# Patient Record
Sex: Male | Born: 1980
Health system: Southern US, Community
[De-identification: ages and names within clinical notes are randomized; demographics above are authoritative.]

## PROBLEM LIST (undated history)

## (undated) DIAGNOSIS — D649 Anemia, unspecified: Secondary | ICD-10-CM

## (undated) DIAGNOSIS — F410 Panic disorder [episodic paroxysmal anxiety] without agoraphobia: Secondary | ICD-10-CM

## (undated) DIAGNOSIS — I10 Essential (primary) hypertension: Secondary | ICD-10-CM

## (undated) DIAGNOSIS — F32A Depression, unspecified: Secondary | ICD-10-CM

## (undated) DIAGNOSIS — K859 Acute pancreatitis without necrosis or infection, unspecified: Secondary | ICD-10-CM

## (undated) DIAGNOSIS — K219 Gastro-esophageal reflux disease without esophagitis: Secondary | ICD-10-CM

## (undated) DIAGNOSIS — F329 Major depressive disorder, single episode, unspecified: Secondary | ICD-10-CM

## (undated) DIAGNOSIS — N179 Acute kidney failure, unspecified: Secondary | ICD-10-CM

## (undated) HISTORY — PX: UPPER GI ENDOSCOPY: SHX6162

## (undated) HISTORY — DX: Panic disorder (episodic paroxysmal anxiety): F41.0

## (undated) HISTORY — DX: Essential (primary) hypertension: I10

## (undated) NOTE — *Deleted (*Deleted)
Daily Rounding Note  07/29/2020, 10:24 AM  LOS: 1 day   SUBJECTIVE:   Chief complaint:     ***  OBJECTIVE:         Vital signs in last 24 hours:    Temp:  [98.4 F (36.9 C)-99.4 F (37.4 C)] 98.4 F (36.9 C) (12/01 0522) Pulse Rate:  [96-106] 96 (12/01 0600) Resp:  [18] 18 (12/01 0522) BP: (126-146)/(62-96) 127/62 (12/01 0600) SpO2:  [95 %-98 %] 98 % (12/01 0522)   Filed Weights   07/27/20 0858  Weight: 93.4 kg   General: ***   Heart: *** Chest: *** Abdomen: ***  Extremities: *** Neuro/Psych:  ***  Intake/Output from previous day: 11/30 0701 - 12/01 0700 In: 240 [P.O.:240] Out: 600 [Urine:600]  Intake/Output this shift: No intake/output data recorded.  Lab Results: Recent Labs    07/27/20 0915 07/27/20 0915 07/27/20 1226 07/28/20 0717 07/29/20 0254  WBC 8.0  --   --  7.9 9.9  HGB 18.4*   < > 17.7* 13.7 13.8  HCT 53.2*   < > 52.0 40.1 39.3  PLT 268  --   --  157 116*   < > = values in this interval not displayed.   BMET Recent Labs    07/27/20 0915 07/27/20 0915 07/27/20 1226 07/28/20 0717 07/29/20 0254  NA 135   < > 136 127* 134*  K 4.7   < > 5.7* 3.9 4.0  CL 102  --   --  97* 98  CO2 17*  --   --  23 25  GLUCOSE 165*  --   --  176* 185*  BUN 29*  --   --  12 7  CREATININE 1.59*  --   --  1.22 1.27*  CALCIUM 8.9  --   --  8.4* 8.7*   < > = values in this interval not displayed.   LFT Recent Labs    07/27/20 0915 07/28/20 0717 07/29/20 0254  PROT 7.3 5.9* 6.0*  ALBUMIN 4.1 3.3* 3.0*  AST 940* 513* 232*  ALT 492* 396* 249*  ALKPHOS 134* 91 81  BILITOT 2.2* 3.1* 2.9*  BILIDIR  --  1.4*  --    PT/INR Recent Labs    07/27/20 1428 07/28/20 0717  LABPROT 20.0* 16.0*  INR 1.8* 1.3*   Hepatitis Panel Recent Labs    07/27/20 1129 07/27/20 1129 07/27/20 1428  HEPBSAG NON REACTIVE  --   --   HCVAB NON REACTIVE   < > NON REACTIVE  HEPAIGM NON REACTIVE   < > NON  REACTIVE  HEPBIGM NON REACTIVE   < > NON REACTIVE   < > = values in this interval not displayed.    Studies/Results: CT Abdomen Pelvis W Contrast  Result Date: 07/27/2020 CLINICAL DATA:  Epigastric pain with nausea beginning around midnight last night. EXAM: CT ABDOMEN AND PELVIS WITH CONTRAST TECHNIQUE: Multidetector CT imaging of the abdomen and pelvis was performed using the standard protocol following bolus administration of intravenous contrast. CONTRAST:  OMNIPAQUE IOHEXOL 300 MG/ML  SOLN COMPARISON:  02/16/2018 FINDINGS: Lower chest: Clear lung bases.  Heart normal in size. Hepatobiliary: Liver normal in size. Diffuse decreased attenuation of the liver consistent with fatty infiltration. Fatty sparing noted adjacent to the gallbladder and anterior superior liver. No liver mass. Normal gallbladder. No bile duct dilation Pancreas: Cyst lies along the pancreatic tail adjacent to the spleen measuring 2.2 x 1.7 x 2.4  cm. No other pancreatic masses. Duct dilation or active inflammation. Spleen: Normal in size without focal abnormality. Adrenals/Urinary Tract: No adrenal masses. Kidneys normal in size, orientation and position with symmetric enhancement. Subtle low-attenuation area in the upper pole suggests a cysts common 9 mm in size. No other masses, no stones and no hydronephrosis. Normal ureters. Normal bladder. Stomach/Bowel: Stomach is within normal limits. Appendix appears normal. No evidence of bowel wall thickening, distention, or inflammatory changes. Vascular/Lymphatic: No significant vascular findings are present. No enlarged abdominal or pelvic lymph nodes. Reproductive: Unremarkable. Other: No abdominal wall hernia or abnormality. No abdominopelvic ascites. Musculoskeletal: No fracture or acute finding. No osteoblastic or osteolytic lesions. IMPRESSION: 1. No acute findings.  No evidence of active pancreatitis. 2. 2.4 cm cyst lies between the pancreatic tail and spleen consistent with a  pseudocyst given the patient's history of pancreatitis, new since the prior CT. Recommend follow-up CT in 6-12 months to assess for stability. 3. Extensive hepatic steatosis. Electronically Signed   By: Amie Portland M.D.   On: 07/27/2020 12:09   MR 3D Recon At Scanner  Result Date: 07/27/2020 CLINICAL DATA:  Fatty liver. EXAM: MRI ABDOMEN WITHOUT AND WITH CONTRAST (INCLUDING MRCP) TECHNIQUE: Multiplanar multisequence MR imaging of the abdomen was performed both before and after the administration of intravenous contrast. Heavily T2-weighted images of the biliary and pancreatic ducts were obtained, and three-dimensional MRCP images were rendered by post processing. CONTRAST:  9mL GADAVIST GADOBUTROL 1 MMOL/ML IV SOLN COMPARISON:  CT scan 07/27/2020 FINDINGS: Lower chest: Unremarkable. Hepatobiliary: Diffuse loss of signal intensity in the liver parenchyma on out of phase T1 imaging is consistent with fatty deposition. There is some sparing along the gallbladder fossa. There is no evidence for gallstones, gallbladder wall thickening, or pericholecystic fluid. No intrahepatic or extrahepatic biliary dilation. No choledocholithiasis. Pancreas: No focal mass lesion. No dilatation of the main duct. No peripancreatic edema. Cystic lesion at the tail of pancreas/splenic hilum is obscured by susceptibility artifact from metallic fragment in the left abdomen. Spleen:  Obscured. Adrenals/Urinary Tract: No adrenal nodule or mass. Right kidney unremarkable. Left kidney largely obscured by susceptibility artifact. Stomach/Bowel: Stomach is unremarkable. No gastric wall thickening. No evidence of outlet obstruction. Duodenum is normally positioned as is the ligament of Treitz. No small bowel or colonic dilatation within the visualized abdomen. Vascular/Lymphatic: No abdominal aortic aneurysm. There is no gastrohepatic or hepatoduodenal ligament lymphadenopathy. No retroperitoneal or mesenteric lymphadenopathy. Other:  No  intraperitoneal free fluid. Musculoskeletal: No focal suspicious marrow enhancement within the visualized bony anatomy. IMPRESSION: 1. Hepatic steatosis. 2. Cystic lesion at the tail of pancreas/splenic hilum seen on CT earlier today is obscured by susceptibility artifact from metallic fragment in the left abdomen. 3. No evidence for cholelithiasis.  No biliary dilation. Electronically Signed   By: Kennith Center M.D.   On: 07/27/2020 18:06   MR ABDOMEN MRCP W WO CONTAST  Result Date: 07/27/2020 CLINICAL DATA:  Fatty liver. EXAM: MRI ABDOMEN WITHOUT AND WITH CONTRAST (INCLUDING MRCP) TECHNIQUE: Multiplanar multisequence MR imaging of the abdomen was performed both before and after the administration of intravenous contrast. Heavily T2-weighted images of the biliary and pancreatic ducts were obtained, and three-dimensional MRCP images were rendered by post processing. CONTRAST:  9mL GADAVIST GADOBUTROL 1 MMOL/ML IV SOLN COMPARISON:  CT scan 07/27/2020 FINDINGS: Lower chest: Unremarkable. Hepatobiliary: Diffuse loss of signal intensity in the liver parenchyma on out of phase T1 imaging is consistent with fatty deposition. There is some sparing along the gallbladder fossa.  There is no evidence for gallstones, gallbladder wall thickening, or pericholecystic fluid. No intrahepatic or extrahepatic biliary dilation. No choledocholithiasis. Pancreas: No focal mass lesion. No dilatation of the main duct. No peripancreatic edema. Cystic lesion at the tail of pancreas/splenic hilum is obscured by susceptibility artifact from metallic fragment in the left abdomen. Spleen:  Obscured. Adrenals/Urinary Tract: No adrenal nodule or mass. Right kidney unremarkable. Left kidney largely obscured by susceptibility artifact. Stomach/Bowel: Stomach is unremarkable. No gastric wall thickening. No evidence of outlet obstruction. Duodenum is normally positioned as is the ligament of Treitz. No small bowel or colonic dilatation within the  visualized abdomen. Vascular/Lymphatic: No abdominal aortic aneurysm. There is no gastrohepatic or hepatoduodenal ligament lymphadenopathy. No retroperitoneal or mesenteric lymphadenopathy. Other:  No intraperitoneal free fluid. Musculoskeletal: No focal suspicious marrow enhancement within the visualized bony anatomy. IMPRESSION: 1. Hepatic steatosis. 2. Cystic lesion at the tail of pancreas/splenic hilum seen on CT earlier today is obscured by susceptibility artifact from metallic fragment in the left abdomen. 3. No evidence for cholelithiasis.  No biliary dilation. Electronically Signed   By: Kennith Center M.D.   On: 07/27/2020 18:06   US Abdomen Limited RUQ (LIVER/GB)  Result Date: 07/27/2020 CLINICAL DATA:  Elevated liver function tests. EXAM: ULTRASOUND ABDOMEN LIMITED RIGHT UPPER QUADRANT COMPARISON:  February 16, 2018. FINDINGS: Gallbladder: No gallstones or wall thickening visualized. No sonographic Murphy sign noted by sonographer. Common bile duct: Diameter: 4 mm which is within normal limits Liver: No focal lesion identified. Increased echogenicity of hepatic parenchyma is noted suggesting hepatic steatosis. Portal vein is patent on color Doppler imaging with normal direction of blood flow towards the liver. Other: None. IMPRESSION: Hepatic steatosis. No other definite abnormality seen in the right upper quadrant of the abdomen. Electronically Signed   By: Lupita Raider M.D.   On: 07/27/2020 11:41    ASSESMENT:   *    Abdominal pain.  Elevated LFTs.  Tail of pancreas pseudocyst. Transaminase elevation above levels consistent with alcoholic hepatitis alone. 11/29 MRCP showing hepatic steatosis, pancreatic pseudocyst, no cholelithiasis. Suspect patient drinks more than he admits to, was drinking more than usual over Thanksgiving holiday in the week prior to admission. T bili, transaminases continue to improve.  Lipase 56 at admission. Smooth muscle antibody negative.  ANA negative.  Hep ABC  viral serologies negative. Immunoglobulins, QN, A/E/G/M are pending.  *    Hyponatremia.  Improved, 127 >> 134.  *     Coagulopathy.  INR 1.8 >> vitamin K >> 1.3.  *     Thrombocytopenia.  Started at 268 >> 116 today.  *    Panic disorder, multiple meds in place.   PLAN   *    Dr. Judie Petit considering the role of EGD/EUS to rule out microcholedocholithiasis, microlithiasis.  However no firm plans for this to be done as an inpatient.    Jennye Moccasin  07/29/2020, 10:24 AM Phone [phone]

---
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
## 2000-04-07 ENCOUNTER — Encounter: Payer: Self-pay | Admitting: Internal Medicine

## 2000-04-07 ENCOUNTER — Encounter: Admission: RE | Admit: 2000-04-07 | Discharge: 2000-04-07 | Payer: Self-pay | Admitting: Internal Medicine

## 2000-08-29 HISTORY — PX: ABDOMINAL EXPLORATION SURGERY: SHX538

## 2001-06-09 ENCOUNTER — Inpatient Hospital Stay (HOSPITAL_COMMUNITY): Admission: EM | Admit: 2001-06-09 | Discharge: 2001-06-14 | Payer: Self-pay | Admitting: Emergency Medicine

## 2001-06-09 ENCOUNTER — Encounter: Payer: Self-pay | Admitting: Emergency Medicine

## 2001-06-09 ENCOUNTER — Encounter: Payer: Self-pay | Admitting: Surgery

## 2001-06-10 ENCOUNTER — Encounter: Payer: Self-pay | Admitting: Surgery

## 2012-08-21 ENCOUNTER — Encounter (HOSPITAL_COMMUNITY): Payer: Self-pay | Admitting: Emergency Medicine

## 2012-08-21 ENCOUNTER — Emergency Department (HOSPITAL_COMMUNITY): Payer: 59

## 2012-08-21 ENCOUNTER — Emergency Department (HOSPITAL_COMMUNITY)
Admission: EM | Admit: 2012-08-21 | Discharge: 2012-08-21 | Disposition: A | Discharge status: Home / Self Care | Payer: 59 | Attending: Emergency Medicine | Admitting: Emergency Medicine

## 2012-08-21 DIAGNOSIS — R509 Fever, unspecified: Secondary | ICD-10-CM | POA: Insufficient documentation

## 2012-08-21 DIAGNOSIS — K7689 Other specified diseases of liver: Secondary | ICD-10-CM | POA: Insufficient documentation

## 2012-08-21 DIAGNOSIS — R630 Anorexia: Secondary | ICD-10-CM | POA: Insufficient documentation

## 2012-08-21 DIAGNOSIS — K760 Fatty (change of) liver, not elsewhere classified: Secondary | ICD-10-CM

## 2012-08-21 DIAGNOSIS — A088 Other specified intestinal infections: Secondary | ICD-10-CM | POA: Insufficient documentation

## 2012-08-21 DIAGNOSIS — R112 Nausea with vomiting, unspecified: Secondary | ICD-10-CM | POA: Insufficient documentation

## 2012-08-21 DIAGNOSIS — A084 Viral intestinal infection, unspecified: Secondary | ICD-10-CM

## 2012-08-21 LAB — COMPREHENSIVE METABOLIC PANEL
ALT: 33 U/L (ref 0–53)
AST: 26 U/L (ref 0–37)
Alkaline Phosphatase: 67 U/L (ref 39–117)
GFR calc Af Amer: >90 mL/min (ref >90)
Glucose, Bld: 96 mg/dL (ref 70–99)
Potassium: 4.2 mEq/L (ref 3.5–5.1)
Sodium: 140 mEq/L (ref 135–145)
Total Protein: 7.5 g/dL (ref 6.0–8.3)

## 2012-08-21 LAB — CBC WITH DIFFERENTIAL/PLATELET
Basophils Absolute: 0 10*3/uL (ref 0.0–0.1)
Basophils Relative: 0 % (ref 0–1)
Eosinophils Absolute: 0.2 10*3/uL (ref 0.0–0.7)
Eosinophils Relative: 2 % (ref 0–5)
HCT: 49 % (ref 39.0–52.0)
Hemoglobin: 17 g/dL (ref 13.0–17.0)
Lymphocytes Relative: 32 % (ref 12–46)
Lymphs Abs: 2.8 10*3/uL (ref 0.7–4.0)
MCH: 29.6 pg (ref 26.0–34.0)
MCHC: 34.7 g/dL (ref 30.0–36.0)
MCV: 85.4 fL (ref 78.0–100.0)
Monocytes Absolute: 0.6 10*3/uL (ref 0.1–1.0)
Monocytes Relative: 7 % (ref 3–12)
Neutro Abs: 5.1 10*3/uL (ref 1.7–7.7)
Neutrophils Relative %: 59 % (ref 43–77)
Platelets: 228 10*3/uL (ref 150–400)
RBC: 5.74 MIL/uL (ref 4.22–5.81)
RDW: 12.8 % (ref 11.5–15.5)
WBC: 8.7 10*3/uL (ref 4.0–10.5)

## 2012-08-21 MED ORDER — KETOROLAC TROMETHAMINE 30 MG/ML IJ SOLN
30.0000 mg | Freq: Once | INTRAMUSCULAR | Status: AC
  Administered 2012-08-21: 30 mg via INTRAVENOUS
  Filled 2012-08-21: qty 1

## 2012-08-21 MED ORDER — ONDANSETRON HCL 4 MG PO TABS: 4.0000 mg | ORAL_TABLET | Freq: Four times a day (QID) | ORAL | Status: DC

## 2012-08-21 MED ORDER — SODIUM CHLORIDE 0.9 % IV BOLUS (SEPSIS)
1000.0000 mL | Freq: Once | INTRAVENOUS | Status: AC
  Administered 2012-08-21: 1000 mL via INTRAVENOUS

## 2012-08-21 NOTE — ED Notes (Signed)
Discharge instructions reviewed. Pt verbalized understanding.  

## 2012-08-21 NOTE — ED Notes (Signed)
Patient transported to Ultrasound 

## 2012-08-21 NOTE — ED Notes (Addendum)
Pt states that he is having mid abd pain since Sunday. Describes it as "Like Knives" rates pain 7/10. Denies diarrhea, but states he vomited x 1 in last 24hrs. Pt states he has taken ibuprofen for pain.

## 2012-08-21 NOTE — ED Provider Notes (Signed)
Medical screening examination/treatment/procedure(s) were performed by non-physician practitioner and as supervising physician I was immediately available for consultation/collaboration.  Shelda Jakes, MD 08/21/12 1131

## 2012-08-21 NOTE — ED Provider Notes (Signed)
History     CSN: [id number]  Arrival date & time 08/21/12  [id number]   First MD Initiated Contact with Patient 08/21/12 [phone]      Chief Complaint  Patient presents with  . Abdominal Pain    (Consider location/radiation/quality/duration/timing/severity/associated sxs/prior treatment) HPI Comments: 31 year old male presents to the emergency department with his dad complaining of abdominal pain since Sunday. Pain is located near his bellybutton and radiates to the mid epigastric region. Describes the pain as cramping, intermittent rated 7/10. Admits to associated fever on Sunday relieved by ibuprofen. Ibuprofen has not relieved his abdominal pain. He was nauseous and vomiting the past few days. Currently not nauseated. He has not eaten anything since 5:00 last night. Denies diarrhea. No sick contacts. He still has his gallbladder and appendix. Admits to drinking 1-2 beers every other night along with more the weekend. His grandfather has history of gallstones causing gallstone pancreatitis.  Patient is a 31 y.o. male presenting with abdominal pain. The history is provided by the patient and a parent.  Abdominal Pain The primary symptoms of the illness include abdominal pain, fever, nausea and vomiting. The primary symptoms of the illness do not include shortness of breath or diarrhea.  Symptoms associated with the illness do not include constipation.    No past medical history on file.  No past surgical history on file.  No family history on file.  History  Substance Use Topics  . Smoking status: Not on file  . Smokeless tobacco: Not on file  . Alcohol Use: Not on file      Review of Systems  Constitutional: Positive for fever and appetite change.  HENT: Negative.   Respiratory: Negative for shortness of breath.   Cardiovascular: Negative for chest pain.  Gastrointestinal: Positive for nausea, vomiting and abdominal pain. Negative for diarrhea and constipation.  Musculoskeletal:  Negative.   All other systems reviewed and are negative.    Allergies  Morphine and related  Home Medications   Current Outpatient Rx  Name  Route  Sig  Dispense  Refill  . IBUPROFEN 200 MG PO TABS   Oral   Take 400 mg by mouth every 6 (six) hours as needed. For pain/fever           There were no vitals taken for this visit.  Physical Exam  Constitutional: He is oriented to person, place, and time. He appears well-developed and well-nourished. No distress.  HENT:  Head: Normocephalic and atraumatic.  Mouth/Throat: Oropharynx is clear and moist.  Eyes: Conjunctivae normal and EOM are normal. Pupils are equal, round, and reactive to light. No scleral icterus.  Neck: Normal range of motion. Neck supple.  Cardiovascular: Normal rate, regular rhythm and normal heart sounds.   Pulmonary/Chest: Effort normal and breath sounds normal.  Abdominal: Soft. Normal appearance and bowel sounds are normal. There is tenderness in the epigastric area and periumbilical area. There is guarding. There is no rigidity and no rebound.       No peritoneal signs.  Musculoskeletal: Normal range of motion. He exhibits no edema.  Neurological: He is alert and oriented to person, place, and time.  Skin: Skin is warm and dry.  Psychiatric: He has a normal mood and affect. His behavior is normal.    ED Course  Procedures (including critical care time)   Labs Reviewed  CBC WITH DIFFERENTIAL  COMPREHENSIVE METABOLIC PANEL  LIPASE, BLOOD   US Abdomen Complete  08/21/2012  *RADIOLOGY REPORT*  Abdominal ultrasound  History:  Abdominal pain with nausea and vomiting  Findings:  Gallbladder is visualized in multiple projections. There are no gallstones, gallbladder wall thickening, or pericholecystic fluid collection.  There is no intrahepatic, common hepatic, or common bile duct dilatation.  Pancreas is partially obscured by gas.  Visualized portions of pancreas appear normal.  There is diffuse increased  echogenicity in the liver consistent with fatty change.  No focal liver lesions are identified.  Spleen is normal in size and homogeneous echotexture.  Kidneys bilaterally appear normal.  There is no ascites.  Aorta is nonaneurysmal.  Inferior vena cava appears normal.  Conclusion:  Diffuse fatty liver.  While no focal liver lesions are identified, it must be cautioned that the sensitivity of ultrasound for more subtle liver lesions is diminished given underlying fatty change.  Portions of pancreas are obscured by gas.  Visualized portions appear normal.  Study otherwise unremarkable.   Original Report Authenticated By: Bretta Bang, M.D.      1. Viral gastroenteritis   2. Fatty liver       MDM  31 y/o male with viral gastroenteritis and fatty liver. Labs unremarkable. Patient comfortable in room and in NAD. Toradol relieved abdominal pain. Less tender to palpation. Advised bland diet and avoid alcohol. He will f/u with GI to discuss fatty liver.         Trevor Mace, PA-C 08/21/12 1122

## 2012-12-10 ENCOUNTER — Other Ambulatory Visit: Payer: Self-pay | Admitting: Occupational Medicine

## 2012-12-10 ENCOUNTER — Ambulatory Visit: Payer: Self-pay

## 2012-12-10 DIAGNOSIS — R52 Pain, unspecified: Secondary | ICD-10-CM

## 2013-11-29 ENCOUNTER — Encounter (HOSPITAL_COMMUNITY): Payer: Self-pay | Admitting: Emergency Medicine

## 2013-11-29 ENCOUNTER — Emergency Department (HOSPITAL_COMMUNITY)
Admission: EM | Admit: 2013-11-29 | Discharge: 2013-11-29 | Disposition: A | Discharge status: Home / Self Care | Payer: 59 | Source: Home / Self Care | Attending: Family Medicine | Admitting: Family Medicine

## 2013-11-29 ENCOUNTER — Ambulatory Visit (HOSPITAL_COMMUNITY)
Admission: RE | Admit: 2013-11-29 | Discharge: 2013-11-29 | Disposition: A | Discharge status: Home / Self Care | Payer: 59 | Attending: Psychiatry | Admitting: Psychiatry

## 2013-11-29 DIAGNOSIS — F411 Generalized anxiety disorder: Secondary | ICD-10-CM

## 2013-11-29 MED ORDER — VENLAFAXINE HCL ER 37.5 MG PO CP24: 37.5000 mg | ORAL_CAPSULE | Freq: Every day | ORAL | Status: DC

## 2013-11-29 MED ORDER — ALPRAZOLAM 0.5 MG PO TABS: 0.5000 mg | ORAL_TABLET | Freq: Two times a day (BID) | ORAL | Status: DC | PRN

## 2013-11-29 NOTE — ED Notes (Signed)
Pt  Reports  He  Was  Seen  This  Am  At   [facility]         Was  referred    To an  Urgent care           For possible  meds for  Anxiety           Pt  denys  Any  suicidal  Ideations  denys   Hearing  Any  Voices      His  Eye  Contact  Is  Good    He  Reports  what  He  Describes  As  Recent panic  Attacks
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
## 2013-11-29 NOTE — Discharge Instructions (Signed)
Generalized Anxiety Disorder Generalized anxiety disorder (GAD) is a mental disorder. It interferes with life functions, including relationships, work, and school. GAD is different from normal anxiety, which everyone experiences at some point in their lives in response to specific life events and activities. Normal anxiety actually helps [place] prepare for and get through these life events and activities. Normal anxiety goes away after the event or activity is over.  GAD causes anxiety that is not necessarily related to specific events or activities. It also causes excess anxiety in proportion to specific events or activities. The anxiety associated with GAD is also difficult to control. GAD can vary from mild to severe. People with severe GAD can have intense waves of anxiety with physical symptoms (panic attacks).  SYMPTOMS The anxiety and worry associated with GAD are difficult to control. This anxiety and worry are related to many life events and activities and also occur more days than not for 6 months or longer. People with GAD also have three or more of the following symptoms (one or more in children):  Restlessness.   Fatigue.  Difficulty concentrating.   Irritability.  Muscle tension.  Difficulty sleeping or unsatisfying sleep. DIAGNOSIS GAD is diagnosed through an assessment by your caregiver. Your caregiver will ask you questions aboutyour mood,physical symptoms, and events in your life. Your caregiver may ask you about your medical history and use of alcohol or drugs, including prescription medications. Your caregiver may also do a physical exam and blood tests. Certain medical conditions and the use of certain substances can cause symptoms similar to those associated with GAD. Your caregiver may refer you to a mental health specialist for further evaluation. TREATMENT The following therapies are usually used to treat GAD:   Medication Antidepressant medication usually is  prescribed for long-term daily control. Antianxiety medications may be added in severe cases, especially when panic attacks occur.   Talk therapy (psychotherapy) Certain types of talk therapy can be helpful in treating GAD by providing support, education, and guidance. A form of talk therapy called cognitive behavioral therapy can teach you healthy ways to think about and react to daily life events and activities.  Stress managementtechniques These include yoga, meditation, and exercise and can be very helpful when they are practiced regularly. A mental health specialist can help determine which treatment is best for you. Some people see improvement with one therapy. However, other people require a combination of therapies. Document Released: 12/10/2012 Document Reviewed: 12/10/2012 [facility] Patient Information 2014 [place], [place].  Panic Attacks Panic attacks are sudden, short-livedsurges of severe anxiety, fear, or discomfort. They may occur for no reason when you are relaxed, when you are anxious, or when you are sleeping. Panic attacks may occur for a number of reasons:   Healthy people occasionally have panic attacks in extreme, life-threatening situations, such as war or natural disasters. Normal anxiety is a protective mechanism of the body that helps [place] react to danger (fight or flight response).  Panic attacks are often seen with anxiety disorders, such as panic disorder, social anxiety disorder, generalized anxiety disorder, and phobias. Anxiety disorders cause excessive or uncontrollable anxiety. They may interfere with your relationships or other life activities.  Panic attacks are sometimes seen with other mental illnesses such as depression and posttraumatic stress disorder.  Certain medical conditions, prescription medicines, and drugs of abuse can cause panic attacks. SYMPTOMS  Panic attacks start suddenly, peak within 20 minutes, and are accompanied by four or more of the  following symptoms:  Pounding  heart or fast heart rate (palpitations).  Sweating.  Trembling or shaking.  Shortness of breath or feeling smothered.  Feeling choked.  Chest pain or discomfort.  Nausea or strange feeling in your stomach.  Dizziness, lightheadedness, or feeling like you will faint.  Chills or hot flushes.  Numbness or tingling in your lips or hands and feet.  Feeling that things are not real or feeling that you are not yourself.  Fear of losing control or going crazy.  Fear of dying. Some of these symptoms can mimic serious medical conditions. For example, you may think you are having a heart attack. Although panic attacks can be very scary, they are not life threatening. DIAGNOSIS  Panic attacks are diagnosed through an assessment by your health care provider. Your health care provider will ask questions about your symptoms, such as where and when they occurred. Your health care provider will also ask about your medical history and use of alcohol and drugs, including prescription medicines. Your health care provider may order blood tests or other studies to rule out a serious medical condition. Your health care provider may refer you to a mental health professional for further evaluation. TREATMENT   Most healthy people who have one or two panic attacks in an extreme, life-threatening situation will not require treatment.  The treatment for panic attacks associated with anxiety disorders or other mental illness typically involves counseling with a mental health professional, medicine, or a combination of both. Your health care provider will help determine what treatment is best for you.  Panic attacks due to physical illness usually goes away with treatment of the illness. If prescription medicine is causing panic attacks, talk with your health care provider about stopping the medicine, decreasing the dose, or substituting another medicine.  Panic attacks due to  alcohol or drug abuse goes away with abstinence. Some adults need professional help in order to stop drinking or using drugs. HOME CARE INSTRUCTIONS   Take all your medicines as prescribed.   Check with your health care provider before starting new prescription or over-the-counter medicines.  Keep all follow up appointments with your health care provider. SEEK MEDICAL CARE IF:  You are not able to take your medicines as prescribed.  Your symptoms do not improve or get worse. SEEK IMMEDIATE MEDICAL CARE IF:   You experience panic attack symptoms that are different than your usual symptoms.  You have serious thoughts about hurting yourself or others.  You are taking medicine for panic attacks and have a serious side effect. MAKE SURE YOU:  Understand these instructions.  Will watch your condition.  Will get help right away if you are not doing well or get worse. Document Released: 08/15/2005 Document Revised: 06/05/2013 Document Reviewed: 03/29/2013 [facility] Patient Information 2014 [place].

## 2013-11-30 NOTE — ED Provider Notes (Signed)
Medical screening examination/treatment/procedure(s) were performed by resident physician or non-physician practitioner and as supervising physician I was immediately available for consultation/collaboration.   Pauline Good MD.   Billy Fischer, MD 11/30/13 [phone]

## 2013-12-10 ENCOUNTER — Encounter: Payer: Self-pay | Admitting: Family Medicine

## 2013-12-10 ENCOUNTER — Ambulatory Visit (INDEPENDENT_AMBULATORY_CARE_PROVIDER_SITE_OTHER): Payer: 59 | Admitting: Family Medicine

## 2013-12-10 VITALS — BP 170/110 | Temp 98.3°F | Ht 68.0 in | Wt 220.0 lb

## 2013-12-10 DIAGNOSIS — IMO0001 Reserved for inherently not codable concepts without codable children: Secondary | ICD-10-CM

## 2013-12-10 DIAGNOSIS — F410 Panic disorder [episodic paroxysmal anxiety] without agoraphobia: Secondary | ICD-10-CM | POA: Insufficient documentation

## 2013-12-10 DIAGNOSIS — R030 Elevated blood-pressure reading, without diagnosis of hypertension: Secondary | ICD-10-CM

## 2013-12-10 MED ORDER — VENLAFAXINE HCL ER 75 MG PO CP24: 75.0000 mg | ORAL_CAPSULE | Freq: Every day | ORAL | Status: DC

## 2013-12-10 MED ORDER — LORAZEPAM 0.5 MG PO TABS: 0.5000 mg | ORAL_TABLET | Freq: Two times a day (BID) | ORAL | Status: DC | PRN

## 2013-12-10 NOTE — Patient Instructions (Signed)
Effexor 75 mg.........Marland Kitchen 1 daily in the morning  Ativan 0.5..........Marland Kitchen 1 as needed  Blood pressure checked daily in the morning...........omron digital pump up blood pressure cuff  Return in 5 weeks with a record of all your blood pressure readings and the device

## 2013-12-10 NOTE — Progress Notes (Signed)
Pre visit review using our clinic review tool, if applicable. No additional management support is needed unless otherwise documented below in the visit note. 

## 2013-12-10 NOTE — Progress Notes (Signed)
   Subjective:    Patient ID: Glenn [name], male    DOB: 1981-03-02, 33 y.o.   MRN: [id number]  HPI Abdomen is a 33 year old single male nonsmoker who comes in today as a new patient  His father is a long-term patient of mine [employer].  Dad called me a couple weeks ago his son was having issues with anxiety attacks. I initially sent him to behavior health. They 7 urgent care and start him on Effexor 37.5 mg daily. He says is about 40% better and no side effects to medication  His blood pressure today is 170/110. His blood pressure at home is normal. Repeat by me 160/100.  Positive family history of hypertension   Review of Systems    negative Objective:   Physical Exam Well-developed and nourished male no acute distress vital signs stable he is afebrile BP 170/110 initially........Marland Kitchen repeat by me 160/100       Assessment & Plan:  Question hypertension plan BP check daily followup in 5 weeks  Panic attacks............. increase Effexor 75 mg daily,,,,,,,, Ativan 0.5 when necessary,,,,, return in 5 weeks

## 2014-01-14 ENCOUNTER — Encounter: Payer: Self-pay | Admitting: Family Medicine

## 2014-01-14 ENCOUNTER — Ambulatory Visit (INDEPENDENT_AMBULATORY_CARE_PROVIDER_SITE_OTHER): Payer: 59 | Admitting: Family Medicine

## 2014-01-14 VITALS — BP 140/100 | Temp 98.4°F | Wt 216.0 lb

## 2014-01-14 DIAGNOSIS — IMO0001 Reserved for inherently not codable concepts without codable children: Secondary | ICD-10-CM

## 2014-01-14 DIAGNOSIS — R030 Elevated blood-pressure reading, without diagnosis of hypertension: Secondary | ICD-10-CM

## 2014-01-14 DIAGNOSIS — F410 Panic disorder [episodic paroxysmal anxiety] without agoraphobia: Secondary | ICD-10-CM

## 2014-01-14 MED ORDER — LORAZEPAM 0.5 MG PO TABS: ORAL_TABLET | ORAL | Status: DC

## 2014-01-14 NOTE — Progress Notes (Signed)
Pre visit review using our clinic review tool, if applicable. No additional management support is needed unless otherwise documented below in the visit note. 

## 2014-01-14 NOTE — Patient Instructions (Signed)
Stop the Effexor  Ativan 0.5,,,,,,,,,, 13 times daily  Call Dr. Sheralyn Boatman [phone] and asked to be seen as soon as possible  If your anxiety gets worse in you cannot get an appointment to see Dr. Caprice Beaver then I would recommend[facility],  Check your blood pressure daily in the morning,,,,,,,,omron digital pump up blood pressure cuff,,,,,,,, return in 4 weeks for followup. When you return bring a copy of all your blood pressure readings and the device

## 2014-01-14 NOTE — Progress Notes (Signed)
   Subjective:    Patient ID: Glenn [name], male    DOB: 12/15/1980, 34 y.o.   MRN: [id number]  HPI Is a 33 year old male nonsmoker who comes in today for followup of anxiety attacks  We saw him a while back and put him on Effexor and increase it to 75 mg daily after a month. His symptoms got worse. He's also been on Ativan 0.5 twice a day.  His blood pressures been elevated but I think is probably related to anxiety. Today is 140/100  Also side effects to medication and no ejaculation  He's been monitoring his blood pressure at home. His systolics are in the 078 the 675 range diastolics from 44-92 range   Review of Systems    review of systems otherwise negative Objective:   Physical Exam  Well-developed well-nourished in no acute distress vital signs stable he is afebrile except for BP 140/100      Assessment & Plan:  Elevated blood pressure,,,,,,,,, question secondary to anxiety,,,,,,,, BP check at home followup in 4 weeks here  Anxiety unresponsive to medication....... consult with Dr. Caprice Beaver,

## 2014-02-17 ENCOUNTER — Ambulatory Visit: Payer: Self-pay | Admitting: Family Medicine

## 2014-02-18 ENCOUNTER — Encounter: Payer: Self-pay | Admitting: Family Medicine

## 2014-02-18 ENCOUNTER — Ambulatory Visit (INDEPENDENT_AMBULATORY_CARE_PROVIDER_SITE_OTHER): Payer: 59 | Admitting: Family Medicine

## 2014-02-18 VITALS — BP 150/100 | Temp 98.7°F | Wt 213.0 lb

## 2014-02-18 DIAGNOSIS — F410 Panic disorder [episodic paroxysmal anxiety] without agoraphobia: Secondary | ICD-10-CM

## 2014-02-18 DIAGNOSIS — I10 Essential (primary) hypertension: Secondary | ICD-10-CM | POA: Insufficient documentation

## 2014-02-18 LAB — CBC WITH DIFFERENTIAL/PLATELET
BASOS PCT: 0.3 % (ref 0.0–3.0)
Basophils Absolute: 0 10*3/uL (ref 0.0–0.1)
EOS PCT: 1.5 % (ref 0.0–5.0)
Eosinophils Absolute: 0.2 10*3/uL (ref 0.0–0.7)
HCT: 48.1 % (ref 39.0–52.0)
Hemoglobin: 16.2 g/dL (ref 13.0–17.0)
Lymphocytes Relative: 26.5 % (ref 12.0–46.0)
Lymphs Abs: 2.9 10*3/uL (ref 0.7–4.0)
MCHC: 33.8 g/dL (ref 30.0–36.0)
MCV: 76.9 fl — ABNORMAL LOW (ref 78.0–100.0)
MONOS PCT: 6.5 % (ref 3.0–12.0)
Monocytes Absolute: 0.7 10*3/uL (ref 0.1–1.0)
NEUTROS PCT: 65.2 % (ref 43.0–77.0)
Neutro Abs: 7.2 10*3/uL (ref 1.4–7.7)
PLATELETS: 322 10*3/uL (ref 150.0–400.0)
RBC: 6.25 Mil/uL — AB (ref 4.22–5.81)
RDW: 17.5 % — ABNORMAL HIGH (ref 11.5–15.5)
WBC: 11.1 10*3/uL — AB (ref 4.0–10.5)

## 2014-02-18 LAB — BASIC METABOLIC PANEL
BUN: 19 mg/dL (ref 6–23)
CHLORIDE: 99 meq/L (ref 96–112)
CO2: 32 mEq/L (ref 19–32)
CREATININE: 1.4 mg/dL (ref 0.4–1.5)
Calcium: 9.6 mg/dL (ref 8.4–10.5)
GFR: 63.72 mL/min (ref >60.00)
Glucose, Bld: 104 mg/dL — ABNORMAL HIGH (ref 70–99)
POTASSIUM: 4.5 meq/L (ref 3.5–5.1)
SODIUM: 139 meq/L (ref 135–145)

## 2014-02-18 LAB — POCT URINALYSIS DIPSTICK
BILIRUBIN UA: NEGATIVE
Blood, UA: NEGATIVE
GLUCOSE UA: NEGATIVE
KETONES UA: NEGATIVE
LEUKOCYTES UA: NEGATIVE
Nitrite, UA: NEGATIVE
Protein, UA: NEGATIVE
Urobilinogen, UA: 0.2
pH, UA: 5.5

## 2014-02-18 LAB — TSH: TSH: 1.63 u[IU]/mL (ref 0.35–4.50)

## 2014-02-18 MED ORDER — LOSARTAN POTASSIUM 25 MG PO TABS: 25.0000 mg | ORAL_TABLET | Freq: Every day | ORAL | Status: DC

## 2014-02-18 MED ORDER — CITALOPRAM HYDROBROMIDE 10 MG PO TABS: 10.0000 mg | ORAL_TABLET | Freq: Every day | ORAL | Status: DC

## 2014-02-18 MED ORDER — LORAZEPAM 0.5 MG PO TABS: ORAL_TABLET | ORAL | Status: DC

## 2014-02-18 NOTE — Progress Notes (Signed)
Pre visit review using our clinic review tool, if applicable. No additional management support is needed unless otherwise documented below in the visit note. 

## 2014-02-18 NOTE — Progress Notes (Signed)
   Subjective:    Patient ID: Glenn [name], male    DOB: June 29, 1981, 33 y.o.   MRN: [id number]  HPI Glenn [name]  is a 33 year old male who comes in today for followup of 2 problems  He's been monitoring his blood pressure at home is running 150/100. His father has hypertension  He also has a history of panic attacks we've tried him on Effexor but he had side effects he had to stop it. He takes an occasional Ativan. He called to see Dr. Caprice Beaver but she has no open appointments until September     Review of Systems     Review of systems otherwise negative Objective:   Physical Exam  Well-developed well-nourished male in no acute distress vital signs stable he is afebrile except for BP 150/100      Assessment & Plan:  Hypertension....... check labs.......Marland Kitchen begin Cozaar 25 mg daily  Anxiety........... Celexa 10 mg each bedtime.......Marland Kitchen Ativan 0.5 twice a day when necessary.

## 2014-02-18 NOTE — Patient Instructions (Signed)
Begin Celexa 10 mg.........Marland Kitchen 1 at bedtime  Ativan 0.5 mg............ one twice daily when necessary  Cozaar 25 mg........... one tablet daily in the morning for blood pressure control.........Marland Kitchen blood pressure goal 135/85 or less  Followup in 6 weeks  Labs today  Check your blood pressure daily in the morning.

## 2014-02-19 ENCOUNTER — Telehealth: Payer: Self-pay | Admitting: Family Medicine

## 2014-02-19 NOTE — Telephone Encounter (Signed)
Relevant patient education mailed to patient.  

## 2014-04-07 ENCOUNTER — Encounter: Payer: Self-pay | Admitting: Family Medicine

## 2014-04-07 ENCOUNTER — Ambulatory Visit (INDEPENDENT_AMBULATORY_CARE_PROVIDER_SITE_OTHER): Payer: 59 | Admitting: Family Medicine

## 2014-04-07 VITALS — BP 140/90 | Temp 98.8°F | Wt 212.0 lb

## 2014-04-07 DIAGNOSIS — I10 Essential (primary) hypertension: Secondary | ICD-10-CM

## 2014-04-07 NOTE — Patient Instructions (Signed)
Continue current medications  Followup in March for a complete physical examination  Labs one week prior

## 2014-04-07 NOTE — Progress Notes (Signed)
Pre visit review using our clinic review tool, if applicable. No additional management support is needed unless otherwise documented below in the visit note. 

## 2014-04-07 NOTE — Progress Notes (Signed)
   Subjective:    Patient ID: Glenn [name], male    DOB: [birth year], 33 y.o.   MRN: [id number]  HPI Glenn [name] is a 33 year old male who comes in today for evaluation of hypertension  We started him on Cozaar 25 mg daily because he was intolerant of ACE inhibitors. BP at home 120/80  BP here at 2:30 in the afternoon 140/90 but all morning blood pressures are normal  He's also on Celexa daily 10 mg along with Ativan 0.5 twice a day for panic attacks doing well   Review of Systems    review of systems otherwise negative Objective:   Physical Exam Well-developed well-nourished male no acute distress vital signs stable he is afebrile BP 140/90 right arm sitting position       Assessment & Plan:  Hypertension at goal continue current therapy followup

## 2014-05-08 ENCOUNTER — Other Ambulatory Visit: Payer: Self-pay | Admitting: *Deleted

## 2014-05-08 DIAGNOSIS — F410 Panic disorder [episodic paroxysmal anxiety] without agoraphobia: Secondary | ICD-10-CM

## 2014-05-08 MED ORDER — CITALOPRAM HYDROBROMIDE 10 MG PO TABS: 10.0000 mg | ORAL_TABLET | Freq: Every day | ORAL | Status: DC

## 2015-01-19 ENCOUNTER — Ambulatory Visit (INDEPENDENT_AMBULATORY_CARE_PROVIDER_SITE_OTHER): Payer: 59 | Admitting: Family Medicine

## 2015-01-19 VITALS — BP 140/90 | Temp 98.1°F | Wt 208.0 lb

## 2015-01-19 DIAGNOSIS — F410 Panic disorder [episodic paroxysmal anxiety] without agoraphobia: Secondary | ICD-10-CM

## 2015-01-19 DIAGNOSIS — I10 Essential (primary) hypertension: Secondary | ICD-10-CM | POA: Diagnosis not present

## 2015-01-19 MED ORDER — LOSARTAN POTASSIUM 25 MG PO TABS: 25.0000 mg | ORAL_TABLET | Freq: Every day | ORAL | Status: DC

## 2015-01-19 MED ORDER — LORAZEPAM 0.5 MG PO TABS: ORAL_TABLET | ORAL | Status: DC

## 2015-01-19 NOTE — Progress Notes (Signed)
   Subjective:    Patient ID: Glenn [name], male    DOB: 1980-10-19, 34 y.o.   MRN: [id number]  HPI Glenn [name] is a 34 year old male who comes in today for follow-up of hypertension and panic attacks  We had tried him on Effexor that didn't seem to be helpful. We also tried Celexa he took a from month or 2 it seemed to make him feel worse so he stopped that. I didn't recommend he see Dr. Letta Moynahan in group however he called there and they are no longer on his health insurance.  He also has episodes of lightheadedness. He says he usually occurs in the morning when he stands up suddenly. He wonders if his blood pressures too low. On Cozaar 25 mg BP here in the afternoon is 140/90   Review of Systems    review of systems otherwise negative Objective:   Physical Exam Well-developed well-nourished male no acute distress vital signs stable he is afebrile       Assessment & Plan:  Panic attacks.......... check with insurance company to find out what psychiatrist the cover  Hypertension at goal on Cozaar 25 mg daily

## 2015-01-19 NOTE — Progress Notes (Signed)
Pre visit review using our clinic review tool, if applicable. No additional management support is needed unless otherwise documented below in the visit note. 

## 2015-01-19 NOTE — Patient Instructions (Signed)
Call your insurance company and find out what psychiatrist are available in this area  Continue blood pressure medication  Ativan 1 mg......... one half to one twice daily when necessary in the meantime

## 2015-02-17 ENCOUNTER — Other Ambulatory Visit (INDEPENDENT_AMBULATORY_CARE_PROVIDER_SITE_OTHER): Payer: 59

## 2015-02-17 DIAGNOSIS — Z0000 Encounter for general adult medical examination without abnormal findings: Secondary | ICD-10-CM | POA: Diagnosis not present

## 2015-02-17 LAB — HEPATIC FUNCTION PANEL
ALK PHOS: 57 U/L (ref 39–117)
ALT: 25 U/L (ref 0–53)
AST: 20 U/L (ref 0–37)
Albumin: 4.5 g/dL (ref 3.5–5.2)
BILIRUBIN DIRECT: 0.1 mg/dL (ref 0.0–0.3)
BILIRUBIN TOTAL: 0.5 mg/dL (ref 0.2–1.2)
Total Protein: 7.2 g/dL (ref 6.0–8.3)

## 2015-02-17 LAB — CBC WITH DIFFERENTIAL/PLATELET
BASOS ABS: 0 10*3/uL (ref 0.0–0.1)
BASOS PCT: 0.2 % (ref 0.0–3.0)
EOS ABS: 0.2 10*3/uL (ref 0.0–0.7)
Eosinophils Relative: 1.9 % (ref 0.0–5.0)
HCT: 45.6 % (ref 39.0–52.0)
Hemoglobin: 15.4 g/dL (ref 13.0–17.0)
LYMPHS PCT: 40.1 % (ref 12.0–46.0)
Lymphs Abs: 3.4 10*3/uL (ref 0.7–4.0)
MCHC: 33.8 g/dL (ref 30.0–36.0)
MCV: 84.7 fl (ref 78.0–100.0)
Monocytes Absolute: 0.5 10*3/uL (ref 0.1–1.0)
Monocytes Relative: 5.5 % (ref 3.0–12.0)
NEUTROS PCT: 52.3 % (ref 43.0–77.0)
Neutro Abs: 4.4 10*3/uL (ref 1.4–7.7)
PLATELETS: 273 10*3/uL (ref 150.0–400.0)
RBC: 5.39 Mil/uL (ref 4.22–5.81)
RDW: 13.4 % (ref 11.5–15.5)
WBC: 8.4 10*3/uL (ref 4.0–10.5)

## 2015-02-17 LAB — BASIC METABOLIC PANEL
BUN: 14 mg/dL (ref 6–23)
CHLORIDE: 104 meq/L (ref 96–112)
CO2: 26 meq/L (ref 19–32)
Calcium: 9.5 mg/dL (ref 8.4–10.5)
Creatinine, Ser: 1.03 mg/dL (ref 0.40–1.50)
GFR: 88.02 mL/min (ref >60.00)
GLUCOSE: 85 mg/dL (ref 70–99)
POTASSIUM: 4.5 meq/L (ref 3.5–5.1)
SODIUM: 138 meq/L (ref 135–145)

## 2015-02-17 LAB — LIPID PANEL
CHOLESTEROL: 196 mg/dL (ref 0–200)
HDL: 52.5 mg/dL (ref >39.00)
LDL Cholesterol: 127 mg/dL — ABNORMAL HIGH (ref 0–99)
NonHDL: 143.5
TRIGLYCERIDES: 83 mg/dL (ref 0.0–149.0)
Total CHOL/HDL Ratio: 4
VLDL: 16.6 mg/dL (ref 0.0–40.0)

## 2015-02-17 LAB — POCT URINALYSIS DIPSTICK
BILIRUBIN UA: NEGATIVE
Blood, UA: NEGATIVE
Glucose, UA: NEGATIVE
KETONES UA: NEGATIVE
LEUKOCYTES UA: NEGATIVE
Nitrite, UA: NEGATIVE
PH UA: 5.5
Protein, UA: NEGATIVE
SPEC GRAV UA: 1.025
Urobilinogen, UA: 0.2

## 2015-02-17 LAB — TSH: TSH: 1.11 u[IU]/mL (ref 0.35–4.50)

## 2015-02-24 ENCOUNTER — Ambulatory Visit (INDEPENDENT_AMBULATORY_CARE_PROVIDER_SITE_OTHER): Payer: 59 | Admitting: Family Medicine

## 2015-02-24 ENCOUNTER — Encounter: Payer: Self-pay | Admitting: Family Medicine

## 2015-02-24 VITALS — BP 158/96 | HR 108 | Temp 99.1°F | Wt 222.0 lb

## 2015-02-24 DIAGNOSIS — Z23 Encounter for immunization: Secondary | ICD-10-CM | POA: Diagnosis not present

## 2015-02-24 DIAGNOSIS — R000 Tachycardia, unspecified: Secondary | ICD-10-CM | POA: Diagnosis not present

## 2015-02-24 DIAGNOSIS — I10 Essential (primary) hypertension: Secondary | ICD-10-CM

## 2015-02-24 DIAGNOSIS — Z720 Tobacco use: Secondary | ICD-10-CM

## 2015-02-24 DIAGNOSIS — F410 Panic disorder [episodic paroxysmal anxiety] without agoraphobia: Secondary | ICD-10-CM

## 2015-02-24 DIAGNOSIS — Z0000 Encounter for general adult medical examination without abnormal findings: Secondary | ICD-10-CM

## 2015-02-24 NOTE — Assessment & Plan Note (Addendum)
S: Daily experiences shakiness, elevated HR with palpitations, feelings of anxiousness. Been going on for 1-2 years. Never had EKG with panic attack. Took an ativan before he came in. Ativan about 6 days a week. Xanax worked better but got very sleepy A/P: agree with psych visit. Celexa and effexor worsened symptoms. Continue prn ativan until that visit. Has 60 pills left. Baseline EKG today- does not appear to be arhythmia but then again HR had slowed by time of exam from >100 to 90.

## 2015-02-24 NOTE — Assessment & Plan Note (Signed)
poor control in office, excellent at home. Continue losartan. Bring cuff for visit 2-3 months and verify at that time. Panic attack likely contributing to higherBP today.

## 2015-02-24 NOTE — Patient Instructions (Addendum)
Tdap today  Agree finding a psychiatrist and trying to control anxiety better is a good idea  We did get a baseline EKG on you  Let's have you bring your home cuff with you in 2-3 months so we can verify your cuff and check in on your blood pressure (higher today than last few visits but suspect panic attack related)   I think you should see a dentist for long term health  Weight goal under 200. Great job with exercise  Advise you to quit dipping. This may help reduce panic attacks. Avoid caffeine as well if able
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
## 2015-04-24 ENCOUNTER — Ambulatory Visit: Payer: Self-pay | Admitting: Family Medicine

## 2015-05-13 ENCOUNTER — Encounter: Payer: Self-pay | Admitting: Family Medicine

## 2015-05-13 ENCOUNTER — Ambulatory Visit (INDEPENDENT_AMBULATORY_CARE_PROVIDER_SITE_OTHER): Payer: Commercial Managed Care - HMO | Admitting: Family Medicine

## 2015-05-13 VITALS — BP 140/84 | HR 80 | Temp 98.3°F | Wt 218.0 lb

## 2015-05-13 DIAGNOSIS — Z720 Tobacco use: Secondary | ICD-10-CM

## 2015-05-13 DIAGNOSIS — F410 Panic disorder [episodic paroxysmal anxiety] without agoraphobia: Secondary | ICD-10-CM

## 2015-05-13 DIAGNOSIS — I10 Essential (primary) hypertension: Secondary | ICD-10-CM | POA: Diagnosis not present

## 2015-05-13 MED ORDER — LOSARTAN POTASSIUM 100 MG PO TABS: 100.0000 mg | ORAL_TABLET | Freq: Every day | ORAL | Status: DC

## 2015-05-13 MED ORDER — LORAZEPAM 0.5 MG PO TABS: ORAL_TABLET | ORAL | Status: DC

## 2015-05-13 MED ORDER — LOSARTAN POTASSIUM 50 MG PO TABS: 100.0000 mg | ORAL_TABLET | Freq: Every day | ORAL | Status: DC

## 2015-05-13 NOTE — Assessment & Plan Note (Addendum)
S: Failed celexa and effexor- worsened anxiety. Xanax makes him sleepy. Ativan helps but not as much as xanax. Asks for other options. Has not found psych that accepts insurance. Using ativan about once a day now A/P: gave list from [facility] to help him find psychiatry assistance. Refilled ativan #60 with 1 refill to help with transition. Discussed preferred not to use xanax and would use klonopin more for control if taking BID but he prefers prn use. We will continue to assist him until he gets into psych- with antidepressant failure- needs psych care if monotherapy in benzos to be used

## 2015-05-13 NOTE — Assessment & Plan Note (Addendum)
Still dipping- not ready to quit but advised

## 2015-05-13 NOTE — Assessment & Plan Note (Addendum)
S: previously reported controlled at home. Home cuff readings now reported 140s/90s consistent with our readings. On losartan 25mg . Home cuff reads 147/107 and my reading 146/102 BP Readings from Last 3 Encounters:  05/13/15 140/84  02/24/15 158/96  01/19/15 140/90  A/P: increase losartan to 100mg . Home cuff accurate but home BPs as well as here are high. Increase exercise

## 2015-05-13 NOTE — Patient Instructions (Signed)
Home cuff looks good. Readings too high here and at home. Increase losartan to 100mg , see me in a month.   Refilled ativan. See list of providers that are supposed to call [facility]. Call and try to get in. I did give you 60 pills with 1 refill to give you some time to get in with them

## 2015-05-13 NOTE — Progress Notes (Signed)
[name], MD  Subjective:  Glenn [name] is a 34 y.o. year old very pleasant male patient who presents for/with See problem oriented charting ROS- admits to anxiety, no headache or blurry vision, no edema  Past Medical History-  Patient Active Problem List   Diagnosis Date Noted  . Tobacco abuse 02/24/2015    Priority: Medium  . Essential hypertension, benign 02/18/2014    Priority: Medium  . Panic attacks 12/10/2013    Priority: Medium    Medications- reviewed and updated Current Outpatient Prescriptions  Medication Sig Dispense Refill  . losartan (COZAAR) 50 MG tablet Take 1 tablets by mouth daily. 30 tablet 5  . LORazepam (ATIVAN) 0.5 MG tablet 1 by mouth 2 times daily when necessary 60 tablet 1   Objective: BP 140/84 mmHg  Pulse 80  Temp(Src) 98.3 F (36.8 C)  Wt 218 lb (98.884 kg) Gen: NAD, anxious appearing CV: RRR no murmurs rubs or gallops Lungs: CTAB no crackles, wheeze, rhonchi Abdomen: soft/nontender/nondistended/normal bowel sounds. No rebound or guarding.  Ext: no edema Skin: warm, dry Neuro: grossly normal, moves all extremities  Assessment/Plan:  Panic attacks S: Failed celexa and effexor- worsened anxiety. Xanax makes him sleepy. Ativan helps but not as much as xanax. Asks for other options. Has not found psych that accepts insurance. Using ativan about once a day now A/P: gave list from [facility] to help him find psychiatry assistance. Refilled ativan #60 with 1 refill to help with transition. Discussed preferred not to use xanax and would use klonopin more for control if taking BID but he prefers prn use. We will continue to assist him until he gets into psych- with antidepressant failure- needs psych care if monotherapy in benzos to be used   Essential hypertension, benign S: previously reported controlled at home. Home cuff readings now reported 140s/90s consistent with our readings. On losartan 25mg  BP Readings from Last 3 Encounters:   05/13/15 140/84  02/24/15 158/96  01/19/15 140/90  A/P: increase losartan to 100mg    Tobacco abuse Still dipping- not ready to quit but advised 1 month BP check  Meds ordered this encounter  Medications  . LORazepam (ATIVAN) 0.5 MG tablet    Sig: 1 by mouth 2 times daily when necessary    Dispense:  60 tablet    Refill:  1  . losartan (COZAAR) 50 MG tablet    Sig: Take 2 tablets (100 mg total) by mouth daily.    Dispense:  30 tablet    Refill:  5

## 2015-06-11 ENCOUNTER — Encounter: Payer: Self-pay | Admitting: Family Medicine

## 2015-06-11 ENCOUNTER — Ambulatory Visit (INDEPENDENT_AMBULATORY_CARE_PROVIDER_SITE_OTHER): Payer: Commercial Managed Care - HMO | Admitting: Family Medicine

## 2015-06-11 VITALS — BP 130/90 | HR 82 | Temp 98.2°F | Wt 218.0 lb

## 2015-06-11 DIAGNOSIS — I10 Essential (primary) hypertension: Secondary | ICD-10-CM

## 2015-06-11 DIAGNOSIS — F410 Panic disorder [episodic paroxysmal anxiety] without agoraphobia: Secondary | ICD-10-CM | POA: Diagnosis not present

## 2015-06-11 NOTE — Patient Instructions (Signed)
Blood pressure at goal at home Slightly high here but barely Continue same medicine at 100mg  losartan  Happy to refill the ativan until [facility] can see you in 4 months  Let us know if you need anything before then  6 months recheck

## 2015-06-11 NOTE — Assessment & Plan Note (Signed)
Advised to quit, not ready

## 2015-06-11 NOTE — Assessment & Plan Note (Signed)
S:Losartan up to 100mg  from 50mg . At home running 120/80s- home cuff verified last month. controlled BP Readings from Last 3 Encounters:  06/11/15 130/90  05/13/15 140/84  02/24/15 158/96  A/P: continue current rx, advised weight loss

## 2015-06-11 NOTE — Assessment & Plan Note (Signed)
S:Will be with [facility]. About 4 months to get in. PRN ativan with reasonable control A/P: told patient I would refill as needed until he can be seen by [facility]

## 2015-06-11 NOTE — Progress Notes (Signed)
[name], MD  Subjective:  Glenn [name] is a 34 y.o. year old very pleasant male patient who presents for/with See problem oriented charting ROS- no headache or blurry vision, no extremity weakness. Shortness of breatha nd chset pain with panic attacks.   Past Medical History-  Patient Active Problem List   Diagnosis Date Noted  . Tobacco abuse 02/24/2015    Priority: Medium  . Essential hypertension, benign 02/18/2014    Priority: Medium  . Panic attacks 12/10/2013    Priority: Medium    Medications- reviewed and updated Current Outpatient Prescriptions  Medication Sig Dispense Refill  . losartan (COZAAR) 100 MG tablet Take 1 tablet (100 mg total) by mouth daily. 30 tablet 5  . LORazepam (ATIVAN) 0.5 MG tablet 1 by mouth 2 times daily when necessary (Patient not taking: Reported on 06/11/2015) 60 tablet 1   No current facility-administered medications for this visit.    Objective: BP 130/90 mmHg  Pulse 82  Temp(Src) 98.2 F (36.8 C)  Wt 218 lb (98.884 kg) Gen: NAD, resting comfortably CV: RRR no murmurs rubs or gallops Lungs: CTAB no crackles, wheeze, rhonchi Abdomen: soft/nontender/nondistended/normal bowel sounds. No rebound or guarding.  Ext: no edema Skin: warm, dry Neuro: grossly normal, moves all extremities  Assessment/Plan:  Essential hypertension, benign S:Losartan up to 100mg  from 50mg . At home running 120/80s- home cuff verified last month. controlled BP Readings from Last 3 Encounters:  06/11/15 130/90  05/13/15 140/84  02/24/15 158/96  A/P: continue current rx, advised weight loss   Panic attacks S:Will be with [facility]. About 4 months to get in. PRN ativan with reasonable control A/P: told patient I would refill as needed until he can be seen by [facility]   Tobacco abuse Advised to quit, not ready   6 months advised, with continued home monitoring

## 2015-11-30 ENCOUNTER — Other Ambulatory Visit: Payer: Self-pay | Admitting: Family Medicine

## 2015-12-10 ENCOUNTER — Encounter: Payer: Self-pay | Admitting: Family Medicine

## 2015-12-10 ENCOUNTER — Ambulatory Visit (INDEPENDENT_AMBULATORY_CARE_PROVIDER_SITE_OTHER): Payer: Commercial Managed Care - HMO | Admitting: Family Medicine

## 2015-12-10 VITALS — BP 128/88 | HR 79 | Temp 98.3°F | Wt 220.0 lb

## 2015-12-10 DIAGNOSIS — I10 Essential (primary) hypertension: Secondary | ICD-10-CM | POA: Diagnosis not present

## 2015-12-10 DIAGNOSIS — F410 Panic disorder [episodic paroxysmal anxiety] without agoraphobia: Secondary | ICD-10-CM | POA: Diagnosis not present

## 2015-12-10 NOTE — Assessment & Plan Note (Signed)
S: much improved control through [facility]- advised to go there given failed SSRI attempts. On fluvoxamine and remeron. Also diazepam 10mg  TID prn- weaning down on amount A/P: continue current medication, very pleased he is getting appropriate care through psychiatry.

## 2015-12-10 NOTE — Patient Instructions (Signed)
Ask them to add HIV to your bloodwork before physical labs  Blood pressure looks great today and looks even better at home. Continue losartan 100mg 

## 2015-12-10 NOTE — Progress Notes (Signed)
[name], MD  Subjective:  Glenn [name] is a 35 y.o. year old very pleasant male patient who presents for/with See problem oriented charting ROS- No chest pain or shortness of breath. No headache or blurry vision.   Past Medical History-  Patient Active Problem List   Diagnosis Date Noted  . Tobacco abuse 02/24/2015    Priority: Medium  . Essential hypertension, benign 02/18/2014    Priority: Medium  . Panic attacks 12/10/2013    Priority: Medium    Medications- reviewed and updated Current Outpatient Prescriptions  Medication Sig Dispense Refill  . losartan (COZAAR) 100 MG tablet TAKE 1 TABLET(100 MG) BY MOUTH DAILY 30 tablet 5  . diazepam (VALIUM) 10 MG tablet Through [facility]. 10mg  TID prn    . fluvoxaMINE (LUVOX) 100 MG tablet Through [facility]. 100mg  each evening    . mirtazapine (REMERON) 15 MG tablet Through [facility]. Take 15mg  in the evening.     No current facility-administered medications for this visit.    Objective: BP 128/88 mmHg  Pulse 79  Temp(Src) 98.3 F (36.8 C)  Wt 220 lb (99.791 kg) Gen: NAD, resting comfortably CV: RRR no murmurs rubs or gallops Lungs: CTAB no crackles, wheeze, rhonchi Abdomen: soft/nontender/nondistended/normal bowel sounds. overweight Ext: no edema Skin: warm, dry Neuro: grossly normal, moves all extremities  Assessment/Plan:  Essential hypertension, benign S: controlled at home and at [facility] at 120/80. Taking losartan 100mg .  BP Readings from Last 3 Encounters:  12/10/15 128/88  06/11/15 130/90  05/13/15 140/84  A/P:Continue current meds. Initial BP elevated but improved on repeat.   Panic attacks S: much improved control through [facility]- advised to go there given failed SSRI attempts. On fluvoxamine and remeron. Also diazepam 10mg  TID prn- weaning down on amount A/P: continue current medication, very pleased he is getting appropriate care through  psychiatry.    Return in about 6 months (around 06/10/2016) for physical. Return precautions advised.   Meds ordered this encounter  Medications  . fluvoxaMINE (LUVOX) 100 MG tablet    Sig: Through [facility]. 100mg  each evening  . mirtazapine (REMERON) 15 MG tablet    Sig: Through [facility]. Take 15mg  in the evening.  . diazepam (VALIUM) 10 MG tablet    Sig: Through [facility]. 10mg  TID prn   The duration of face-to-face time during this visit was 15 minutes. Greater than 50% of this time was spent in counseling, explanation of diagnosis, planning of further management, and/or coordination of care.

## 2015-12-10 NOTE — Assessment & Plan Note (Signed)
S: controlled at home and at [facility] at 120/80. Taking losartan 100mg .  BP Readings from Last 3 Encounters:  12/10/15 128/88  06/11/15 130/90  05/13/15 140/84  A/P:Continue current meds. Initial BP elevated but improved on repeat.

## 2016-06-06 ENCOUNTER — Other Ambulatory Visit: Payer: Self-pay

## 2016-06-13 ENCOUNTER — Encounter: Payer: Self-pay | Admitting: Family Medicine

## 2016-06-22 ENCOUNTER — Other Ambulatory Visit: Payer: Self-pay | Admitting: Family Medicine

## 2016-06-22 DIAGNOSIS — I10 Essential (primary) hypertension: Secondary | ICD-10-CM

## 2016-07-20 ENCOUNTER — Other Ambulatory Visit (INDEPENDENT_AMBULATORY_CARE_PROVIDER_SITE_OTHER): Payer: Commercial Managed Care - HMO

## 2016-07-20 DIAGNOSIS — Z0000 Encounter for general adult medical examination without abnormal findings: Secondary | ICD-10-CM | POA: Diagnosis not present

## 2016-07-20 LAB — CBC WITH DIFFERENTIAL/PLATELET
BASOS PCT: 0.3 % (ref 0.0–3.0)
Basophils Absolute: 0 10*3/uL (ref 0.0–0.1)
EOS ABS: 0.2 10*3/uL (ref 0.0–0.7)
Eosinophils Relative: 2.7 % (ref 0.0–5.0)
HEMATOCRIT: 45.2 % (ref 39.0–52.0)
HEMOGLOBIN: 15.6 g/dL (ref 13.0–17.0)
LYMPHS PCT: 45.7 % (ref 12.0–46.0)
Lymphs Abs: 3.7 10*3/uL (ref 0.7–4.0)
MCHC: 34.4 g/dL (ref 30.0–36.0)
MCV: 86.1 fl (ref 78.0–100.0)
MONO ABS: 0.5 10*3/uL (ref 0.1–1.0)
Monocytes Relative: 6.6 % (ref 3.0–12.0)
Neutro Abs: 3.6 10*3/uL (ref 1.4–7.7)
Neutrophils Relative %: 44.7 % (ref 43.0–77.0)
Platelets: 237 10*3/uL (ref 150.0–400.0)
RBC: 5.25 Mil/uL (ref 4.22–5.81)
RDW: 12.9 % (ref 11.5–15.5)
WBC: 8.1 10*3/uL (ref 4.0–10.5)

## 2016-07-20 LAB — BASIC METABOLIC PANEL
BUN: 15 mg/dL (ref 6–23)
CHLORIDE: 103 meq/L (ref 96–112)
CO2: 28 mEq/L (ref 19–32)
CREATININE: 1.2 mg/dL (ref 0.40–1.50)
Calcium: 9.2 mg/dL (ref 8.4–10.5)
GFR: 73.18 mL/min (ref >60.00)
Glucose, Bld: 91 mg/dL (ref 70–99)
POTASSIUM: 4.5 meq/L (ref 3.5–5.1)
Sodium: 141 mEq/L (ref 135–145)

## 2016-07-20 LAB — LIPID PANEL
CHOL/HDL RATIO: 3
Cholesterol: 205 mg/dL — ABNORMAL HIGH (ref 0–200)
HDL: 60.3 mg/dL (ref >39.00)
LDL CALC: 107 mg/dL — AB (ref 0–99)
NONHDL: 144.29
TRIGLYCERIDES: 184 mg/dL — AB (ref 0.0–149.0)
VLDL: 36.8 mg/dL (ref 0.0–40.0)

## 2016-07-20 LAB — POC URINALSYSI DIPSTICK (AUTOMATED)
Bilirubin, UA: NEGATIVE
Glucose, UA: NEGATIVE
KETONES UA: NEGATIVE
Leukocytes, UA: NEGATIVE
Nitrite, UA: NEGATIVE
RBC UA: NEGATIVE
SPEC GRAV UA: 1.02
UROBILINOGEN UA: 0.2
pH, UA: 5.5

## 2016-07-20 LAB — HEPATIC FUNCTION PANEL
ALK PHOS: 68 U/L (ref 39–117)
ALT: 27 U/L (ref 0–53)
AST: 21 U/L (ref 0–37)
Albumin: 4.4 g/dL (ref 3.5–5.2)
BILIRUBIN DIRECT: 0.1 mg/dL (ref 0.0–0.3)
BILIRUBIN TOTAL: 0.4 mg/dL (ref 0.2–1.2)
TOTAL PROTEIN: 6.9 g/dL (ref 6.0–8.3)

## 2016-07-20 LAB — TSH: TSH: 1.41 u[IU]/mL (ref 0.35–4.50)

## 2016-07-28 ENCOUNTER — Ambulatory Visit (INDEPENDENT_AMBULATORY_CARE_PROVIDER_SITE_OTHER): Payer: Commercial Managed Care - HMO | Admitting: Family Medicine

## 2016-07-28 ENCOUNTER — Encounter: Payer: Self-pay | Admitting: Family Medicine

## 2016-07-28 VITALS — BP 120/78 | HR 87 | Temp 98.5°F | Ht 69.25 in | Wt 238.0 lb

## 2016-07-28 DIAGNOSIS — Z0000 Encounter for general adult medical examination without abnormal findings: Secondary | ICD-10-CM

## 2016-07-28 MED ORDER — LOSARTAN POTASSIUM 100 MG PO TABS: ORAL_TABLET | ORAL | 3 refills | Status: DC
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
## 2016-07-28 NOTE — Patient Instructions (Signed)
Follow up in 6 months for weight and BP check. Really want you to target 200-210 (that gives you room for extra muscle on top of HS weight of 170).

## 2016-07-28 NOTE — Addendum Note (Signed)
Addended by: Marin Olp on: 07/28/2016 11:36 AM   Modules accepted: Orders

## 2016-07-28 NOTE — Progress Notes (Signed)
Pre visit review using our clinic review tool, if applicable. No additional management support is needed unless otherwise documented below in the visit note. 

## 2016-12-27 ENCOUNTER — Encounter: Payer: Self-pay | Admitting: Family Medicine

## 2016-12-27 ENCOUNTER — Ambulatory Visit (INDEPENDENT_AMBULATORY_CARE_PROVIDER_SITE_OTHER)
Admission: RE | Admit: 2016-12-27 | Discharge: 2016-12-27 | Disposition: A | Discharge status: Home / Self Care | Payer: Commercial Managed Care - HMO | Source: Ambulatory Visit | Attending: Family Medicine | Admitting: Family Medicine

## 2016-12-27 ENCOUNTER — Ambulatory Visit (INDEPENDENT_AMBULATORY_CARE_PROVIDER_SITE_OTHER): Payer: Commercial Managed Care - HMO | Admitting: Family Medicine

## 2016-12-27 VITALS — BP 128/82 | HR 78 | Temp 98.4°F | Ht 69.25 in | Wt 226.8 lb

## 2016-12-27 DIAGNOSIS — R0602 Shortness of breath: Secondary | ICD-10-CM

## 2016-12-27 DIAGNOSIS — R05 Cough: Secondary | ICD-10-CM

## 2016-12-27 DIAGNOSIS — R059 Cough, unspecified: Secondary | ICD-10-CM

## 2016-12-27 LAB — CBC WITH DIFFERENTIAL/PLATELET
BASOS ABS: 0 10*3/uL (ref 0.0–0.1)
Basophils Relative: 0.2 % (ref 0.0–3.0)
EOS ABS: 0.2 10*3/uL (ref 0.0–0.7)
Eosinophils Relative: 2.7 % (ref 0.0–5.0)
HCT: 45.5 % (ref 39.0–52.0)
Hemoglobin: 15.8 g/dL (ref 13.0–17.0)
LYMPHS ABS: 2.4 10*3/uL (ref 0.7–4.0)
Lymphocytes Relative: 33.4 % (ref 12.0–46.0)
MCHC: 34.7 g/dL (ref 30.0–36.0)
MCV: 84.4 fl (ref 78.0–100.0)
MONO ABS: 0.5 10*3/uL (ref 0.1–1.0)
Monocytes Relative: 7.3 % (ref 3.0–12.0)
NEUTROS ABS: 4 10*3/uL (ref 1.4–7.7)
NEUTROS PCT: 56.4 % (ref 43.0–77.0)
PLATELETS: 208 10*3/uL (ref 150.0–400.0)
RBC: 5.39 Mil/uL (ref 4.22–5.81)
RDW: 13.6 % (ref 11.5–15.5)
WBC: 7.1 10*3/uL (ref 4.0–10.5)

## 2016-12-27 LAB — BASIC METABOLIC PANEL
BUN: 20 mg/dL (ref 6–23)
CHLORIDE: 104 meq/L (ref 96–112)
CO2: 30 meq/L (ref 19–32)
CREATININE: 1.23 mg/dL (ref 0.40–1.50)
Calcium: 9.2 mg/dL (ref 8.4–10.5)
GFR: 70.94 mL/min (ref >60.00)
Glucose, Bld: 87 mg/dL (ref 70–99)
POTASSIUM: 4.9 meq/L (ref 3.5–5.1)
SODIUM: 139 meq/L (ref 135–145)

## 2016-12-27 MED ORDER — PREDNISONE 20 MG PO TABS: ORAL_TABLET | ORAL | 0 refills | Status: DC

## 2016-12-27 MED ORDER — BENZONATATE 100 MG PO CAPS: 100.0000 mg | ORAL_CAPSULE | Freq: Three times a day (TID) | ORAL | 0 refills | Status: DC | PRN

## 2016-12-27 MED ORDER — ALBUTEROL SULFATE HFA 108 (90 BASE) MCG/ACT IN AERS: 2.0000 | INHALATION_SPRAY | Freq: Four times a day (QID) | RESPIRATORY_TRACT | 0 refills | Status: DC | PRN

## 2016-12-27 NOTE — Progress Notes (Addendum)
PCP: Glenn Reddish, MD  Subjective:  Glenn [name] is a 36 y.o. year old very pleasant male patient who presents with  symptoms including nasal congestion (mild), cough (with bad coughing fits), chest congestion.  - does  have wheeze as well but has been a few days since this. Had fever last week up to 102 but broke on Thursday. Feels very fatigued. Denies significant shortness of breath but does feel mildly winded with activity- tried to go back to work today but felt too tired/winded to do it. Had a few flecks of blood in his mainly clear sputum as well.  -started: 2 weeks ago, symptoms are improving slowly -previous treatments:  mucinex makes him feel dizzy -sick contacts/travel/risks: denies flu exposure.   ROS-denies fever, NVD, tooth pain.   Pertinent Past Medical History-  Patient Active Problem List   Diagnosis Date Noted  . Tobacco abuse 02/24/2015    Priority: Medium  . Essential hypertension, benign 02/18/2014    Priority: Medium  . Panic attacks 12/10/2013    Priority: Medium    Medications- reviewed  Current Outpatient Prescriptions  Medication Sig Dispense Refill  . diazepam (VALIUM) 10 MG tablet [facility]. 10mg  TID prn    . fluvoxaMINE (LUVOX) 100 MG tablet Take 200 mg by mouth at bedtime. [facility]. 100mg  each evening    . losartan (COZAAR) 100 MG tablet TAKE 1 TABLET(100 MG) BY MOUTH DAILY 90 tablet 3  . traZODone (DESYREL) 100 MG tablet Take 1 tablet by mouth daily.     No current facility-administered medications for this visit.     Objective: BP 128/82 (BP Location: Left Arm, Patient Position: Sitting, Cuff Size: Large)   Pulse 78   Temp 98.4 F (36.9 C) (Oral)   Ht 5' 9.25" (1.759 m)   Wt 226 lb 12.8 oz (102.9 kg)   SpO2 95%   BMI 33.25 kg/m  Gen: appears very fatigued/run down HEENT: Turbinates erythematous with some clear drainage, TM normal, pharynx mildly erythematous with no tonsilar exudate or edema, no  sinus tenderness CV: RRR no murmurs rubs or gallops Lungs: CTAB no crackles, wheeze, rhonchi  Ext: no edema Skin: warm, dry, no rash  Assessment/Plan:  Bronchitis (most likely- need to rule out pneumonia History and exam today are suggestive of viral infection most likely due to bronchitis. Will get chest x-ray to rule out pneumonia. Appears very fatigued- update labs  Symptomatic treatment with: tessalon for cough, albuterol for wheezing or feeling winded Discussed prednisone: we will use this if his x-ray does not show pneumonia- obviously antibiotics if pneumonia  We discussed that we did not clearly find any infection that had higher probability of being bacterial such as pneumonia, strep throat, ear infection, bacterial sinusitis. We discussed signs that bacterial infection may have developed particularly fever or worsening shortness of breath and reasons for follow up (symptoms worsen, last past expected time frame, new concerns arise).  Likely course of 3-6 weeks. Patient is contagious and advised good handwashing and consideration of mask If going to be in public places. I will write him out of work for rest of the week- rest and hydration are best options right now  Meds ordered this encounter  . benzonatate (TESSALON) 100 MG capsule    Sig: Take 1 capsule (100 mg total) by mouth 3 (three) times daily as needed for cough.    Dispense:  30 capsule    Refill:  0  . albuterol (PROVENTIL HFA;VENTOLIN HFA) 108 (90  Base) MCG/ACT inhaler    Sig: Inhale 2 puffs into the lungs every 6 (six) hours as needed for wheezing or shortness of breath.    Dispense:  1 Inhaler    Refill:  0    Glenn Reddish, MD  addendum Dg Chest 2 View  Result Date: 12/27/2016 CLINICAL DATA:  Cough, wheezing, chest congestion, and fever. Duration of symptoms 2 weeks. Former smoker. EXAM: CHEST  2 VIEW COMPARISON:  Report of a chest x-ray of June 09, 2001 FINDINGS: The lungs are adequately inflated and  clear. The heart and pulmonary vascularity are normal. The mediastinum is normal in width. There is no pleural effusion. The trachea is midline. The bony thorax exhibits no acute abnormality. IMPRESSION: There is no pneumonia nor other acute cardiopulmonary abnormality. Electronically Signed   By: David  Martinique M.D.   On: 12/27/2016 15:24   No pneumonia- will send in prednisone for patient as suspect bronchitis  Glenn [name]

## 2016-12-27 NOTE — Addendum Note (Signed)
Addended by: Marin Olp on: 12/27/2016 04:37 PM   Modules accepted: Orders

## 2016-12-27 NOTE — Addendum Note (Signed)
Addended by: Marin Olp on: 12/27/2016 02:48 PM   Modules accepted: Orders

## 2016-12-27 NOTE — Patient Instructions (Signed)
Bronchitis (most likely- need to rule out pneumonia History and exam today are suggestive of viral infection most likely due to bronchitis. Will get chest x-ray to rule out pneumonia. Appears very fatigued- update labs  Symptomatic treatment with: tessalon for cough, albuterol for wheezing or feeling winded Discussed prednisone: we will use this if his x-ray does not show pneumonia- obviously antibiotics if pneumonia  We discussed that we did not clearly find any infection that had higher probability of being bacterial such as pneumonia, strep throat, ear infection, bacterial sinusitis. We discussed signs that bacterial infection may have developed particularly fever or worsening shortness of breath and reasons for follow up (symptoms worsen, last past expected time frame, new concerns arise).  Likely course of 3-6 weeks. Patient is contagious and advised good handwashing and consideration of mask If going to be in public places. I will write him out of work for rest of the week- rest and hydration are best options right now  Meds ordered this encounter  . benzonatate (TESSALON) 100 MG capsule    Sig: Take 1 capsule (100 mg total) by mouth 3 (three) times daily as needed for cough.    Dispense:  30 capsule    Refill:  0  . albuterol (PROVENTIL HFA;VENTOLIN HFA) 108 (90 Base) MCG/ACT inhaler    Sig: Inhale 2 puffs into the lungs every 6 (six) hours as needed for wheezing or shortness of breath.    Dispense:  1 Inhaler    Refill:  0

## 2016-12-27 NOTE — Progress Notes (Signed)
Pre visit review using our clinic review tool, if applicable. No additional management support is needed unless otherwise documented below in the visit note. 

## 2017-01-27 ENCOUNTER — Encounter: Payer: Self-pay | Admitting: Family Medicine

## 2017-01-27 ENCOUNTER — Ambulatory Visit (INDEPENDENT_AMBULATORY_CARE_PROVIDER_SITE_OTHER): Payer: Commercial Managed Care - HMO | Admitting: Family Medicine

## 2017-01-27 DIAGNOSIS — E6609 Other obesity due to excess calories: Secondary | ICD-10-CM | POA: Diagnosis not present

## 2017-01-27 DIAGNOSIS — I10 Essential (primary) hypertension: Secondary | ICD-10-CM

## 2017-01-27 DIAGNOSIS — Z720 Tobacco use: Secondary | ICD-10-CM

## 2017-01-27 NOTE — Patient Instructions (Addendum)
No changes today  Great job with weight loss  ______________________________________________________________________  Starting October 1st 2018, I will be transferring to our new location: [place] (corner of [address] and [address] from [employer]) [place] Phone: [phone]  I would love to have you remain my patient at this new location as long as it remains convenient for you. I am excited about the opportunity to have x-ray and sports medicine in the new building but will really miss the awesome staff and physicians at [facility]. Continue to schedule appointments at [facility] and we will automatically transfer them to the [place] location starting October 1st.

## 2017-01-27 NOTE — Progress Notes (Signed)
Subjective:  Glenn [name] is a 36 y.o. year old very pleasant male patient who presents for/with See problem oriented charting ROS- mild depression- improving. No SI. No chest pain or shortness of breath. No headache or blurry vision.    Past Medical History-  Patient Active Problem List   Diagnosis Date Noted  . Tobacco abuse 02/24/2015    Priority: Medium  . Essential hypertension, benign 02/18/2014    Priority: Medium  . Panic attacks 12/10/2013    Priority: Medium  . Obesity 01/28/2017    Medications- reviewed and updated Current Outpatient Prescriptions  Medication Sig Dispense Refill  . buPROPion (WELLBUTRIN XL) 150 MG 24 hr tablet Take 1 tablet by mouth daily.  2  . diazepam (VALIUM) 10 MG tablet Through [facility]. 10mg  TID prn    . fluvoxaMINE (LUVOX) 100 MG tablet Take 200 mg by mouth at bedtime. Through [facility]. 100mg  each evening    . losartan (COZAAR) 100 MG tablet TAKE 1 TABLET(100 MG) BY MOUTH DAILY 90 tablet 3  . traZODone (DESYREL) 100 MG tablet Take 1 tablet by mouth daily.     No current facility-administered medications for this visit.     Objective: BP 122/84   Pulse (!) 58   Temp 98.1 F (36.7 C) (Oral)   Ht 5' 9.25" (1.759 m)   Wt 220 lb (99.8 kg)   SpO2 94%   BMI 32.25 kg/m  Gen: NAD, resting comfortably CV: RRR no murmurs rubs or gallops Lungs: CTAB no crackles, wheeze, rhonchi Abdomen: obese Ext: no edema Skin: warm, dry Neuro: grossly normal, moves all extremities  Assessment/Plan:  Essential hypertension, benign S: controlled on losartan 100mg . Home readings 120s/80s or so. Has lost weight intentionally.  ASCVD 10 year risk calculation if age 23-79: too young BP Readings from Last 3 Encounters:  01/27/17 122/84  12/27/16 128/82  07/28/16 120/78  A/P: We discussed blood pressure goal of <140/90. Continue current meds:  Losartan 100mg . Continue weight loss efforts  Tobacco abuse Encouraged cessation. On  wellbutrin through psychiatry which is helping the desire die down some- discussed this is the perfect time to quit completley- he agrees to conside.r   Obesity S: trying to eat better and be more active Wt Readings from Last 3 Encounters:  01/27/17 220 lb (99.8 kg)  12/27/16 226 lb 12.8 oz (102.9 kg)  07/28/16 238 lb (108 kg)  A/P: encouraged continued effort- great progress so far! He is hoping to get closer to 210 (lower would be better but we will take one step at a time)    Return in about 6 months (around 07/29/2017) for physical. come fasting  Meds ordered this encounter  Medications  . buPROPion (WELLBUTRIN XL) 150 MG 24 hr tablet    Sig: Take 1 tablet by mouth daily.    Refill:  2    Return precautions advised.  [name], MD

## 2017-01-28 DIAGNOSIS — E669 Obesity, unspecified: Secondary | ICD-10-CM | POA: Insufficient documentation

## 2017-01-28 NOTE — Assessment & Plan Note (Signed)
Encouraged cessation. On wellbutrin through psychiatry which is helping the desire die down some- discussed this is the perfect time to quit completley- he agrees to conside.r

## 2017-01-28 NOTE — Assessment & Plan Note (Signed)
S: controlled on losartan 100mg . Home readings 120s/80s or so. Has lost weight intentionally.  ASCVD 10 year risk calculation if age 36-79: too young BP Readings from Last 3 Encounters:  01/27/17 122/84  12/27/16 128/82  07/28/16 120/78  A/P: We discussed blood pressure goal of <140/90. Continue current meds:  Losartan 100mg . Continue weight loss efforts

## 2017-01-28 NOTE — Assessment & Plan Note (Signed)
S: trying to eat better and be more active Wt Readings from Last 3 Encounters:  01/27/17 220 lb (99.8 kg)  12/27/16 226 lb 12.8 oz (102.9 kg)  07/28/16 238 lb (108 kg)  A/P: encouraged continued effort- great progress so far! He is hoping to get closer to 210 (lower would be better but we will take one step at a time)

## 2017-01-31 ENCOUNTER — Encounter: Payer: Self-pay | Admitting: Family Medicine

## 2017-01-31 DIAGNOSIS — J40 Bronchitis, not specified as acute or chronic: Secondary | ICD-10-CM

## 2017-01-31 MED ORDER — SILDENAFIL CITRATE 100 MG PO TABS: 100.0000 mg | ORAL_TABLET | Freq: Every day | ORAL | 11 refills | Status: DC | PRN

## 2017-02-03 ENCOUNTER — Telehealth: Payer: Self-pay | Admitting: Family Medicine

## 2017-02-03 NOTE — Telephone Encounter (Signed)
Pt dropped off FMLA paper work on 02/01/16.  Now filled out and placed on Dr. Ansel Bong desk for review and signature.

## 2017-02-06 NOTE — Telephone Encounter (Signed)
Left a message on identified voicemail informing the pt to pick up at the front desk.

## 2017-02-13 ENCOUNTER — Encounter: Payer: Self-pay | Admitting: Family Medicine

## 2017-02-14 ENCOUNTER — Encounter: Payer: Self-pay | Admitting: Family Medicine

## 2017-02-14 MED ORDER — TADALAFIL 20 MG PO TABS: 10.0000 mg | ORAL_TABLET | ORAL | 11 refills | Status: DC | PRN

## 2017-08-04 ENCOUNTER — Encounter: Payer: Commercial Managed Care - HMO | Admitting: Family Medicine

## 2017-08-19 ENCOUNTER — Other Ambulatory Visit: Payer: Self-pay | Admitting: Family Medicine

## 2017-09-04 ENCOUNTER — Ambulatory Visit (INDEPENDENT_AMBULATORY_CARE_PROVIDER_SITE_OTHER): Payer: Commercial Managed Care - HMO | Admitting: Family Medicine

## 2017-09-04 ENCOUNTER — Encounter: Payer: Self-pay | Admitting: Family Medicine

## 2017-09-04 ENCOUNTER — Other Ambulatory Visit (HOSPITAL_COMMUNITY)
Admission: RE | Admit: 2017-09-04 | Discharge: 2017-09-04 | Disposition: A | Discharge status: Home / Self Care | Payer: Self-pay | Source: Ambulatory Visit | Attending: Family Medicine | Admitting: Family Medicine

## 2017-09-04 VITALS — BP 114/86 | HR 87 | Temp 98.2°F | Ht 69.25 in | Wt 224.2 lb

## 2017-09-04 DIAGNOSIS — Z0000 Encounter for general adult medical examination without abnormal findings: Secondary | ICD-10-CM | POA: Insufficient documentation

## 2017-09-04 DIAGNOSIS — Z114 Encounter for screening for human immunodeficiency virus [HIV]: Secondary | ICD-10-CM

## 2017-09-04 DIAGNOSIS — E785 Hyperlipidemia, unspecified: Secondary | ICD-10-CM

## 2017-09-04 DIAGNOSIS — Z7251 High risk heterosexual behavior: Secondary | ICD-10-CM

## 2017-09-04 LAB — LIPID PANEL
CHOL/HDL RATIO: 4
Cholesterol: 249 mg/dL — ABNORMAL HIGH (ref 0–200)
HDL: 62.3 mg/dL (ref >39.00)
Triglycerides: 449 mg/dL — ABNORMAL HIGH (ref 0.0–149.0)

## 2017-09-04 LAB — COMPREHENSIVE METABOLIC PANEL
ALT: 69 U/L — AB (ref 0–53)
AST: 65 U/L — ABNORMAL HIGH (ref 0–37)
Albumin: 4.5 g/dL (ref 3.5–5.2)
Alkaline Phosphatase: 65 U/L (ref 39–117)
BILIRUBIN TOTAL: 0.6 mg/dL (ref 0.2–1.2)
BUN: 15 mg/dL (ref 6–23)
CO2: 25 meq/L (ref 19–32)
Calcium: 9.5 mg/dL (ref 8.4–10.5)
Chloride: 100 mEq/L (ref 96–112)
Creatinine, Ser: 0.99 mg/dL (ref 0.40–1.50)
GFR: 90.78 mL/min (ref >60.00)
GLUCOSE: 102 mg/dL — AB (ref 70–99)
Potassium: 4 mEq/L (ref 3.5–5.1)
SODIUM: 137 meq/L (ref 135–145)
Total Protein: 7.3 g/dL (ref 6.0–8.3)

## 2017-09-04 LAB — CBC
HCT: 46.9 % (ref 39.0–52.0)
HEMOGLOBIN: 15.9 g/dL (ref 13.0–17.0)
MCHC: 33.9 g/dL (ref 30.0–36.0)
MCV: 90.1 fl (ref 78.0–100.0)
Platelets: 274 10*3/uL (ref 150.0–400.0)
RBC: 5.21 Mil/uL (ref 4.22–5.81)
RDW: 13.3 % (ref 11.5–15.5)
WBC: 8.5 10*3/uL (ref 4.0–10.5)

## 2017-09-04 LAB — POC URINALSYSI DIPSTICK (AUTOMATED)
Bilirubin, UA: NEGATIVE
GLUCOSE UA: NEGATIVE
KETONES UA: NEGATIVE
Leukocytes, UA: NEGATIVE
Nitrite, UA: NEGATIVE
Protein, UA: NEGATIVE
RBC UA: NEGATIVE
Urobilinogen, UA: 0.2 E.U./dL
pH, UA: 5.5 (ref 5.0–8.0)

## 2017-09-04 LAB — LDL CHOLESTEROL, DIRECT: LDL DIRECT: 139 mg/dL

## 2017-09-04 NOTE — Patient Instructions (Addendum)
Please stop by lab before you go  [place] job getting down from 238 at last physical to 224. Keep up the good work with goal under 200 within a year or two.   Screen for STDs since havent been screened in the past  Let us know if that lef tside pain comes back so we can evaluate you  Great job cutting down on dipping- so close to cutting out completely- you can do it!

## 2017-09-04 NOTE — Addendum Note (Signed)
Addended by: Frutoso Chase A on: 09/04/2017 01:33 PM   Modules accepted: Orders
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
## 2017-09-05 ENCOUNTER — Other Ambulatory Visit: Payer: Self-pay

## 2017-09-05 DIAGNOSIS — R945 Abnormal results of liver function studies: Principal | ICD-10-CM

## 2017-09-05 DIAGNOSIS — R7989 Other specified abnormal findings of blood chemistry: Secondary | ICD-10-CM

## 2017-09-05 LAB — HIV ANTIBODY (ROUTINE TESTING W REFLEX): HIV 1&2 Ab, 4th Generation: NONREACTIVE

## 2017-09-05 LAB — RPR: RPR Ser Ql: NONREACTIVE

## 2017-09-05 LAB — URINE CYTOLOGY ANCILLARY ONLY
Chlamydia: NEGATIVE
NEISSERIA GONORRHEA: NEGATIVE
Trichomonas: NEGATIVE

## 2017-12-16 ENCOUNTER — Encounter (HOSPITAL_COMMUNITY): Payer: Self-pay

## 2017-12-16 ENCOUNTER — Emergency Department (HOSPITAL_COMMUNITY): Payer: 59

## 2017-12-16 ENCOUNTER — Other Ambulatory Visit: Payer: Self-pay

## 2017-12-16 ENCOUNTER — Emergency Department (HOSPITAL_COMMUNITY)
Admission: EM | Admit: 2017-12-16 | Discharge: 2017-12-16 | Disposition: A | Discharge status: Home / Self Care | Payer: 59 | Attending: Emergency Medicine | Admitting: Emergency Medicine

## 2017-12-16 DIAGNOSIS — I10 Essential (primary) hypertension: Secondary | ICD-10-CM | POA: Insufficient documentation

## 2017-12-16 DIAGNOSIS — R1013 Epigastric pain: Secondary | ICD-10-CM | POA: Diagnosis not present

## 2017-12-16 DIAGNOSIS — Z87891 Personal history of nicotine dependence: Secondary | ICD-10-CM | POA: Insufficient documentation

## 2017-12-16 DIAGNOSIS — R945 Abnormal results of liver function studies: Secondary | ICD-10-CM

## 2017-12-16 DIAGNOSIS — R7989 Other specified abnormal findings of blood chemistry: Secondary | ICD-10-CM | POA: Diagnosis not present

## 2017-12-16 DIAGNOSIS — K760 Fatty (change of) liver, not elsewhere classified: Secondary | ICD-10-CM | POA: Diagnosis not present

## 2017-12-16 DIAGNOSIS — R109 Unspecified abdominal pain: Secondary | ICD-10-CM | POA: Diagnosis present

## 2017-12-16 DIAGNOSIS — Z79899 Other long term (current) drug therapy: Secondary | ICD-10-CM | POA: Diagnosis not present

## 2017-12-16 DIAGNOSIS — R1031 Right lower quadrant pain: Secondary | ICD-10-CM | POA: Diagnosis not present

## 2017-12-16 LAB — LIPASE, BLOOD: LIPASE: 34 U/L (ref 11–51)

## 2017-12-16 LAB — CBC
HEMATOCRIT: 47.2 % (ref 39.0–52.0)
Hemoglobin: 16.6 g/dL (ref 13.0–17.0)
MCH: 30.9 pg (ref 26.0–34.0)
MCHC: 35.2 g/dL (ref 30.0–36.0)
MCV: 87.7 fL (ref 78.0–100.0)
Platelets: 247 10*3/uL (ref 150–400)
RBC: 5.38 MIL/uL (ref 4.22–5.81)
RDW: 12.8 % (ref 11.5–15.5)
WBC: 8.1 10*3/uL (ref 4.0–10.5)

## 2017-12-16 LAB — BASIC METABOLIC PANEL
ANION GAP: 15 (ref 5–15)
BUN: 18 mg/dL (ref 6–20)
CO2: 21 mmol/L — ABNORMAL LOW (ref 22–32)
Calcium: 8.9 mg/dL (ref 8.9–10.3)
Chloride: 99 mmol/L — ABNORMAL LOW (ref 101–111)
Creatinine, Ser: 1.19 mg/dL (ref 0.61–1.24)
GFR calc Af Amer: >60 mL/min (ref >60)
Glucose, Bld: 120 mg/dL — ABNORMAL HIGH (ref 65–99)
POTASSIUM: 4.6 mmol/L (ref 3.5–5.1)
Sodium: 135 mmol/L (ref 135–145)

## 2017-12-16 LAB — HEPATIC FUNCTION PANEL
ALT: 191 U/L — AB (ref 17–63)
AST: 400 U/L — ABNORMAL HIGH (ref 15–41)
Albumin: 4 g/dL (ref 3.5–5.0)
Alkaline Phosphatase: 74 U/L (ref 38–126)
BILIRUBIN INDIRECT: 0.6 mg/dL (ref 0.3–0.9)
Bilirubin, Direct: 0.3 mg/dL (ref 0.1–0.5)
TOTAL PROTEIN: 7.3 g/dL (ref 6.5–8.1)
Total Bilirubin: 0.9 mg/dL (ref 0.3–1.2)

## 2017-12-16 LAB — I-STAT TROPONIN, ED: Troponin i, poc: 0 ng/mL (ref 0.00–0.08)

## 2017-12-16 MED ORDER — ONDANSETRON 8 MG PO TBDP: 8.0000 mg | ORAL_TABLET | Freq: Three times a day (TID) | ORAL | 0 refills | Status: DC | PRN

## 2017-12-16 MED ORDER — IOPAMIDOL (ISOVUE-300) INJECTION 61%
INTRAVENOUS | Status: AC
  Filled 2017-12-16: qty 100

## 2017-12-16 MED ORDER — PANTOPRAZOLE SODIUM 20 MG PO TBEC: 20.0000 mg | DELAYED_RELEASE_TABLET | Freq: Every day | ORAL | 0 refills | Status: DC

## 2017-12-16 MED ORDER — SODIUM CHLORIDE 0.9 % IV BOLUS
1000.0000 mL | Freq: Once | INTRAVENOUS | Status: AC
  Administered 2017-12-16: 1000 mL via INTRAVENOUS

## 2017-12-16 MED ORDER — IOPAMIDOL (ISOVUE-300) INJECTION 61%
100.0000 mL | Freq: Once | INTRAVENOUS | Status: AC | PRN
  Administered 2017-12-16: 100 mL via INTRAVENOUS

## 2017-12-16 MED ORDER — GI COCKTAIL ~~LOC~~
30.0000 mL | Freq: Once | ORAL | Status: AC
  Administered 2017-12-16: 30 mL via ORAL
  Filled 2017-12-16: qty 30

## 2017-12-16 MED ORDER — ONDANSETRON HCL 4 MG/2ML IJ SOLN
4.0000 mg | Freq: Once | INTRAMUSCULAR | Status: AC
  Administered 2017-12-16: 4 mg via INTRAVENOUS
  Filled 2017-12-16: qty 2

## 2017-12-16 NOTE — ED Notes (Signed)
Pt stable, ambulatory, states understanding of discharge instructions 

## 2017-12-16 NOTE — ED Triage Notes (Signed)
Pt c/o pain in abdomen with radiation into chest and ribs since Monday with [facility].

## 2017-12-16 NOTE — ED Notes (Signed)
Patient transported to CT 
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
## 2017-12-16 NOTE — Discharge Instructions (Addendum)
Your liver function tests are elevated today.  Stop drinking alcohol.  Do not take any medications containing acetaminophen.  Drink plenty of fluids at home.  Take Protonix as prescribed.  Zofran for nausea and vomiting as needed.  Please follow-up closely with your primary care doctor for recheck of your liver function and further evaluation and testing.  Return to emergency department if worsening symptoms.

## 2018-02-11 ENCOUNTER — Inpatient Hospital Stay (HOSPITAL_COMMUNITY)
Admission: EM | Admit: 2018-02-11 | Discharge: 2018-02-17 | DRG: 438 | Disposition: A | Discharge status: Home / Self Care | Payer: 59 | Attending: Internal Medicine | Admitting: Internal Medicine

## 2018-02-11 ENCOUNTER — Emergency Department (HOSPITAL_COMMUNITY): Payer: 59

## 2018-02-11 ENCOUNTER — Encounter (HOSPITAL_COMMUNITY): Payer: Self-pay | Admitting: Radiology

## 2018-02-11 DIAGNOSIS — F10239 Alcohol dependence with withdrawal, unspecified: Secondary | ICD-10-CM | POA: Diagnosis present

## 2018-02-11 DIAGNOSIS — K760 Fatty (change of) liver, not elsewhere classified: Secondary | ICD-10-CM | POA: Diagnosis present

## 2018-02-11 DIAGNOSIS — R6520 Severe sepsis without septic shock: Secondary | ICD-10-CM | POA: Diagnosis not present

## 2018-02-11 DIAGNOSIS — A419 Sepsis, unspecified organism: Secondary | ICD-10-CM | POA: Diagnosis not present

## 2018-02-11 DIAGNOSIS — K8521 Alcohol induced acute pancreatitis with uninfected necrosis: Secondary | ICD-10-CM | POA: Diagnosis present

## 2018-02-11 DIAGNOSIS — F410 Panic disorder [episodic paroxysmal anxiety] without agoraphobia: Secondary | ICD-10-CM | POA: Diagnosis present

## 2018-02-11 DIAGNOSIS — E669 Obesity, unspecified: Secondary | ICD-10-CM | POA: Diagnosis present

## 2018-02-11 DIAGNOSIS — I10 Essential (primary) hypertension: Secondary | ICD-10-CM | POA: Diagnosis not present

## 2018-02-11 DIAGNOSIS — R52 Pain, unspecified: Secondary | ICD-10-CM | POA: Diagnosis not present

## 2018-02-11 DIAGNOSIS — R739 Hyperglycemia, unspecified: Secondary | ICD-10-CM | POA: Diagnosis present

## 2018-02-11 DIAGNOSIS — Z79899 Other long term (current) drug therapy: Secondary | ICD-10-CM

## 2018-02-11 DIAGNOSIS — N179 Acute kidney failure, unspecified: Secondary | ICD-10-CM

## 2018-02-11 DIAGNOSIS — F1020 Alcohol dependence, uncomplicated: Secondary | ICD-10-CM | POA: Diagnosis not present

## 2018-02-11 DIAGNOSIS — N170 Acute kidney failure with tubular necrosis: Secondary | ICD-10-CM | POA: Diagnosis not present

## 2018-02-11 DIAGNOSIS — M5489 Other dorsalgia: Secondary | ICD-10-CM | POA: Diagnosis not present

## 2018-02-11 DIAGNOSIS — R140 Abdominal distension (gaseous): Secondary | ICD-10-CM | POA: Diagnosis not present

## 2018-02-11 DIAGNOSIS — R6510 Systemic inflammatory response syndrome (SIRS) of non-infectious origin without acute organ dysfunction: Secondary | ICD-10-CM | POA: Diagnosis not present

## 2018-02-11 DIAGNOSIS — F1091 Alcohol use, unspecified, in remission: Secondary | ICD-10-CM | POA: Diagnosis present

## 2018-02-11 DIAGNOSIS — K852 Alcohol induced acute pancreatitis without necrosis or infection: Secondary | ICD-10-CM | POA: Diagnosis present

## 2018-02-11 DIAGNOSIS — E861 Hypovolemia: Secondary | ICD-10-CM | POA: Diagnosis present

## 2018-02-11 DIAGNOSIS — Z87891 Personal history of nicotine dependence: Secondary | ICD-10-CM | POA: Diagnosis not present

## 2018-02-11 DIAGNOSIS — K8591 Acute pancreatitis with uninfected necrosis, unspecified: Secondary | ICD-10-CM | POA: Diagnosis not present

## 2018-02-11 DIAGNOSIS — R740 Nonspecific elevation of levels of transaminase and lactic acid dehydrogenase [LDH]: Secondary | ICD-10-CM | POA: Diagnosis present

## 2018-02-11 DIAGNOSIS — E876 Hypokalemia: Secondary | ICD-10-CM | POA: Diagnosis present

## 2018-02-11 DIAGNOSIS — E8770 Fluid overload, unspecified: Secondary | ICD-10-CM | POA: Diagnosis not present

## 2018-02-11 DIAGNOSIS — K859 Acute pancreatitis without necrosis or infection, unspecified: Secondary | ICD-10-CM

## 2018-02-11 DIAGNOSIS — F1021 Alcohol dependence, in remission: Secondary | ICD-10-CM | POA: Diagnosis present

## 2018-02-11 DIAGNOSIS — F10939 Alcohol use, unspecified with withdrawal, unspecified: Secondary | ICD-10-CM | POA: Diagnosis present

## 2018-02-11 DIAGNOSIS — Z6833 Body mass index (BMI) 33.0-33.9, adult: Secondary | ICD-10-CM

## 2018-02-11 DIAGNOSIS — Z885 Allergy status to narcotic agent status: Secondary | ICD-10-CM

## 2018-02-11 DIAGNOSIS — F329 Major depressive disorder, single episode, unspecified: Secondary | ICD-10-CM | POA: Diagnosis present

## 2018-02-11 DIAGNOSIS — K8592 Acute pancreatitis with infected necrosis, unspecified: Secondary | ICD-10-CM | POA: Diagnosis not present

## 2018-02-11 DIAGNOSIS — Z720 Tobacco use: Secondary | ICD-10-CM | POA: Diagnosis present

## 2018-02-11 DIAGNOSIS — R079 Chest pain, unspecified: Secondary | ICD-10-CM | POA: Diagnosis not present

## 2018-02-11 DIAGNOSIS — R109 Unspecified abdominal pain: Secondary | ICD-10-CM | POA: Diagnosis not present

## 2018-02-11 DIAGNOSIS — K8522 Alcohol induced acute pancreatitis with infected necrosis: Secondary | ICD-10-CM | POA: Diagnosis not present

## 2018-02-11 DIAGNOSIS — R402413 Glasgow coma scale score 13-15, at hospital admission: Secondary | ICD-10-CM | POA: Diagnosis present

## 2018-02-11 HISTORY — DX: Gastro-esophageal reflux disease without esophagitis: K21.9

## 2018-02-11 HISTORY — DX: Major depressive disorder, single episode, unspecified: F32.9

## 2018-02-11 HISTORY — DX: Acute kidney failure, unspecified: N17.9

## 2018-02-11 HISTORY — DX: Acute pancreatitis without necrosis or infection, unspecified: K85.90

## 2018-02-11 HISTORY — DX: Anemia, unspecified: D64.9

## 2018-02-11 HISTORY — DX: Depression, unspecified: F32.A

## 2018-02-11 LAB — URINALYSIS, ROUTINE W REFLEX MICROSCOPIC
BILIRUBIN URINE: NEGATIVE
GLUCOSE, UA: NEGATIVE mg/dL
HGB URINE DIPSTICK: NEGATIVE
KETONES UR: NEGATIVE mg/dL
Leukocytes, UA: NEGATIVE
Nitrite: NEGATIVE
PH: 5 (ref 5.0–8.0)
PROTEIN: NEGATIVE mg/dL
Specific Gravity, Urine: 1.024 (ref 1.005–1.030)

## 2018-02-11 LAB — COMPREHENSIVE METABOLIC PANEL
ALBUMIN: 3.7 g/dL (ref 3.5–5.0)
ALK PHOS: 70 U/L (ref 38–126)
ALT: 88 U/L — AB (ref 17–63)
AST: 131 U/L — AB (ref 15–41)
Anion gap: 11 (ref 5–15)
BILIRUBIN TOTAL: 1 mg/dL (ref 0.3–1.2)
BUN: 16 mg/dL (ref 6–20)
CALCIUM: 8.9 mg/dL (ref 8.9–10.3)
CO2: 23 mmol/L (ref 22–32)
CREATININE: 1.06 mg/dL (ref 0.61–1.24)
Chloride: 102 mmol/L (ref 101–111)
GFR calc Af Amer: >60 mL/min (ref >60)
GLUCOSE: 119 mg/dL — AB (ref 65–99)
Potassium: 4.4 mmol/L (ref 3.5–5.1)
Sodium: 136 mmol/L (ref 135–145)
TOTAL PROTEIN: 6.9 g/dL (ref 6.5–8.1)

## 2018-02-11 LAB — CBC WITH DIFFERENTIAL/PLATELET
Abs Immature Granulocytes: 0.1 10*3/uL (ref 0.0–0.1)
Basophils Absolute: 0 10*3/uL (ref 0.0–0.1)
Basophils Relative: 0 %
EOS ABS: 0.1 10*3/uL (ref 0.0–0.7)
Eosinophils Relative: 1 %
HEMATOCRIT: 49.3 % (ref 39.0–52.0)
HEMOGLOBIN: 16.8 g/dL (ref 13.0–17.0)
IMMATURE GRANULOCYTES: 0 %
LYMPHS ABS: 2.3 10*3/uL (ref 0.7–4.0)
LYMPHS PCT: 15 %
MCH: 30.3 pg (ref 26.0–34.0)
MCHC: 34.1 g/dL (ref 30.0–36.0)
MCV: 88.8 fL (ref 78.0–100.0)
MONOS PCT: 7 %
Monocytes Absolute: 1 10*3/uL (ref 0.1–1.0)
NEUTROS PCT: 77 %
Neutro Abs: 12.2 10*3/uL — ABNORMAL HIGH (ref 1.7–7.7)
Platelets: 234 10*3/uL (ref 150–400)
RBC: 5.55 MIL/uL (ref 4.22–5.81)
RDW: 12.2 % (ref 11.5–15.5)
WBC: 15.6 10*3/uL — ABNORMAL HIGH (ref 4.0–10.5)

## 2018-02-11 LAB — I-STAT TROPONIN, ED: Troponin i, poc: 0 ng/mL (ref 0.00–0.08)

## 2018-02-11 LAB — GLUCOSE, CAPILLARY
GLUCOSE-CAPILLARY: 174 mg/dL — AB (ref 65–99)
GLUCOSE-CAPILLARY: 166 mg/dL — AB (ref 65–99)

## 2018-02-11 LAB — LIPASE, BLOOD: LIPASE: 680 U/L — AB (ref 11–51)

## 2018-02-11 LAB — CBG MONITORING, ED: GLUCOSE-CAPILLARY: 158 mg/dL — AB (ref 65–99)

## 2018-02-11 MED ORDER — ENOXAPARIN SODIUM 40 MG/0.4ML ~~LOC~~ SOLN
40.0000 mg | SUBCUTANEOUS | Status: DC
  Administered 2018-02-11 – 2018-02-16 (×6): 40 mg via SUBCUTANEOUS
  Filled 2018-02-11 (×7): qty 0.4

## 2018-02-11 MED ORDER — HYDROMORPHONE HCL 2 MG/ML IJ SOLN
1.0000 mg | INTRAMUSCULAR | Status: DC | PRN
  Administered 2018-02-11 – 2018-02-12 (×10): 1 mg via INTRAVENOUS
  Filled 2018-02-11 (×9): qty 1

## 2018-02-11 MED ORDER — IOHEXOL 300 MG/ML  SOLN
100.0000 mL | Freq: Once | INTRAMUSCULAR | Status: AC | PRN
  Administered 2018-02-11: 100 mL via INTRAVENOUS

## 2018-02-11 MED ORDER — DIAZEPAM 5 MG PO TABS
10.0000 mg | ORAL_TABLET | Freq: Two times a day (BID) | ORAL | Status: DC
  Administered 2018-02-11 – 2018-02-14 (×7): 10 mg via ORAL
  Filled 2018-02-11 (×7): qty 2

## 2018-02-11 MED ORDER — ONDANSETRON HCL 4 MG/2ML IJ SOLN
4.0000 mg | Freq: Once | INTRAMUSCULAR | Status: AC
  Administered 2018-02-11: 4 mg via INTRAVENOUS
  Filled 2018-02-11: qty 2

## 2018-02-11 MED ORDER — HYDROMORPHONE HCL 2 MG/ML IJ SOLN
1.0000 mg | Freq: Once | INTRAMUSCULAR | Status: AC
  Administered 2018-02-11: 1 mg via INTRAVENOUS
  Filled 2018-02-11: qty 1

## 2018-02-11 MED ORDER — ADULT MULTIVITAMIN W/MINERALS CH
1.0000 | ORAL_TABLET | Freq: Every day | ORAL | Status: DC
  Administered 2018-02-12 – 2018-02-17 (×6): 1 via ORAL
  Filled 2018-02-11 (×6): qty 1

## 2018-02-11 MED ORDER — FLUVOXAMINE MALEATE 100 MG PO TABS
200.0000 mg | ORAL_TABLET | Freq: Every day | ORAL | Status: DC
  Administered 2018-02-11 – 2018-02-16 (×6): 200 mg via ORAL
  Filled 2018-02-11 (×8): qty 2

## 2018-02-11 MED ORDER — LORAZEPAM 2 MG/ML IJ SOLN
0.0000 mg | Freq: Four times a day (QID) | INTRAMUSCULAR | Status: AC
  Administered 2018-02-11: 2 mg via INTRAVENOUS
  Filled 2018-02-11: qty 1

## 2018-02-11 MED ORDER — PIPERACILLIN-TAZOBACTAM 3.375 G IVPB 30 MIN
3.3750 g | Freq: Four times a day (QID) | INTRAVENOUS | Status: DC
  Administered 2018-02-11 (×2): 3.375 g via INTRAVENOUS
  Filled 2018-02-11 (×2): qty 50

## 2018-02-11 MED ORDER — DIAZEPAM 5 MG PO TABS: 10.0000 mg | ORAL_TABLET | Freq: Three times a day (TID) | ORAL | Status: DC | PRN

## 2018-02-11 MED ORDER — SODIUM CHLORIDE 0.9% FLUSH: 3.0000 mL | INTRAVENOUS | Status: DC | PRN

## 2018-02-11 MED ORDER — SODIUM CHLORIDE 0.9% FLUSH
3.0000 mL | Freq: Two times a day (BID) | INTRAVENOUS | Status: DC
  Administered 2018-02-14 – 2018-02-15 (×2): 3 mL via INTRAVENOUS

## 2018-02-11 MED ORDER — TRAZODONE HCL 100 MG PO TABS
100.0000 mg | ORAL_TABLET | Freq: Every day | ORAL | Status: DC
  Administered 2018-02-11 – 2018-02-16 (×6): 100 mg via ORAL
  Filled 2018-02-11 (×6): qty 1

## 2018-02-11 MED ORDER — PIPERACILLIN-TAZOBACTAM 3.375 G IVPB 30 MIN: 3.3750 g | Freq: Four times a day (QID) | INTRAVENOUS | Status: DC

## 2018-02-11 MED ORDER — INSULIN ASPART 100 UNIT/ML ~~LOC~~ SOLN
0.0000 [IU] | Freq: Three times a day (TID) | SUBCUTANEOUS | Status: DC
  Administered 2018-02-12 – 2018-02-17 (×7): 2 [IU] via SUBCUTANEOUS

## 2018-02-11 MED ORDER — SODIUM CHLORIDE 0.9 % IV SOLN
Freq: Once | INTRAVENOUS | Status: AC
  Administered 2018-02-11: 08:00:00 via INTRAVENOUS

## 2018-02-11 MED ORDER — ONDANSETRON HCL 4 MG PO TABS: 4.0000 mg | ORAL_TABLET | Freq: Four times a day (QID) | ORAL | Status: DC | PRN

## 2018-02-11 MED ORDER — LOSARTAN POTASSIUM 50 MG PO TABS
100.0000 mg | ORAL_TABLET | Freq: Every day | ORAL | Status: DC
Start: 2018-02-11 — End: 2018-02-12
  Administered 2018-02-11 – 2018-02-12 (×2): 100 mg via ORAL
  Filled 2018-02-11 (×2): qty 2

## 2018-02-11 MED ORDER — LORAZEPAM 2 MG/ML IJ SOLN: 1.0000 mg | Freq: Four times a day (QID) | INTRAMUSCULAR | Status: AC | PRN

## 2018-02-11 MED ORDER — PIPERACILLIN-TAZOBACTAM 3.375 G IVPB
3.3750 g | Freq: Three times a day (TID) | INTRAVENOUS | Status: DC
  Administered 2018-02-11 – 2018-02-17 (×17): 3.375 g via INTRAVENOUS
  Filled 2018-02-11 (×19): qty 50

## 2018-02-11 MED ORDER — THIAMINE HCL 100 MG/ML IJ SOLN
Freq: Every day | INTRAVENOUS | Status: DC
  Administered 2018-02-11 – 2018-02-15 (×5): via INTRAVENOUS
  Filled 2018-02-11 (×10): qty 1000

## 2018-02-11 MED ORDER — LORAZEPAM 2 MG/ML IJ SOLN: 0.0000 mg | Freq: Two times a day (BID) | INTRAMUSCULAR | Status: AC

## 2018-02-11 MED ORDER — VITAMIN B-1 100 MG PO TABS
100.0000 mg | ORAL_TABLET | Freq: Every day | ORAL | Status: DC
  Administered 2018-02-12 – 2018-02-17 (×6): 100 mg via ORAL
  Filled 2018-02-11 (×6): qty 1

## 2018-02-11 MED ORDER — SODIUM CHLORIDE 0.9 % IV SOLN
INTRAVENOUS | Status: DC
  Administered 2018-02-11 – 2018-02-16 (×10): via INTRAVENOUS

## 2018-02-11 MED ORDER — SODIUM CHLORIDE 0.9 % IV BOLUS
500.0000 mL | Freq: Once | INTRAVENOUS | Status: AC
  Administered 2018-02-11: 500 mL via INTRAVENOUS

## 2018-02-11 MED ORDER — THIAMINE HCL 100 MG/ML IJ SOLN: 100.0000 mg | Freq: Every day | INTRAMUSCULAR | Status: DC

## 2018-02-11 MED ORDER — ONDANSETRON HCL 4 MG/2ML IJ SOLN
4.0000 mg | Freq: Four times a day (QID) | INTRAMUSCULAR | Status: DC | PRN
Start: 2018-02-11 — End: 2018-02-17

## 2018-02-11 MED ORDER — SODIUM CHLORIDE 0.9 % IV BOLUS
1000.0000 mL | Freq: Once | INTRAVENOUS | Status: AC
  Administered 2018-02-11: 1000 mL via INTRAVENOUS

## 2018-02-11 MED ORDER — FOLIC ACID 1 MG PO TABS
1.0000 mg | ORAL_TABLET | Freq: Every day | ORAL | Status: DC
  Administered 2018-02-12 – 2018-02-17 (×6): 1 mg via ORAL
  Filled 2018-02-11 (×6): qty 1

## 2018-02-11 MED ORDER — HYDROMORPHONE HCL 2 MG/ML IJ SOLN
1.0000 mg | Freq: Once | INTRAMUSCULAR | Status: DC
  Filled 2018-02-11: qty 1

## 2018-02-11 MED ORDER — LORAZEPAM 1 MG PO TABS: 1.0000 mg | ORAL_TABLET | Freq: Four times a day (QID) | ORAL | Status: AC | PRN

## 2018-02-11 MED ORDER — FENTANYL CITRATE (PF) 100 MCG/2ML IJ SOLN
100.0000 ug | Freq: Once | INTRAMUSCULAR | Status: AC
  Administered 2018-02-11: 100 ug via INTRAVENOUS
  Filled 2018-02-11: qty 2

## 2018-02-11 MED ORDER — SODIUM CHLORIDE 0.9 % IV SOLN: 250.0000 mL | INTRAVENOUS | Status: DC | PRN

## 2018-02-11 MED ORDER — BISACODYL 10 MG RE SUPP: 10.0000 mg | Freq: Every day | RECTAL | Status: DC | PRN

## 2018-02-11 NOTE — ED Triage Notes (Signed)
Pt BIB [facility] for left sided chest pain that started approx 0530 yesterday. Worse with movement. Pt took motrin around 0830 yesterday and it seemed to help. Pain is now 10/10. Endorses [facility]. Denies Dizziness or N/V
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
## 2018-02-11 NOTE — ED Notes (Signed)
Pt's saturations noted to drop after dilaudid administration.  Will place on 2L [place]

## 2018-02-12 ENCOUNTER — Encounter (HOSPITAL_COMMUNITY): Payer: Self-pay | Admitting: General Surgery

## 2018-02-12 ENCOUNTER — Inpatient Hospital Stay (HOSPITAL_COMMUNITY): Payer: 59

## 2018-02-12 ENCOUNTER — Other Ambulatory Visit: Payer: Self-pay

## 2018-02-12 DIAGNOSIS — I10 Essential (primary) hypertension: Secondary | ICD-10-CM

## 2018-02-12 DIAGNOSIS — K760 Fatty (change of) liver, not elsewhere classified: Secondary | ICD-10-CM

## 2018-02-12 DIAGNOSIS — R739 Hyperglycemia, unspecified: Secondary | ICD-10-CM

## 2018-02-12 DIAGNOSIS — F10239 Alcohol dependence with withdrawal, unspecified: Secondary | ICD-10-CM

## 2018-02-12 DIAGNOSIS — F1020 Alcohol dependence, uncomplicated: Secondary | ICD-10-CM

## 2018-02-12 LAB — CBC
HCT: 41.4 % (ref 39.0–52.0)
HEMOGLOBIN: 13.7 g/dL (ref 13.0–17.0)
MCH: 30.5 pg (ref 26.0–34.0)
MCHC: 33.1 g/dL (ref 30.0–36.0)
MCV: 92.2 fL (ref 78.0–100.0)
Platelets: 186 10*3/uL (ref 150–400)
RBC: 4.49 MIL/uL (ref 4.22–5.81)
RDW: 12.8 % (ref 11.5–15.5)
WBC: 16 10*3/uL — AB (ref 4.0–10.5)

## 2018-02-12 LAB — COMPREHENSIVE METABOLIC PANEL
ALBUMIN: 2.8 g/dL — AB (ref 3.5–5.0)
ALT: 46 U/L (ref 17–63)
ANION GAP: 7 (ref 5–15)
AST: 46 U/L — ABNORMAL HIGH (ref 15–41)
Alkaline Phosphatase: 49 U/L (ref 38–126)
BILIRUBIN TOTAL: 1.3 mg/dL — AB (ref 0.3–1.2)
BUN: 22 mg/dL — ABNORMAL HIGH (ref 6–20)
CO2: 27 mmol/L (ref 22–32)
Calcium: 8 mg/dL — ABNORMAL LOW (ref 8.9–10.3)
Chloride: 105 mmol/L (ref 101–111)
Creatinine, Ser: 3.38 mg/dL — ABNORMAL HIGH (ref 0.61–1.24)
GFR calc Af Amer: 25 mL/min — ABNORMAL LOW (ref >60)
GFR, EST NON AFRICAN AMERICAN: 22 mL/min — AB (ref >60)
Glucose, Bld: 151 mg/dL — ABNORMAL HIGH (ref 65–99)
POTASSIUM: 5 mmol/L (ref 3.5–5.1)
Sodium: 139 mmol/L (ref 135–145)
TOTAL PROTEIN: 5.7 g/dL — AB (ref 6.5–8.1)

## 2018-02-12 LAB — GLUCOSE, CAPILLARY
GLUCOSE-CAPILLARY: 148 mg/dL — AB (ref 65–99)
Glucose-Capillary: 141 mg/dL — ABNORMAL HIGH (ref 65–99)
Glucose-Capillary: 139 mg/dL — ABNORMAL HIGH (ref 65–99)
Glucose-Capillary: 130 mg/dL — ABNORMAL HIGH (ref 65–99)

## 2018-02-12 LAB — BASIC METABOLIC PANEL
Anion gap: 6 (ref 5–15)
BUN: 24 mg/dL — ABNORMAL HIGH (ref 6–20)
CHLORIDE: 106 mmol/L (ref 101–111)
CO2: 25 mmol/L (ref 22–32)
CREATININE: 3.57 mg/dL — AB (ref 0.61–1.24)
Calcium: 8.1 mg/dL — ABNORMAL LOW (ref 8.9–10.3)
GFR calc Af Amer: 24 mL/min — ABNORMAL LOW (ref >60)
GFR calc non Af Amer: 20 mL/min — ABNORMAL LOW (ref >60)
Glucose, Bld: 146 mg/dL — ABNORMAL HIGH (ref 65–99)
POTASSIUM: 4.6 mmol/L (ref 3.5–5.1)
SODIUM: 137 mmol/L (ref 135–145)

## 2018-02-12 LAB — LACTIC ACID, PLASMA
Lactic Acid, Venous: 1.1 mmol/L (ref 0.5–1.9)
Lactic Acid, Venous: 0.9 mmol/L (ref 0.5–1.9)

## 2018-02-12 LAB — PROCALCITONIN: PROCALCITONIN: 1.53 ng/mL

## 2018-02-12 MED ORDER — HYDROMORPHONE HCL 2 MG/ML IJ SOLN
0.5000 mg | INTRAMUSCULAR | Status: DC | PRN
  Administered 2018-02-13 – 2018-02-15 (×11): 0.5 mg via INTRAVENOUS
  Filled 2018-02-12 (×11): qty 1

## 2018-02-12 MED ORDER — SODIUM CHLORIDE 0.9 % IV BOLUS
500.0000 mL | Freq: Once | INTRAVENOUS | Status: AC
  Administered 2018-02-12: 500 mL via INTRAVENOUS

## 2018-02-12 MED ORDER — OXYCODONE HCL ER 10 MG PO T12A
10.0000 mg | EXTENDED_RELEASE_TABLET | Freq: Two times a day (BID) | ORAL | Status: DC
  Administered 2018-02-12 – 2018-02-17 (×11): 10 mg via ORAL
  Filled 2018-02-12 (×11): qty 1

## 2018-02-12 MED ORDER — SENNOSIDES-DOCUSATE SODIUM 8.6-50 MG PO TABS
1.0000 | ORAL_TABLET | Freq: Two times a day (BID) | ORAL | Status: DC
  Administered 2018-02-12 – 2018-02-17 (×11): 1 via ORAL
  Filled 2018-02-12 (×11): qty 1

## 2018-02-12 MED ORDER — HYDROMORPHONE HCL 2 MG/ML IJ SOLN: 1.0000 mg | INTRAMUSCULAR | Status: DC | PRN
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
## 2018-02-12 NOTE — Progress Notes (Signed)
Dr. Nevada Crane notified of BP- Will Bolus 532ml at this time

## 2018-02-13 DIAGNOSIS — R140 Abdominal distension (gaseous): Secondary | ICD-10-CM

## 2018-02-13 LAB — COMPREHENSIVE METABOLIC PANEL
ALBUMIN: 2.4 g/dL — AB (ref 3.5–5.0)
ALT: 33 U/L (ref 17–63)
ANION GAP: 7 (ref 5–15)
AST: 36 U/L (ref 15–41)
Alkaline Phosphatase: 51 U/L (ref 38–126)
BUN: 14 mg/dL (ref 6–20)
CHLORIDE: 109 mmol/L (ref 101–111)
CO2: 24 mmol/L (ref 22–32)
Calcium: 8 mg/dL — ABNORMAL LOW (ref 8.9–10.3)
Creatinine, Ser: 1.5 mg/dL — ABNORMAL HIGH (ref 0.61–1.24)
GFR calc non Af Amer: 58 mL/min — ABNORMAL LOW (ref >60)
GLUCOSE: 130 mg/dL — AB (ref 65–99)
Potassium: 4.1 mmol/L (ref 3.5–5.1)
SODIUM: 140 mmol/L (ref 135–145)
Total Bilirubin: 1.1 mg/dL (ref 0.3–1.2)
Total Protein: 5.5 g/dL — ABNORMAL LOW (ref 6.5–8.1)

## 2018-02-13 LAB — GLUCOSE, CAPILLARY
GLUCOSE-CAPILLARY: 122 mg/dL — AB (ref 65–99)
GLUCOSE-CAPILLARY: 115 mg/dL — AB (ref 65–99)
GLUCOSE-CAPILLARY: 107 mg/dL — AB (ref 65–99)
Glucose-Capillary: 117 mg/dL — ABNORMAL HIGH (ref 65–99)

## 2018-02-13 LAB — CBC
HCT: 37.3 % — ABNORMAL LOW (ref 39.0–52.0)
Hemoglobin: 11.9 g/dL — ABNORMAL LOW (ref 13.0–17.0)
MCH: 30.3 pg (ref 26.0–34.0)
MCHC: 31.9 g/dL (ref 30.0–36.0)
MCV: 94.9 fL (ref 78.0–100.0)
PLATELETS: 150 10*3/uL (ref 150–400)
RBC: 3.93 MIL/uL — AB (ref 4.22–5.81)
RDW: 13.1 % (ref 11.5–15.5)
WBC: 10.9 10*3/uL — ABNORMAL HIGH (ref 4.0–10.5)

## 2018-02-13 LAB — LIPASE, BLOOD: Lipase: 46 U/L (ref 11–51)

## 2018-02-13 NOTE — Plan of Care (Signed)
  Problem: Nutrition: Goal: Adequate nutrition will be maintained Outcome: Progressing   Problem: Coping: Goal: Level of anxiety will decrease Outcome: Progressing   Problem: Pain Managment: Goal: General experience of comfort will improve Outcome: Progressing   

## 2018-02-13 NOTE — Clinical Social Work Note (Signed)
Clinical Social Work Assessment  Patient Details  Name: Glenn [name] MRN: [id number] Date of Birth: [birth year]  Date of referral:  02/13/18               Reason for consult:  Substance Use/ETOH Abuse                Permission sought to share information with:  Family Supports Permission granted to share information::  No   Housing/Transportation Living arrangements for the past 2 months:  Single Family Home Source of Information:  Patient Patient Interpreter Needed:  None Criminal Activity/Legal Involvement Pertinent to Current Situation/Hospitalization:  No - Comment as needed Significant Relationships:  Pets, Friend, Parents, Siblings Lives with:  Siblings, Pets Do you feel safe going back to the place where you live?  Yes Need for family participation in patient care:  No (Coment)  Care giving concerns:  Pt consumes per his report around 8 beers daily and a gallon of liquor a week. Pt current hospitalization exacerbated by substance habits.    Social Worker assessment / plan:  CSW met with pt at bedside. 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   SBIRT completed, when asked about drinking pt admits to ETOH consumption but states "I'm done." CSW offered resource packet in case pt needed additional support or needed crisis line number. Pt accepted offer and was given packet. 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   Employment status:  Therapist, music:  Managed Care PT Recommendations:  Not assessed at this time Information / Referral to community resources:  Residential Substance  Abuse Treatment Options, Outpatient Substance Abuse Treatment Options, SBIRT  Patient/Family's Response to care:  Pt accepting of CSW visit, aware of why CSW was consulted, accepted resources with crisis line.   Patient/Family's Understanding of and Emotional Response to Diagnosis, Current Treatment, and Prognosis: Pt states understanding of diagnosis, current treatment and prognosis. Pt states awareness that his ETOH habits may contribute to current hospitalization. Pt states intentions of quitting ETOH consumption. CSW reinforced that pt is adult and may may his own decisions but encouraged to use resources if pt feels he would benefit from other support.   Emotional Assessment Appearance:  Appears stated age Attitude/Demeanor/Rapport:  Engaged, Gracious Affect (typically observed):  Accepting, Appropriate, Pleasant Orientation:  Oriented to Self, Oriented to Place, Oriented to  Time, Oriented to Situation Alcohol / Substance use:  Alcohol Use(8 beers a day (12 ounces) and consumes half a gallon of liquor a week) Psych involvement (Current and /or in the community):  Outpatient Provider  Discharge Needs  Concerns to be addressed:  Care Coordination, Substance Abuse Concerns Readmission within the last 30 days:  No Current discharge risk:  Substance Abuse Barriers to Discharge:  Continued Medical Work up, Active Substance Use   Alexander Mt, [place] 02/13/2018, 4:14 PM

## 2018-02-13 NOTE — Progress Notes (Signed)
[facility] Progress Note    Assessment/ Plan:   1.  AKI: appears to be ATN related to hypovolemia/ acute pancreatitis.  Getting better with aggressive supportive care and fluid resuscitation.  On Cozaar as OP- would not restart until back to baseline Cr and BP s normalized/ up.  He is improving and is not in need of dialysis.  Expect cr to go back to baseline.  I'll sign off now- he does not need any followup with our office at this time.  2.  Necrotizing pancreatitis: supportive care, boluses as above, the rest per primary and gen surg.  On Zosyn.    3.  EtOH withdrawal: needs rehab likely, CIWA protocol.    Subjective:    Creatinine much improved after fluid resuscitation.  Still has abd pain.  Trying to eat ice chips.     Objective:   BP 132/90 (BP Location: Right Arm)   Pulse (!) 114   Temp 98.6 F (37 C) (Oral)   Resp 16   Ht 5\' 8"  (1.727 m)   Wt 99.8 kg (220 lb)   SpO2 94%   BMI 33.45 kg/m   Intake/Output Summary (Last 24 hours) at 02/13/2018 1211 Last data filed at 02/13/2018 0900 Gross per 24 hour  Intake 5720 ml  Output 950 ml  Net 4770 ml   Weight change:   Physical Exam: GEN appears uncomfortable still but better than yesterday HEENT EOMI PERRL NECK nO JVD PULM shallow breathing, clear anteriorly CV tachycardic no r/g ABD soft, TTP in epigastric and LUQ region EXT no LE edema NEURO AAO x 3 SKIN warm and dry, + tattoos    Imaging: Dg Abd Portable 1v  Result Date: 02/12/2018 CLINICAL DATA:  37 year old male with acute pancreatitis. EXAM: PORTABLE ABDOMEN - 1 VIEW COMPARISON:  CT Abdomen and Pelvis 02/11/2018. FINDINGS: Portable AP supine view at 1935 hours. Non obstructed bowel gas pattern. Smooth round 5 millimeter retained metallic foreign body again projects in the left upper quadrant. This was located just caudal to the spleen on the CT yesterday. No pneumoperitoneum is evident on this supine view. No acute osseous abnormality  identified. IMPRESSION: Nonobstructed bowel-gas pattern. Electronically Signed   By: Genevie Ann M.D.   On: 02/12/2018 19:49    Labs: BMET Recent Labs  Lab 02/11/18 0150 02/12/18 0353 02/12/18 1010 02/13/18 0625  NA 136 139 137 140  K 4.4 5.0 4.6 4.1  CL 102 105 106 109  CO2 23 27 25 24   GLUCOSE 119* 151* 146* 130*  BUN 16 22* 24* 14  CREATININE 1.06 3.38* 3.57* 1.50*  CALCIUM 8.9 8.0* 8.1* 8.0*   CBC Recent Labs  Lab 02/11/18 0150 02/12/18 0353 02/13/18 0625  WBC 15.6* 16.0* 10.9*  NEUTROABS 12.2*  --   --   HGB 16.8 13.7 11.9*  HCT 49.3 41.4 37.3*  MCV 88.8 92.2 94.9  PLT 234 186 150    Medications:    . diazepam  10 mg Oral Q12H  . enoxaparin (LOVENOX) injection  40 mg Subcutaneous Q24H  . fluvoxaMINE  200 mg Oral QHS  . folic acid  1 mg Oral Daily  . insulin aspart  0-15 Units Subcutaneous TID WC  . LORazepam  0-4 mg Intravenous Q12H  . multivitamin with minerals  1 tablet Oral Daily  . oxyCODONE  10 mg Oral Q12H  . senna-docusate  1 tablet Oral BID  . sodium chloride flush  3 mL Intravenous Q12H  . thiamine  100 mg  Oral Daily   Or  . thiamine  100 mg Intravenous Daily  . traZODone  100 mg Oral QHS      Madelon Lips, MD 02/13/2018, 12:11 PM
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
## 2018-02-13 NOTE — Progress Notes (Signed)
Patient ID: Glenn [name], male   DOB: 03/25/1981, 37 y.o.   MRN: [id number]       Subjective: Pt feels about the same.  Still having a lot of left-sided abdominal pain.  No nausea or vomiting.  Objective: Vital signs in last 24 hours: Temp:  [98.6 F (37 C)-99.6 F (37.6 C)] 98.6 F (37 C) (06/18 0550) Pulse Rate:  [113-121] 114 (06/18 0630) Resp:  [16-20] 16 (06/18 0550) BP: (83-132)/(54-90) 132/90 (06/18 0630) SpO2:  [90 %-94 %] 94 % (06/18 0550) Last BM Date: 02/10/18  Intake/Output from previous day: 06/17 0701 - 06/18 0700 In: 5690 [I.V.:4890; IV Piggyback:800] Out: 600 [Urine:600] Intake/Output this shift: No intake/output data recorded.  PE: Heart: tachy, but regular Lungs: CTAB Abd: soft, but fullness in the upper abdomen, tender in the LUQ and epigastrium, with some voluntary guarding.  +BS  Lab Results:  Recent Labs    02/12/18 0353 02/13/18 0625  WBC 16.0* 10.9*  HGB 13.7 11.9*  HCT 41.4 37.3*  PLT 186 150   BMET Recent Labs    02/12/18 1010 02/13/18 0625  NA 137 140  K 4.6 4.1  CL 106 109  CO2 25 24  GLUCOSE 146* 130*  BUN 24* 14  CREATININE 3.57* 1.50*  CALCIUM 8.1* 8.0*   PT/INR No results for input(s): LABPROT, INR in the last 72 hours. CMP     Component Value Date/Time   NA 140 02/13/2018 0625   K 4.1 02/13/2018 0625   CL 109 02/13/2018 0625   CO2 24 02/13/2018 0625   GLUCOSE 130 (H) 02/13/2018 0625   BUN 14 02/13/2018 0625   CREATININE 1.50 (H) 02/13/2018 0625   CALCIUM 8.0 (L) 02/13/2018 0625   PROT 5.5 (L) 02/13/2018 0625   ALBUMIN 2.4 (L) 02/13/2018 0625   AST 36 02/13/2018 0625   ALT 33 02/13/2018 0625   ALKPHOS 51 02/13/2018 0625   BILITOT 1.1 02/13/2018 0625   GFRNONAA 58 (L) 02/13/2018 0625   GFRAA >60 02/13/2018 0625   Lipase     Component Value Date/Time   LIPASE 46 02/13/2018 0625       Studies/Results: Dg Abd Portable 1v  Result Date: 02/12/2018 CLINICAL DATA:  37 year old male with acute pancreatitis.  EXAM: PORTABLE ABDOMEN - 1 VIEW COMPARISON:  CT Abdomen and Pelvis 02/11/2018. FINDINGS: Portable AP supine view at 1935 hours. Non obstructed bowel gas pattern. Smooth round 5 millimeter retained metallic foreign body again projects in the left upper quadrant. This was located just caudal to the spleen on the CT yesterday. No pneumoperitoneum is evident on this supine view. No acute osseous abnormality identified. IMPRESSION: Nonobstructed bowel-gas pattern. Electronically Signed   By: Genevie Ann M.D.   On: 02/12/2018 19:49    Anti-infectives: Anti-infectives (From admission, onward)   Start     Dose/Rate Route Frequency Ordered Stop   02/11/18 1400  piperacillin-tazobactam (ZOSYN) IVPB 3.375 g     3.375 g 12.5 mL/hr over 240 Minutes Intravenous Every 8 hours 02/11/18 1241     02/11/18 1200  piperacillin-tazobactam (ZOSYN) IVPB 3.375 g  Status:  Discontinued     3.375 g 100 mL/hr over 30 Minutes Intravenous Every 6 hours 02/11/18 1105 02/11/18 1240   02/11/18 0730  piperacillin-tazobactam (ZOSYN) IVPB 3.375 g  Status:  Discontinued     3.375 g 100 mL/hr over 30 Minutes Intravenous Every 6 hours 02/11/18 0723 02/11/18 1241       Assessment/Plan Pancreatitis secondary to ETOH abuse -patient's labs have  improved today along with his vitals after more aggressive fluid resuscitation overnight.  He is still having a lot of pain clinically which is not unexpected. -pain control per medicine -no surgical indications.  Medical management of pancreatitis ARF -much improved from yesterday with good fluid resuscitation.  Defer to medicine and renal  FEN - NPO VTE - Lovenox ID - zosyn  Dispo - no surgical intervention warranted in this case.  Continue medical management of his alcoholic pancreatitis.  We will sign off.   LOS: 2 days    Henreitta Cea , [facility] 02/13/2018, 7:45 AM Pager: [phone]

## 2018-02-14 DIAGNOSIS — K8591 Acute pancreatitis with uninfected necrosis, unspecified: Secondary | ICD-10-CM

## 2018-02-14 LAB — BASIC METABOLIC PANEL
Anion gap: 6 (ref 5–15)
BUN: 9 mg/dL (ref 6–20)
CALCIUM: 8.1 mg/dL — AB (ref 8.9–10.3)
CO2: 24 mmol/L (ref 22–32)
Chloride: 109 mmol/L (ref 101–111)
Creatinine, Ser: 1.15 mg/dL (ref 0.61–1.24)
GLUCOSE: 96 mg/dL (ref 65–99)
POTASSIUM: 4.2 mmol/L (ref 3.5–5.1)
Sodium: 139 mmol/L (ref 135–145)

## 2018-02-14 LAB — GLUCOSE, CAPILLARY
GLUCOSE-CAPILLARY: 91 mg/dL (ref 65–99)
GLUCOSE-CAPILLARY: 83 mg/dL (ref 65–99)
GLUCOSE-CAPILLARY: 132 mg/dL — AB (ref 65–99)
Glucose-Capillary: 137 mg/dL — ABNORMAL HIGH (ref 65–99)

## 2018-02-14 LAB — CBC
HEMATOCRIT: 33.8 % — AB (ref 39.0–52.0)
Hemoglobin: 10.9 g/dL — ABNORMAL LOW (ref 13.0–17.0)
MCH: 30.4 pg (ref 26.0–34.0)
MCHC: 32.2 g/dL (ref 30.0–36.0)
MCV: 94.2 fL (ref 78.0–100.0)
Platelets: 167 10*3/uL (ref 150–400)
RBC: 3.59 MIL/uL — AB (ref 4.22–5.81)
RDW: 12.9 % (ref 11.5–15.5)
WBC: 10.4 10*3/uL (ref 4.0–10.5)

## 2018-02-14 LAB — MAGNESIUM: Magnesium: 1.9 mg/dL (ref 1.7–2.4)

## 2018-02-14 MED ORDER — ACETAMINOPHEN 325 MG PO TABS
650.0000 mg | ORAL_TABLET | Freq: Once | ORAL | Status: AC
  Administered 2018-02-14: 650 mg via ORAL
  Filled 2018-02-14: qty 2

## 2018-02-14 MED ORDER — DIAZEPAM 5 MG PO TABS
5.0000 mg | ORAL_TABLET | Freq: Every day | ORAL | Status: DC
  Administered 2018-02-15: 5 mg via ORAL
  Filled 2018-02-14: qty 1

## 2018-02-14 NOTE — Progress Notes (Signed)
PROGRESS NOTE  Glenn [name] GYK:[id number] DOB: [birth year] DOA: 02/11/2018 PCP: Marin Olp, MD  HPI/Recap of past 24 hours: Glenn [name] is a 37 year old male with past medical history significant for alcohol abuse, hypertension, chronic anxiety/depression who presented to the ED with worsening abdominal pain.  Abdomen and pelvis with contrast done on 02/11/2018 revealed acute pancreatitis with hypoenhancement concerning for early necrotizing pancreatitis.  Admitted for acute alcoholic necrotizing pancreatitis.  02/12/2018: Patient seen and examined at his bedside.  He reports moderate to severe abdominal pain.  Pain management in place.  General surgery consulted.    Also presented with AKI with creatinine of 3.57 and trending up with no prior history of CKD.  Minimal urine output this morning.  Ordered bladder scan.  Nephrology contacted for consult.  Highly appreciated.  02/13/2018: Patient seen and examined at his bedside.  With aggressive supportive care, blood pressure and creatinine almost back to baseline.  He is doing better today.  Nephrology and surgery saw the patient in consult and signed off, highly appreciate input.  No new complaints.  Continue supportive care.  Subjective: -Patient reported feeling better today, no nausea, no vomiting, abdominal pain is minimal  Assessment/Plan: Active Problems:   Panic attacks   Essential hypertension, benign   Tobacco abuse   Obesity   Acute alcoholic pancreatitis   Acute necrotizing pancreatitis   Hepatic steatosis   Hyperglycemia   Alcohol withdrawal syndrome (HCC)   Alcoholism /alcohol abuse (HCC)   Pancreatitis, alcoholic, acute  Severe sepsis secondary to acute alcoholic necrotizing pancreatitis -Sepsis has resolved, continue with IV vancomycin, continue with aggressive IV fluid at 200 cc/h, appears to be improving, no further nausea or vomiting, abdomen is less tender today, will start on clear liquid diet and advance as  tolerated. - Still requiring IV Dilaudid, On OxyContin twice daily - Continue bowel regimen Senokot twice daily  AKI with no prior history of CKD -Appears to be ATN related to hypovolemia and acute pancreatitis, he is improving, renal function back to baseline, avoid nephrotoxic medication continue to monitor BMP closely   Alcohol abuse with concern for withdrawal -New CIWA protocol, no evidence of withdrawals  Anxiety/depression - Continue valium as needed - Continue trazodone qhs  Hypertension, stable - Continue to hold losartan due to AKI and recent hypotension  Resume losartan when he returns to his baseline and AKI has resolved   Code Status: Full code  Family Communication: None at bedside  Disposition Plan: When clinically stable.  Possibly in the next 24-48 hours once he no longer requires IV pain medications and is tolerating a soft diet.   Consultants:  Nephrology  General surgery  Procedures:  None  Antimicrobials:  IV Zosyn  DVT prophylaxis: Heparin subcu 3 times daily   Objective: Vitals:   02/13/18 1322 02/13/18 2124 02/14/18 0530 02/14/18 1512  BP: (!) 142/85 (!) 142/100 137/90 (!) 143/93  Pulse: (!) 109 (!) 110 (!) 102 (!) 109  Resp:  16 16 18   Temp: 97.6 F (36.4 C) 99.6 F (37.6 C) 98.3 F (36.8 C) 98.5 F (36.9 C)  TempSrc: Oral Oral Oral Oral  SpO2: 95% 95% 96% 98%  Weight:      Height:        Intake/Output Summary (Last 24 hours) at 02/14/2018 1618 Last data filed at 02/14/2018 0800 Gross per 24 hour  Intake 130.6 ml  Output 700 ml  Net -569.4 ml   Filed Weights   02/11/18 0114  Weight: 99.8  kg (220 lb)    Exam:  Awake Alert, Oriented X 3, No new F.N deficits, Normal affect Symmetrical Chest wall movement, Good air movement bilaterally, CTAB RRR,No Gallops,Rubs or new Murmurs, No Parasternal Heave +ve B.Sounds, Abd Soft, minimal tenderness to palpation and left epigastric area no tenderness, No rebound - guarding or  rigidity. No Cyanosis, Clubbing or edema, No new Rash or bruise      Data Reviewed: CBC: Recent Labs  Lab 02/11/18 0150 02/12/18 0353 02/13/18 0625 02/14/18 0615  WBC 15.6* 16.0* 10.9* 10.4  NEUTROABS 12.2*  --   --   --   HGB 16.8 13.7 11.9* 10.9*  HCT 49.3 41.4 37.3* 33.8*  MCV 88.8 92.2 94.9 94.2  PLT 234 186 150 195   Basic Metabolic Panel: Recent Labs  Lab 02/11/18 0150 02/12/18 0353 02/12/18 1010 02/13/18 0625 02/14/18 0615  NA 136 139 137 140 139  K 4.4 5.0 4.6 4.1 4.2  CL 102 105 106 109 109  CO2 23 27 25 24 24   GLUCOSE 119* 151* 146* 130* 96  BUN 16 22* 24* 14 9  CREATININE 1.06 3.38* 3.57* 1.50* 1.15  CALCIUM 8.9 8.0* 8.1* 8.0* 8.1*  MG  --   --   --   --  1.9   GFR: Estimated Creatinine Clearance: 101.7 mL/min (by C-G formula based on SCr of 1.15 mg/dL). Liver Function Tests: Recent Labs  Lab 02/11/18 0150 02/12/18 0353 02/13/18 0625  AST 131* 46* 36  ALT 88* 46 33  ALKPHOS 70 49 51  BILITOT 1.0 1.3* 1.1  PROT 6.9 5.7* 5.5*  ALBUMIN 3.7 2.8* 2.4*   Recent Labs  Lab 02/11/18 0150 02/13/18 0625  LIPASE 680* 46   No results for input(s): AMMONIA in the last 168 hours. Coagulation Profile: No results for input(s): INR, PROTIME in the last 168 hours. Cardiac Enzymes: No results for input(s): CKTOTAL, CKMB, CKMBINDEX, TROPONINI in the last 168 hours. BNP (last 3 results) No results for input(s): PROBNP in the last 8760 hours. HbA1C: No results for input(s): HGBA1C in the last 72 hours. CBG: Recent Labs  Lab 02/13/18 1226 02/13/18 1630 02/13/18 2126 02/14/18 0746 02/14/18 1157  GLUCAP 117* 115* 107* 91 83   Lipid Profile: No results for input(s): CHOL, HDL, LDLCALC, TRIG, CHOLHDL, LDLDIRECT in the last 72 hours. Thyroid Function Tests: No results for input(s): TSH, T4TOTAL, FREET4, T3FREE, THYROIDAB in the last 72 hours. Anemia Panel: No results for input(s): VITAMINB12, FOLATE, FERRITIN, TIBC, IRON, RETICCTPCT in the last 72  hours. Urine analysis:    Component Value Date/Time   COLORURINE YELLOW 02/11/2018 0253   APPEARANCEUR CLEAR 02/11/2018 0253   LABSPEC 1.024 02/11/2018 0253   PHURINE 5.0 02/11/2018 0253   GLUCOSEU NEGATIVE 02/11/2018 0253   HGBUR NEGATIVE 02/11/2018 0253   BILIRUBINUR NEGATIVE 02/11/2018 0253   BILIRUBINUR Negative 09/04/2017 1339   KETONESUR NEGATIVE 02/11/2018 0253   PROTEINUR NEGATIVE 02/11/2018 0253   UROBILINOGEN 0.2 09/04/2017 1339   NITRITE NEGATIVE 02/11/2018 0253   LEUKOCYTESUR NEGATIVE 02/11/2018 0253   Sepsis Labs: @LABRCNTIP (procalcitonin:4,lacticidven:4)  ) Recent Results (from the past 240 hour(s))  Culture, blood (routine x 2)     Status: None (Preliminary result)   Collection Time: 02/12/18 10:10 AM  Result Value Ref Range Status   Specimen Description BLOOD RIGHT ARM  Final   Special Requests   Final    BOTTLES DRAWN AEROBIC AND ANAEROBIC Blood Culture adequate volume   Culture NO GROWTH 2 DAYS  Final  Report Status PENDING  Incomplete  Culture, blood (routine x 2)     Status: None (Preliminary result)   Collection Time: 02/12/18 12:41 PM  Result Value Ref Range Status   Specimen Description BLOOD RIGHT HAND  Final   Special Requests   Final    BOTTLES DRAWN AEROBIC AND ANAEROBIC Blood Culture adequate volume Performed at [facility], [place] [address], [place], [place] [address]    Culture NO GROWTH 2 DAYS  Final   Report Status PENDING  Incomplete      Studies: No results found.  Scheduled Meds: . [START ON 02/15/2018] diazepam  5 mg Oral Daily  . enoxaparin (LOVENOX) injection  40 mg Subcutaneous Q24H  . fluvoxaMINE  200 mg Oral QHS  . folic acid  1 mg Oral Daily  . insulin aspart  0-15 Units Subcutaneous TID WC  . LORazepam  0-4 mg Intravenous Q12H  . multivitamin with minerals  1 tablet Oral Daily  . oxyCODONE  10 mg Oral Q12H  . senna-docusate  1 tablet Oral BID  . sodium chloride flush  3 mL Intravenous Q12H  . thiamine  100 mg  Oral Daily   Or  . thiamine  100 mg Intravenous Daily  . traZODone  100 mg Oral QHS    Continuous Infusions: . sodium chloride 200 mL/hr at 02/14/18 1245  . sodium chloride    . piperacillin-tazobactam (ZOSYN)  IV 3.375 g (02/14/18 1422)  . banana bag IV 1000 mL 150 mL/hr at 02/13/18 1929     LOS: 3 days     Glenn Climes, MD [facility] Pager [phone]  If 7PM-7AM, please contact night-coverage [email or web address] Password [email or web address] 02/14/2018, 4:18 PM

## 2018-02-14 NOTE — Plan of Care (Signed)
Neuro: Pt A&OX4, neuro exam WNL. Will continue to monitor.    Respiratory: Pt on RA with o2 sats WNL.   Cardiovascular: Pt with tmax of 100.3, tylenol given. Other VSS at this time.    GI/GU: Pt voiding in urinal, on clear liquid diet at this time and tolerating PO intake at this time.    Skin: Skin intact with no s/s of skin breakdown at this time. Pt able to reposition independently.    Pain: Pt c/o abd pain and receiving scheduled and PRN medications. Pt reporting adequate relief with current regimen.   Events: NO acute events throughout shift. Pts plan of care to continue with current regimen, Family updated and no further questions at this time.

## 2018-02-14 NOTE — Plan of Care (Signed)
  Problem: Pain Managment: Goal: General experience of comfort will improve Outcome: Progressing   

## 2018-02-15 DIAGNOSIS — K8522 Alcohol induced acute pancreatitis with infected necrosis: Secondary | ICD-10-CM

## 2018-02-15 LAB — CBC
HCT: 33.3 % — ABNORMAL LOW (ref 39.0–52.0)
Hemoglobin: 10.9 g/dL — ABNORMAL LOW (ref 13.0–17.0)
MCH: 30 pg (ref 26.0–34.0)
MCHC: 32.7 g/dL (ref 30.0–36.0)
MCV: 91.7 fL (ref 78.0–100.0)
PLATELETS: 198 10*3/uL (ref 150–400)
RBC: 3.63 MIL/uL — AB (ref 4.22–5.81)
RDW: 12.6 % (ref 11.5–15.5)
WBC: 9.6 10*3/uL (ref 4.0–10.5)

## 2018-02-15 LAB — COMPREHENSIVE METABOLIC PANEL
ALT: 59 U/L (ref 17–63)
AST: 68 U/L — AB (ref 15–41)
Albumin: 2.1 g/dL — ABNORMAL LOW (ref 3.5–5.0)
Alkaline Phosphatase: 80 U/L (ref 38–126)
Anion gap: 8 (ref 5–15)
BUN: 6 mg/dL (ref 6–20)
CHLORIDE: 106 mmol/L (ref 101–111)
CO2: 26 mmol/L (ref 22–32)
CREATININE: 1.01 mg/dL (ref 0.61–1.24)
Calcium: 8 mg/dL — ABNORMAL LOW (ref 8.9–10.3)
GFR calc non Af Amer: >60 mL/min (ref >60)
Glucose, Bld: 118 mg/dL — ABNORMAL HIGH (ref 65–99)
POTASSIUM: 3.4 mmol/L — AB (ref 3.5–5.1)
SODIUM: 140 mmol/L (ref 135–145)
Total Bilirubin: 0.7 mg/dL (ref 0.3–1.2)
Total Protein: 5.4 g/dL — ABNORMAL LOW (ref 6.5–8.1)

## 2018-02-15 LAB — GLUCOSE, CAPILLARY
GLUCOSE-CAPILLARY: 100 mg/dL — AB (ref 65–99)
GLUCOSE-CAPILLARY: 90 mg/dL (ref 65–99)
Glucose-Capillary: 113 mg/dL — ABNORMAL HIGH (ref 65–99)
Glucose-Capillary: 121 mg/dL — ABNORMAL HIGH (ref 65–99)

## 2018-02-15 MED ORDER — OXYCODONE HCL 5 MG PO TABS
5.0000 mg | ORAL_TABLET | ORAL | Status: DC | PRN
Start: 2018-02-15 — End: 2018-02-17
  Administered 2018-02-15 – 2018-02-17 (×10): 5 mg via ORAL
  Filled 2018-02-15 (×10): qty 1

## 2018-02-15 MED ORDER — AMLODIPINE BESYLATE 5 MG PO TABS
10.0000 mg | ORAL_TABLET | Freq: Every day | ORAL | Status: DC
  Administered 2018-02-15 – 2018-02-17 (×3): 10 mg via ORAL
  Filled 2018-02-15 (×2): qty 2
  Filled 2018-02-15: qty 4

## 2018-02-15 NOTE — Progress Notes (Signed)
PROGRESS NOTE  Glenn [name] OMB:[id number] DOB: [birth year] DOA: 02/11/2018 PCP: Marin Olp, MD  HPI/Recap of past 24 hours: Glenn [name] is a 37 year old male with past medical history significant for alcohol abuse, hypertension, chronic anxiety/depression who presented to the ED with worsening abdominal pain.  Abdomen and pelvis with contrast done on 02/11/2018 revealed acute pancreatitis with hypoenhancement concerning for early necrotizing pancreatitis.  Admitted for acute alcoholic necrotizing pancreatitis  Subjective: -Report is feeling better today, tolerating clear liquid diet, no nausea, no vomiting, abdominal pain is controlled  Assessment/Plan: Active Problems:   Panic attacks   Essential hypertension, benign   Tobacco abuse   Obesity   Acute alcoholic pancreatitis   Acute necrotizing pancreatitis   Hepatic steatosis   Hyperglycemia   Alcohol withdrawal syndrome (HCC)   Alcoholism /alcohol abuse ([place])   Pancreatitis, alcoholic, acute  Severe sepsis secondary to acute alcoholic necrotizing pancreatitis -Sepsis has resolved, -She is still having low-grade temperatures, will continue empirically with IV Zosyn. -No further nausea or vomiting, abdominal pain is minimal, and tolerating clear liquid diet, I will advance to full liquid diet today, he is net 10 L positive during hospital stay, I will decrease his IV fluids to 125 cc/h, I will change his IV Dilaudid to p.o. Oxycodone. - Continue bowel regimen Senokot twice daily  AKI with no prior history of CKD -Appears to be ATN related to hypovolemia and acute pancreatitis, he is improving, renal function back to baseline, avoid nephrotoxic medication continue to monitor BMP closely   Alcohol abuse with concern for withdrawal -New CIWA protocol, no evidence of withdrawals  Transaminitis -Cutting of alcohol abuse, significantly improved, only AST mildly elevated at 68 currently, which is typical for alcohol abuse  pattern  Anxiety/depression - Continue valium as needed - Continue trazodone qhs  Hypertension, stable - Continue to hold losartan due to AKI and recent hypotension , pressure started to increase, will start on amlodipine    Code Status: Full code  Family Communication: None at bedside  Disposition Plan: When clinically stable.  Possibly in the next 24-48 hours once he no longer requires IV pain medications and is tolerating a soft diet.   Consultants:  Nephrology  General surgery  Procedures:  None  Antimicrobials:  IV Zosyn  DVT prophylaxis: Heparin subcu 3 times daily   Objective: Vitals:   02/14/18 1700 02/14/18 2139 02/15/18 0551 02/15/18 1317  BP:  (!) 163/102 (!) 167/98 (!) 169/101  Pulse:  94 87 79  Resp:  16 17 20   Temp: 100.3 F (37.9 C) 99 F (37.2 C) 99.8 F (37.7 C) 99.9 F (37.7 C)  TempSrc: Oral Oral Oral Oral  SpO2:  97% 96% 97%  Weight:      Height:        Intake/Output Summary (Last 24 hours) at 02/15/2018 1501 Last data filed at 02/15/2018 1450 Gross per 24 hour  Intake 5110.61 ml  Output 3150 ml  Net 1960.61 ml   Filed Weights   02/11/18 0114  Weight: 99.8 kg (220 lb)    Exam:  Awake Alert, Oriented X 3, No new F.N deficits, Normal affect Symmetrical Chest wall movement, Good air movement bilaterally, CTAB RRR,No Gallops,Rubs or new Murmurs, No Parasternal Heave +ve B.Sounds, Abd Soft, mild left epigastric tenderness, No rebound - guarding or rigidity. No Cyanosis, Clubbing or edema, No new Rash or bruise        Data Reviewed: CBC: Recent Labs  Lab 02/11/18 0150 02/12/18 0353 02/13/18 4536  02/14/18 0615 02/15/18 0653  WBC 15.6* 16.0* 10.9* 10.4 9.6  NEUTROABS 12.2*  --   --   --   --   HGB 16.8 13.7 11.9* 10.9* 10.9*  HCT 49.3 41.4 37.3* 33.8* 33.3*  MCV 88.8 92.2 94.9 94.2 91.7  PLT 234 186 150 167 387   Basic Metabolic Panel: Recent Labs  Lab 02/12/18 0353 02/12/18 1010 02/13/18 0625 02/14/18 0615  02/15/18 0653  NA 139 137 140 139 140  K 5.0 4.6 4.1 4.2 3.4*  CL 105 106 109 109 106  CO2 27 25 24 24 26   GLUCOSE 151* 146* 130* 96 118*  BUN 22* 24* 14 9 6   CREATININE 3.38* 3.57* 1.50* 1.15 1.01  CALCIUM 8.0* 8.1* 8.0* 8.1* 8.0*  MG  --   --   --  1.9  --    GFR: Estimated Creatinine Clearance: 115.8 mL/min (by C-G formula based on SCr of 1.01 mg/dL). Liver Function Tests: Recent Labs  Lab 02/11/18 0150 02/12/18 0353 02/13/18 0625 02/15/18 0653  AST 131* 46* 36 68*  ALT 88* 46 33 59  ALKPHOS 70 49 51 80  BILITOT 1.0 1.3* 1.1 0.7  PROT 6.9 5.7* 5.5* 5.4*  ALBUMIN 3.7 2.8* 2.4* 2.1*   Recent Labs  Lab 02/11/18 0150 02/13/18 0625  LIPASE 680* 46   No results for input(s): AMMONIA in the last 168 hours. Coagulation Profile: No results for input(s): INR, PROTIME in the last 168 hours. Cardiac Enzymes: No results for input(s): CKTOTAL, CKMB, CKMBINDEX, TROPONINI in the last 168 hours. BNP (last 3 results) No results for input(s): PROBNP in the last 8760 hours. HbA1C: No results for input(s): HGBA1C in the last 72 hours. CBG: Recent Labs  Lab 02/14/18 1157 02/14/18 1646 02/14/18 2200 02/15/18 0740 02/15/18 1200  GLUCAP 83 137* 132* 100* 113*   Lipid Profile: No results for input(s): CHOL, HDL, LDLCALC, TRIG, CHOLHDL, LDLDIRECT in the last 72 hours. Thyroid Function Tests: No results for input(s): TSH, T4TOTAL, FREET4, T3FREE, THYROIDAB in the last 72 hours. Anemia Panel: No results for input(s): VITAMINB12, FOLATE, FERRITIN, TIBC, IRON, RETICCTPCT in the last 72 hours. Urine analysis:    Component Value Date/Time   COLORURINE YELLOW 02/11/2018 0253   APPEARANCEUR CLEAR 02/11/2018 0253   LABSPEC 1.024 02/11/2018 0253   PHURINE 5.0 02/11/2018 0253   GLUCOSEU NEGATIVE 02/11/2018 0253   HGBUR NEGATIVE 02/11/2018 0253   BILIRUBINUR NEGATIVE 02/11/2018 0253   BILIRUBINUR Negative 09/04/2017 1339   KETONESUR NEGATIVE 02/11/2018 0253   PROTEINUR NEGATIVE  02/11/2018 0253   UROBILINOGEN 0.2 09/04/2017 1339   NITRITE NEGATIVE 02/11/2018 0253   LEUKOCYTESUR NEGATIVE 02/11/2018 0253   Sepsis Labs: @LABRCNTIP (procalcitonin:4,lacticidven:4)  ) Recent Results (from the past 240 hour(s))  Culture, blood (routine x 2)     Status: None (Preliminary result)   Collection Time: 02/12/18 10:10 AM  Result Value Ref Range Status   Specimen Description BLOOD RIGHT ARM  Final   Special Requests   Final    BOTTLES DRAWN AEROBIC AND ANAEROBIC Blood Culture adequate volume   Culture   Final    NO GROWTH 3 DAYS Performed at [facility], [place] [address], [place], [place] [address]    Report Status PENDING  Incomplete  Culture, blood (routine x 2)     Status: None (Preliminary result)   Collection Time: 02/12/18 12:41 PM  Result Value Ref Range Status   Specimen Description BLOOD RIGHT HAND  Final   Special Requests   Final  BOTTLES DRAWN AEROBIC AND ANAEROBIC Blood Culture adequate volume   Culture   Final    NO GROWTH 3 DAYS Performed at [facility], [place] [address], [place], [place] [address]    Report Status PENDING  Incomplete      Studies: No results found.  Scheduled Meds: . diazepam  5 mg Oral Daily  . enoxaparin (LOVENOX) injection  40 mg Subcutaneous Q24H  . fluvoxaMINE  200 mg Oral QHS  . folic acid  1 mg Oral Daily  . insulin aspart  0-15 Units Subcutaneous TID WC  . multivitamin with minerals  1 tablet Oral Daily  . oxyCODONE  10 mg Oral Q12H  . senna-docusate  1 tablet Oral BID  . sodium chloride flush  3 mL Intravenous Q12H  . thiamine  100 mg Oral Daily   Or  . thiamine  100 mg Intravenous Daily  . traZODone  100 mg Oral QHS    Continuous Infusions: . sodium chloride 200 mL/hr at 02/15/18 0551  . sodium chloride    . piperacillin-tazobactam (ZOSYN)  IV 3.375 g (02/15/18 1447)     LOS: 4 days     Phillips Climes, MD [facility] Pager [phone]  If 7PM-7AM, please contact  night-coverage [email or web address] Password [email or web address] 02/15/2018, 3:01 PM

## 2018-02-16 ENCOUNTER — Encounter (HOSPITAL_COMMUNITY): Payer: Self-pay | Admitting: *Deleted

## 2018-02-16 ENCOUNTER — Inpatient Hospital Stay (HOSPITAL_COMMUNITY): Payer: 59

## 2018-02-16 LAB — BASIC METABOLIC PANEL
Anion gap: 9 (ref 5–15)
BUN: 6 mg/dL (ref 6–20)
CALCIUM: 8 mg/dL — AB (ref 8.9–10.3)
CO2: 25 mmol/L (ref 22–32)
Chloride: 105 mmol/L (ref 101–111)
Creatinine, Ser: 1.05 mg/dL (ref 0.61–1.24)
GLUCOSE: 117 mg/dL — AB (ref 65–99)
POTASSIUM: 3 mmol/L — AB (ref 3.5–5.1)
SODIUM: 139 mmol/L (ref 135–145)

## 2018-02-16 LAB — CBC
HCT: 31.7 % — ABNORMAL LOW (ref 39.0–52.0)
Hemoglobin: 10.7 g/dL — ABNORMAL LOW (ref 13.0–17.0)
MCH: 30.1 pg (ref 26.0–34.0)
MCHC: 33.8 g/dL (ref 30.0–36.0)
MCV: 89.3 fL (ref 78.0–100.0)
PLATELETS: 249 10*3/uL (ref 150–400)
RBC: 3.55 MIL/uL — AB (ref 4.22–5.81)
RDW: 12.5 % (ref 11.5–15.5)
WBC: 9 10*3/uL (ref 4.0–10.5)

## 2018-02-16 LAB — GLUCOSE, CAPILLARY
GLUCOSE-CAPILLARY: 103 mg/dL — AB (ref 65–99)
Glucose-Capillary: 122 mg/dL — ABNORMAL HIGH (ref 65–99)
Glucose-Capillary: 125 mg/dL — ABNORMAL HIGH (ref 65–99)
Glucose-Capillary: 149 mg/dL — ABNORMAL HIGH (ref 65–99)

## 2018-02-16 MED ORDER — FUROSEMIDE 10 MG/ML IJ SOLN
40.0000 mg | Freq: Once | INTRAMUSCULAR | Status: AC
  Administered 2018-02-16: 40 mg via INTRAVENOUS
  Filled 2018-02-16: qty 4

## 2018-02-16 MED ORDER — DIAZEPAM 5 MG PO TABS
5.0000 mg | ORAL_TABLET | Freq: Once | ORAL | Status: AC
  Administered 2018-02-16: 5 mg via ORAL
  Filled 2018-02-16: qty 1

## 2018-02-16 MED ORDER — POTASSIUM CHLORIDE CRYS ER 20 MEQ PO TBCR
40.0000 meq | EXTENDED_RELEASE_TABLET | ORAL | Status: AC
  Administered 2018-02-16 (×2): 40 meq via ORAL
  Filled 2018-02-16 (×2): qty 2

## 2018-02-16 MED ORDER — IOHEXOL 300 MG/ML  SOLN
100.0000 mL | Freq: Once | INTRAMUSCULAR | Status: AC
  Administered 2018-02-16: 100 mL via INTRAVENOUS

## 2018-02-16 MED ORDER — HYDRALAZINE HCL 20 MG/ML IJ SOLN: 10.0000 mg | Freq: Four times a day (QID) | INTRAMUSCULAR | Status: DC | PRN

## 2018-02-16 MED ORDER — IOPAMIDOL (ISOVUE-300) INJECTION 61%
30.0000 mL | INTRAVENOUS | Status: AC
  Administered 2018-02-16 (×2): 30 mL via ORAL

## 2018-02-16 MED ORDER — IOPAMIDOL (ISOVUE-300) INJECTION 61%
INTRAVENOUS | Status: AC
  Filled 2018-02-16: qty 30

## 2018-02-16 MED ORDER — DIAZEPAM 5 MG PO TABS: 5.0000 mg | ORAL_TABLET | Freq: Two times a day (BID) | ORAL | Status: DC | PRN

## 2018-02-16 NOTE — Progress Notes (Signed)
PROGRESS NOTE  Glenn [name] QZE:[id number] DOB: 03-13-1981 DOA: 02/11/2018 PCP: Marin Olp, MD  HPI/Recap of past 24 hours: Mr. Glenn [name] is a 37 year old male with past medical history significant for alcohol abuse, hypertension, chronic anxiety/depression who presented to the ED with worsening abdominal pain.  Abdomen and pelvis with contrast done on 02/11/2018 revealed acute pancreatitis with hypoenhancement concerning for early necrotizing pancreatitis.  Admitted for acute alcoholic necrotizing pancreatitis  Subjective: -Tolerating full liquid diet, no nausea, no vomiting, he still having low-grade temperatures .  Assessment/Plan: Active Problems:   Panic attacks   Essential hypertension, benign   Tobacco abuse   Obesity   Acute alcoholic pancreatitis   Acute necrotizing pancreatitis   Hepatic steatosis   Hyperglycemia   Alcohol withdrawal syndrome (HCC)   Alcoholism /alcohol abuse (HCC)   Pancreatitis, alcoholic, acute  Severe sepsis secondary to acute alcoholic necrotizing pancreatitis -Sepsis has resolved, -She is still having low-grade temperatures, will continue empirically with IV Zosyn. -No further nausea or vomiting, abdominal pain is minimal, and to full liquid diet which she is tolerating, so I will advance to low-fat diet today . -He still having low-grade temperatures, I have repeated CT abdomen pelvis today, which was significant for necrosis at the tail of pancreas, but no evidence of abscess, cavity or fluid collection . -Currently with significant volume overload, I will DC his IV fluid and give 1 dose of Lasix .   AKI with no prior history of CKD -Appears to be ATN related to hypovolemia and acute pancreatitis, he is improving, renal function back to baseline, avoid nephrotoxic medication continue to monitor BMP closely   Alcohol abuse with concern for withdrawal -New CIWA protocol, no evidence of withdrawals  Transaminitis -Cutting of alcohol abuse,  significantly improved, only AST mildly elevated at 68 currently, which is typical for alcohol abuse pattern  Anxiety/depression - Continue valium as needed - Continue trazodone qhs  Hypertension, stable - Continue to hold losartan due to AKI and recent hypotension , pressure started to increase, will start on amlodipine  Hypokalemia, -Repleted, recheck in a.m.   Code Status: Full code  Family Communication: None at bedside  Disposition Plan: Home in a.m. if he remains afebrile   Consultants:  Nephrology  General surgery  Procedures:  None  Antimicrobials:  IV Zosyn  DVT prophylaxis: Heparin subcu 3 times daily   Objective: Vitals:   02/15/18 1317 02/15/18 2201 02/16/18 0453 02/16/18 1406  BP: (!) 169/101 (!) 147/83 (!) 155/94 (!) 150/96  Pulse: 79 81 77 86  Resp: 20 17 18 15   Temp: 99.9 F (37.7 C) 98.8 F (37.1 C) 98.8 F (37.1 C)   TempSrc: Oral Oral Oral   SpO2: 97% 94% 96% 97%  Weight:      Height:        Intake/Output Summary (Last 24 hours) at 02/16/2018 1456 Last data filed at 02/16/2018 0912 Gross per 24 hour  Intake 3321.16 ml  Output 600 ml  Net 2721.16 ml   Filed Weights   02/11/18 0114  Weight: 99.8 kg (220 lb)    Exam:  Awake Alert, Oriented X 3, No new F.N deficits, Normal affect Symmetrical Chest wall movement, Good air movement bilaterally, CTAB RRR,No Gallops,Rubs or new Murmurs, No Parasternal Heave +ve B.Sounds, Abd Soft, has left epigastric tenderness, No rebound - guarding or rigidity. No Cyanosis, Clubbing or edema, No new Rash or bruise         Data Reviewed: CBC: Recent Labs  Lab  02/11/18 0150 02/12/18 0353 02/13/18 0625 02/14/18 0615 02/15/18 0653 02/16/18 0739  WBC 15.6* 16.0* 10.9* 10.4 9.6 9.0  NEUTROABS 12.2*  --   --   --   --   --   HGB 16.8 13.7 11.9* 10.9* 10.9* 10.7*  HCT 49.3 41.4 37.3* 33.8* 33.3* 31.7*  MCV 88.8 92.2 94.9 94.2 91.7 89.3  PLT 234 186 150 167 198 947   Basic Metabolic  Panel: Recent Labs  Lab 02/12/18 1010 02/13/18 0625 02/14/18 0615 02/15/18 0653 02/16/18 0739  NA 137 140 139 140 139  K 4.6 4.1 4.2 3.4* 3.0*  CL 106 109 109 106 105  CO2 25 24 24 26 25   GLUCOSE 146* 130* 96 118* 117*  BUN 24* 14 9 6 6   CREATININE 3.57* 1.50* 1.15 1.01 1.05  CALCIUM 8.1* 8.0* 8.1* 8.0* 8.0*  MG  --   --  1.9  --   --    GFR: Estimated Creatinine Clearance: 111.4 mL/min (by C-G formula based on SCr of 1.05 mg/dL). Liver Function Tests: Recent Labs  Lab 02/11/18 0150 02/12/18 0353 02/13/18 0625 02/15/18 0653  AST 131* 46* 36 68*  ALT 88* 46 33 59  ALKPHOS 70 49 51 80  BILITOT 1.0 1.3* 1.1 0.7  PROT 6.9 5.7* 5.5* 5.4*  ALBUMIN 3.7 2.8* 2.4* 2.1*   Recent Labs  Lab 02/11/18 0150 02/13/18 0625  LIPASE 680* 46   No results for input(s): AMMONIA in the last 168 hours. Coagulation Profile: No results for input(s): INR, PROTIME in the last 168 hours. Cardiac Enzymes: No results for input(s): CKTOTAL, CKMB, CKMBINDEX, TROPONINI in the last 168 hours. BNP (last 3 results) No results for input(s): PROBNP in the last 8760 hours. HbA1C: No results for input(s): HGBA1C in the last 72 hours. CBG: Recent Labs  Lab 02/15/18 1200 02/15/18 1714 02/15/18 2148 02/16/18 0750 02/16/18 1353  GLUCAP 113* 90 121* 122* 103*   Lipid Profile: No results for input(s): CHOL, HDL, LDLCALC, TRIG, CHOLHDL, LDLDIRECT in the last 72 hours. Thyroid Function Tests: No results for input(s): TSH, T4TOTAL, FREET4, T3FREE, THYROIDAB in the last 72 hours. Anemia Panel: No results for input(s): VITAMINB12, FOLATE, FERRITIN, TIBC, IRON, RETICCTPCT in the last 72 hours. Urine analysis:    Component Value Date/Time   COLORURINE YELLOW 02/11/2018 0253   APPEARANCEUR CLEAR 02/11/2018 0253   LABSPEC 1.024 02/11/2018 0253   PHURINE 5.0 02/11/2018 0253   GLUCOSEU NEGATIVE 02/11/2018 0253   HGBUR NEGATIVE 02/11/2018 0253   BILIRUBINUR NEGATIVE 02/11/2018 0253   BILIRUBINUR  Negative 09/04/2017 1339   KETONESUR NEGATIVE 02/11/2018 0253   PROTEINUR NEGATIVE 02/11/2018 0253   UROBILINOGEN 0.2 09/04/2017 1339   NITRITE NEGATIVE 02/11/2018 0253   LEUKOCYTESUR NEGATIVE 02/11/2018 0253   Sepsis Labs: @LABRCNTIP (procalcitonin:4,lacticidven:4)  ) Recent Results (from the past 240 hour(s))  Culture, blood (routine x 2)     Status: None (Preliminary result)   Collection Time: 02/12/18 10:10 AM  Result Value Ref Range Status   Specimen Description BLOOD RIGHT ARM  Final   Special Requests   Final    BOTTLES DRAWN AEROBIC AND ANAEROBIC Blood Culture adequate volume   Culture   Final    NO GROWTH 4 DAYS Performed at [facility], [place] [address], [place], [place] [address]    Report Status PENDING  Incomplete  Culture, blood (routine x 2)     Status: None (Preliminary result)   Collection Time: 02/12/18 12:41 PM  Result Value Ref Range Status  Specimen Description BLOOD RIGHT HAND  Final   Special Requests   Final    BOTTLES DRAWN AEROBIC AND ANAEROBIC Blood Culture adequate volume   Culture   Final    NO GROWTH 4 DAYS Performed at [facility], [address], [place], [place] [address]    Report Status PENDING  Incomplete      Studies: Ct Abdomen Pelvis W Contrast  Result Date: 02/16/2018 CLINICAL DATA:  Necrotizing pancreatitis with clinical deterioration. EXAM: CT ABDOMEN AND PELVIS WITH CONTRAST TECHNIQUE: Multidetector CT imaging of the abdomen and pelvis was performed using the standard protocol following bolus administration of intravenous contrast. CONTRAST:  114mL OMNIPAQUE IOHEXOL 300 MG/ML  SOLN COMPARISON:  02/11/2018 FINDINGS: Lower chest: New small left effusion with multi segment left lower lobe atelectasis. Trace right effusion. Normal heart size Hepatobiliary: Hepatic steatosis. No focal abnormalityno evidence of biliary obstruction or stone. Pancreas: Pancreatitis involving the tail which is poorly enhancing and defined. Negative  for collection. Edema tracks within the retroperitoneal fascia. The splenic vein remains enhancing. Spleen: Unremarkable. Adrenals/Urinary Tract: Negative adrenals. No hydronephrosis or stone. Unremarkable bladder. Stomach/Bowel:  No obstruction. No appendicitis. Vascular/Lymphatic: No acute vascular abnormality. No mass or adenopathy. Reproductive:No pathologic findings. Other: No ascites or pneumoperitoneum.  New body wall edema Musculoskeletal: No acute abnormalities. IMPRESSION: 1. Tail pancreatitis with possible necrosis. No cavitation or collection. 2. New small left and trace right pleural effusions. There is multi segment left lower lobe collapse. 3. New anasarca. 4. Hepatic steatosis. Electronically Signed   By: Monte Fantasia M.D.   On: 02/16/2018 12:49    Scheduled Meds: . amLODipine  10 mg Oral Daily  . enoxaparin (LOVENOX) injection  40 mg Subcutaneous Q24H  . fluvoxaMINE  200 mg Oral QHS  . folic acid  1 mg Oral Daily  . furosemide  40 mg Intravenous Once  . insulin aspart  0-15 Units Subcutaneous TID WC  . iopamidol      . multivitamin with minerals  1 tablet Oral Daily  . oxyCODONE  10 mg Oral Q12H  . senna-docusate  1 tablet Oral BID  . sodium chloride flush  3 mL Intravenous Q12H  . thiamine  100 mg Oral Daily   Or  . thiamine  100 mg Intravenous Daily  . traZODone  100 mg Oral QHS    Continuous Infusions: . sodium chloride    . piperacillin-tazobactam (ZOSYN)  IV 3.375 g (02/16/18 1344)     LOS: 5 days     Phillips Climes, MD [facility] Pager [phone]  If 7PM-7AM, please contact night-coverage [email or web address] Password [facility] 02/16/2018, 2:56 PM

## 2018-02-16 NOTE — Plan of Care (Signed)

## 2018-02-16 NOTE — Progress Notes (Signed)
Paged DR BP  159/106

## 2018-02-17 LAB — COMPREHENSIVE METABOLIC PANEL
ALK PHOS: 68 U/L (ref 38–126)
ALT: 65 U/L — AB (ref 17–63)
AST: 53 U/L — ABNORMAL HIGH (ref 15–41)
Albumin: 2.4 g/dL — ABNORMAL LOW (ref 3.5–5.0)
Anion gap: 10 (ref 5–15)
BILIRUBIN TOTAL: 0.7 mg/dL (ref 0.3–1.2)
BUN: 6 mg/dL (ref 6–20)
CALCIUM: 8.5 mg/dL — AB (ref 8.9–10.3)
CO2: 30 mmol/L (ref 22–32)
CREATININE: 1.01 mg/dL (ref 0.61–1.24)
Chloride: 102 mmol/L (ref 101–111)
GFR calc Af Amer: >60 mL/min (ref >60)
GFR calc non Af Amer: >60 mL/min (ref >60)
GLUCOSE: 128 mg/dL — AB (ref 65–99)
Potassium: 3.1 mmol/L — ABNORMAL LOW (ref 3.5–5.1)
SODIUM: 142 mmol/L (ref 135–145)
TOTAL PROTEIN: 6.2 g/dL — AB (ref 6.5–8.1)

## 2018-02-17 LAB — GLUCOSE, CAPILLARY
Glucose-Capillary: 110 mg/dL — ABNORMAL HIGH (ref 65–99)
Glucose-Capillary: 123 mg/dL — ABNORMAL HIGH (ref 65–99)

## 2018-02-17 LAB — CULTURE, BLOOD (ROUTINE X 2)
CULTURE: NO GROWTH
Culture: NO GROWTH
SPECIAL REQUESTS: ADEQUATE
Special Requests: ADEQUATE

## 2018-02-17 MED ORDER — FOLIC ACID 1 MG PO TABS: 1.0000 mg | ORAL_TABLET | Freq: Every day | ORAL | 0 refills | Status: DC

## 2018-02-17 MED ORDER — OXYCODONE HCL 5 MG PO TABS: 5.0000 mg | ORAL_TABLET | Freq: Four times a day (QID) | ORAL | 0 refills | Status: AC | PRN

## 2018-02-17 MED ORDER — AMOXICILLIN-POT CLAVULANATE 875-125 MG PO TABS: 1.0000 | ORAL_TABLET | Freq: Two times a day (BID) | ORAL | 0 refills | Status: DC

## 2018-02-17 MED ORDER — POTASSIUM CHLORIDE CRYS ER 20 MEQ PO TBCR
40.0000 meq | EXTENDED_RELEASE_TABLET | ORAL | Status: AC
  Administered 2018-02-17 (×2): 40 meq via ORAL
  Filled 2018-02-17 (×2): qty 2

## 2018-02-17 MED ORDER — AMLODIPINE BESYLATE 10 MG PO TABS: 10.0000 mg | ORAL_TABLET | Freq: Every day | ORAL | 0 refills | Status: DC

## 2018-02-17 MED ORDER — THIAMINE HCL 100 MG PO TABS: 100.0000 mg | ORAL_TABLET | Freq: Every day | ORAL | 0 refills | Status: DC

## 2018-02-17 NOTE — Discharge Instructions (Signed)
Follow with Primary MD Marin Olp, MD in 7 days   Get CBC, CMP,  checked  by Primary MD next visit.    Activity: As tolerated with Full fall precautions use walker/cane & assistance as needed   Disposition Home    Diet: Heart Healthy, low fat  .  On your next visit with your primary care physician please Get Medicines reviewed and adjusted.   Please request your Prim.MD to go over all Hospital Tests and Procedure/Radiological results at the follow up, please get all Hospital records sent to your Prim MD by signing hospital release before you go home.   If you experience worsening of your admission symptoms, develop shortness of breath, life threatening emergency, suicidal or homicidal thoughts you must seek medical attention immediately by calling 911 or calling your MD immediately  if symptoms less severe.  You Must read complete instructions/literature along with all the possible adverse reactions/side effects for all the Medicines you take and that have been prescribed to you. Take any new Medicines after you have completely understood and accpet all the possible adverse reactions/side effects.   Do not drive, operating heavy machinery, perform activities at heights, swimming or participation in water activities or provide baby sitting services if your were admitted for syncope or siezures until you have seen by Primary MD or a Neurologist and advised to do so again.  Do not drive when taking Pain medications.    Do not take more than prescribed Pain, Sleep and Anxiety Medications  Special Instructions: If you have smoked or chewed Tobacco  in the last 2 yrs please stop smoking, stop any regular Alcohol  and or any Recreational drug use.  Wear Seat belts while driving.   Please note  You were cared for by a hospitalist during your hospital stay. If you have any questions about your discharge medications or the care you received while you were in the hospital after you  are discharged, you can call the unit and asked to speak with the hospitalist on call if the hospitalist that took care of you is not available. Once you are discharged, your primary care physician will handle any further medical issues. Please note that NO REFILLS for any discharge medications will be authorized once you are discharged, as it is imperative that you return to your primary care physician (or establish a relationship with a primary care physician if you do not have one) for your aftercare needs so that they can reassess your need for medications and monitor your lab values.

## 2018-02-17 NOTE — Progress Notes (Signed)
Discharge instructions reviewed with Pt with and education provided on alcohol consumption avoidance.  Pt discharge home with friend picking him up.  Pt to leave ambulatory to front of building to meet his friend
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
## 2018-02-19 ENCOUNTER — Telehealth: Payer: Self-pay

## 2018-02-19 NOTE — Telephone Encounter (Signed)
Attempted to reach pt to complete TCM and schedule hospital follow up, no answer.

## 2018-02-20 ENCOUNTER — Encounter: Payer: Self-pay | Admitting: Family Medicine

## 2018-02-20 NOTE — Telephone Encounter (Signed)
Per chart review: Admission date:  02/11/2018  Admitting Physician  Lady Deutscher, MD  Discharge Date:  02/17/2018   Primary MD  Marin Olp, MD  Recommendations for primary care physician for things to follow:  -Check CBC, CMP during next visit, to ensure stable liver function, renal function, and no leukocytosis, continue counseling about alcohol cessation.   Admission Diagnosis  Acute necrotizing pancreatitis [K85.91]   Discharge Diagnosis  Acute necrotizing pancreatitis [K85.91]    Active Problems:   Panic attacks   Essential hypertension, benign   Tobacco abuse   Obesity   Acute alcoholic pancreatitis   Acute necrotizing pancreatitis   Hepatic steatosis   Hyperglycemia   Alcohol withdrawal syndrome (HCC)   Alcoholism /alcohol abuse ([place])   Pancreatitis, alcoholic, acute     _____________________________________________________________ Per telephone call: Transition Care Management Follow-up Telephone Call   Date discharged? 02/17/18   How have you been since you were released from the hospital? "still in some pain." Patient states he has been taking the prescribed Oxycodone and it has helped some. He states that he would like a prescription for a non-opioid pain medicine.   Do you understand why you were in the hospital? yes   Do you understand the discharge instructions? yes   Where were you discharged to? Home   Items Reviewed:  Medications reviewed: yes  Allergies reviewed: yes  Dietary changes reviewed: yes  Referrals reviewed: yes   Functional Questionnaire:   Activities of Daily Living (ADLs):   He states they are independent in the following: ambulation, bathing and hygiene, feeding, continence, grooming, toileting and dressing States they require assistance with the following: none   Any transportation issues/concerns?: no   Any patient concerns? no   Confirmed importance and date/time of follow-up visits scheduled  yes  Provider Appointment booked with Dr Yong Channel 02/22/18 3:45  Confirmed with patient if condition begins to worsen call PCP or go to the ER.  Patient was given the office number and encouraged to call back with question or concerns.  : yes

## 2018-02-20 NOTE — Telephone Encounter (Signed)
noted thanks- will discuss pain management at appointment

## 2018-02-21 MED ORDER — TRAMADOL HCL 50 MG PO TABS: 50.0000 mg | ORAL_TABLET | Freq: Four times a day (QID) | ORAL | 0 refills | Status: DC | PRN

## 2018-02-22 ENCOUNTER — Inpatient Hospital Stay: Payer: 59 | Admitting: Family Medicine

## 2018-02-22 ENCOUNTER — Encounter: Payer: Self-pay | Admitting: Family Medicine

## 2018-02-22 ENCOUNTER — Ambulatory Visit: Payer: 59 | Admitting: Family Medicine

## 2018-02-22 VITALS — BP 138/78 | HR 81 | Temp 97.8°F | Ht 68.0 in | Wt 207.6 lb

## 2018-02-22 DIAGNOSIS — F1020 Alcohol dependence, uncomplicated: Secondary | ICD-10-CM

## 2018-02-22 DIAGNOSIS — K8591 Acute pancreatitis with uninfected necrosis, unspecified: Secondary | ICD-10-CM | POA: Diagnosis not present

## 2018-02-22 DIAGNOSIS — I10 Essential (primary) hypertension: Secondary | ICD-10-CM | POA: Diagnosis not present

## 2018-02-22 DIAGNOSIS — N179 Acute kidney failure, unspecified: Secondary | ICD-10-CM | POA: Diagnosis not present

## 2018-02-22 DIAGNOSIS — IMO0001 Reserved for inherently not codable concepts without codable children: Secondary | ICD-10-CM

## 2018-02-22 LAB — COMPREHENSIVE METABOLIC PANEL
ALBUMIN: 3.9 g/dL (ref 3.5–5.2)
ALT: 37 U/L (ref 0–53)
AST: 27 U/L (ref 0–37)
Alkaline Phosphatase: 69 U/L (ref 39–117)
BILIRUBIN TOTAL: 0.4 mg/dL (ref 0.2–1.2)
BUN: 12 mg/dL (ref 6–23)
CO2: 32 meq/L (ref 19–32)
CREATININE: 1.03 mg/dL (ref 0.40–1.50)
Calcium: 9.5 mg/dL (ref 8.4–10.5)
Chloride: 98 mEq/L (ref 96–112)
GFR: 86.5 mL/min (ref >60.00)
Glucose, Bld: 101 mg/dL — ABNORMAL HIGH (ref 70–99)
Potassium: 4.1 mEq/L (ref 3.5–5.1)
SODIUM: 138 meq/L (ref 135–145)
Total Protein: 7.9 g/dL (ref 6.0–8.3)

## 2018-02-22 LAB — CBC WITH DIFFERENTIAL/PLATELET
BASOS ABS: 0.1 10*3/uL (ref 0.0–0.1)
Basophils Relative: 0.5 % (ref 0.0–3.0)
EOS ABS: 0.3 10*3/uL (ref 0.0–0.7)
Eosinophils Relative: 2.4 % (ref 0.0–5.0)
HEMATOCRIT: 40.1 % (ref 39.0–52.0)
Hemoglobin: 13.4 g/dL (ref 13.0–17.0)
LYMPHS PCT: 27.9 % (ref 12.0–46.0)
Lymphs Abs: 3.4 10*3/uL (ref 0.7–4.0)
MCHC: 33.3 g/dL (ref 30.0–36.0)
MCV: 89.7 fl (ref 78.0–100.0)
Monocytes Absolute: 0.6 10*3/uL (ref 0.1–1.0)
Monocytes Relative: 5 % (ref 3.0–12.0)
NEUTROS ABS: 7.8 10*3/uL — AB (ref 1.4–7.7)
NEUTROS PCT: 64.2 % (ref 43.0–77.0)
PLATELETS: 685 10*3/uL — AB (ref 150.0–400.0)
RBC: 4.47 Mil/uL (ref 4.22–5.81)
RDW: 13.1 % (ref 11.5–15.5)
WBC: 12.2 10*3/uL — ABNORMAL HIGH (ref 4.0–10.5)
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
## 2018-02-23 ENCOUNTER — Other Ambulatory Visit: Payer: Self-pay | Admitting: Family Medicine

## 2018-02-23 DIAGNOSIS — D75839 Thrombocytosis, unspecified: Secondary | ICD-10-CM

## 2018-02-23 DIAGNOSIS — D473 Essential (hemorrhagic) thrombocythemia: Secondary | ICD-10-CM

## 2018-02-25 ENCOUNTER — Encounter: Payer: Self-pay | Admitting: Family Medicine

## 2018-02-26 ENCOUNTER — Encounter: Payer: Self-pay | Admitting: Family Medicine

## 2018-02-26 ENCOUNTER — Other Ambulatory Visit: Payer: Self-pay | Admitting: Family Medicine

## 2018-02-26 MED ORDER — TRAMADOL HCL 50 MG PO TABS: 50.0000 mg | ORAL_TABLET | Freq: Four times a day (QID) | ORAL | 0 refills | Status: DC | PRN

## 2018-03-05 ENCOUNTER — Encounter: Payer: Self-pay | Admitting: Family Medicine

## 2018-03-06 ENCOUNTER — Ambulatory Visit (INDEPENDENT_AMBULATORY_CARE_PROVIDER_SITE_OTHER): Payer: 59

## 2018-03-06 ENCOUNTER — Ambulatory Visit (INDEPENDENT_AMBULATORY_CARE_PROVIDER_SITE_OTHER): Payer: 59 | Admitting: Sports Medicine

## 2018-03-06 ENCOUNTER — Encounter: Payer: Self-pay | Admitting: Family Medicine

## 2018-03-06 ENCOUNTER — Encounter: Payer: Self-pay | Admitting: Sports Medicine

## 2018-03-06 ENCOUNTER — Ambulatory Visit: Payer: Self-pay

## 2018-03-06 ENCOUNTER — Ambulatory Visit: Payer: 59 | Admitting: Family Medicine

## 2018-03-06 VITALS — BP 136/84 | HR 107 | Temp 98.6°F | Ht 68.0 in | Wt 215.8 lb

## 2018-03-06 DIAGNOSIS — M25561 Pain in right knee: Secondary | ICD-10-CM

## 2018-03-06 MED ORDER — COLCHICINE 0.6 MG PO CAPS: 1.0000 | ORAL_CAPSULE | Freq: Two times a day (BID) | ORAL | 0 refills | Status: DC | PRN

## 2018-03-06 MED ORDER — METHYLPREDNISOLONE 4 MG PO TBPK: ORAL_TABLET | ORAL | 0 refills | Status: DC
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
## 2018-03-06 NOTE — Procedures (Signed)
LIMITED MSK ULTRASOUND OF Right knee Images were obtained and interpreted by myself, Teresa Coombs, DO  Images have been saved and stored to PACS system. Images obtained on: GE S7 Ultrasound machine  FINDINGS:  Patella & Patellar Tendon: Normal Quad & Quad Tendon: Slight abnormality within the lateral fibers of the quadricep tendon.  Tracking fluid across the lateral joint line that is minimal. Suprapatellar Pouch: Minimal supraphysiologic effusion but this is mild.  Mild generalized synovitis Medial Joint Line: Normal medial meniscus Lateral Joint Line: Normal lateral meniscus Trochlea: Minimal degenerative change of the lateral portion of the trochlea but this is mild. Posterior knee: No appreciable Baker's cyst  IMPRESSION:  1. Possible small quadricep strain but otherwise normal exam of the knee.

## 2018-03-06 NOTE — Progress Notes (Signed)
Subjective:  Glenn [name] is a 37 y.o. year old very pleasant male patient who presents for/with See problem oriented charting ROS- no fever or chills. Some lingering abdominal pain but improving. Some redness over knee. Reports right knee pain.    Past Medical History-  Patient Active Problem List   Diagnosis Date Noted  . Tobacco abuse 02/24/2015    Priority: Medium  . Essential hypertension, benign 02/18/2014    Priority: Medium  . Panic attacks 12/10/2013    Priority: Medium  . Acute alcoholic pancreatitis 70/08/7492  . Acute necrotizing pancreatitis 02/11/2018  . Hepatic steatosis 02/11/2018  . Hyperglycemia 02/11/2018  . Alcohol withdrawal syndrome ([place]) 02/11/2018  . Alcoholism /alcohol abuse ([place]) 02/11/2018  . Pancreatitis, alcoholic, acute 49/67/5916  . Obesity 01/28/2017    Medications- reviewed and updated Current Outpatient Medications  Medication Sig Dispense Refill  . losartan (COZAAR) 100 MG tablet Take 100 mg by mouth daily.    . diazepam (VALIUM) 10 MG tablet Take 10 mg by mouth every 8 (eight) hours as needed for anxiety.     . fluvoxaMINE (LUVOX) 100 MG tablet Take 200 mg by mouth at bedtime. Through [facility]. 100mg  each evening    . folic acid (FOLVITE) 1 MG tablet Take 1 tablet (1 mg total) by mouth daily. 30 tablet 0  . thiamine 100 MG tablet Take 1 tablet (100 mg total) by mouth daily. 30 tablet 0  . traZODone (DESYREL) 100 MG tablet Take 100 mg by mouth at bedtime.      No current facility-administered medications for this visit.     Objective: BP 136/84 (BP Location: Left Arm, Patient Position: Sitting, Cuff Size: Normal)   Pulse (!) 107   Temp 98.6 F (37 C) (Oral)   Ht 5\' 8"  (1.727 m)   Wt 215 lb 12.8 oz (97.9 kg)   SpO2 95%   BMI 32.81 kg/m  Gen: NAD, appears uncomfortable. Bending over holding right knee as I enter room CV: RRR  Lungs:  no crackles, wheeze, rhonchi Ext: no edema Skin: warm, dry MSK: right knee pain with  palpation particularly over the patella- crepitus like sensation. Stable ligamentous testing. Minimal joint line pain. Mild redness over patella.   Assessment/Plan:  Right knee pain S:  noted knee pain 3 days into hospitalization last month when in 02/11/18. Pain has steadily worsened since that time Pain got severe within last 2-3 days. Pain 7/10. When pushes on it can get up to 9/10. Hurts to move the knee at all. Hurts the most over top of the knee cap. No fever or chills.  A/P: 37 year old male recovering from alcohol induced necrotizing pancreatitis last month now with right knee pain- concerns me for possible prepatellar bursitis (gout, infection as origin?). Will refer to Dr. Paulla Fore for his expert opinion. He does have an elevated WBC count as of 2 weeks ago when did not have elevated WBC count in hospital which also makes me question possible infection.   Future Appointments  Date Time Provider [place]  03/06/2018  3:40 PM Gerda Diss, DO [facility]  03/26/2018  3:00 PM [facility]  09/05/2018  8:15 AM Yong Channel Brayton Mars, MD [facility]   Acute pain of right knee - Plan: Ambulatory referral to Sports Medicine  Return precautions advised.  [name], MD

## 2018-03-07 LAB — COMPREHENSIVE METABOLIC PANEL
ALT: 20 U/L (ref 0–53)
AST: 23 U/L (ref 0–37)
Albumin: 4.4 g/dL (ref 3.5–5.2)
Alkaline Phosphatase: 83 U/L (ref 39–117)
BUN: 14 mg/dL (ref 6–23)
CHLORIDE: 100 meq/L (ref 96–112)
CO2: 30 mEq/L (ref 19–32)
Calcium: 9.4 mg/dL (ref 8.4–10.5)
Creatinine, Ser: 1.31 mg/dL (ref 0.40–1.50)
GFR: 65.53 mL/min (ref >60.00)
GLUCOSE: 145 mg/dL — AB (ref 70–99)
POTASSIUM: 4 meq/L (ref 3.5–5.1)
SODIUM: 139 meq/L (ref 135–145)
TOTAL PROTEIN: 7.7 g/dL (ref 6.0–8.3)
Total Bilirubin: 0.3 mg/dL (ref 0.2–1.2)

## 2018-03-07 LAB — URIC ACID: Uric Acid, Serum: 7.7 mg/dL (ref 4.0–7.8)

## 2018-03-12 ENCOUNTER — Encounter: Payer: Self-pay | Admitting: Sports Medicine

## 2018-03-12 ENCOUNTER — Encounter: Payer: Self-pay | Admitting: Family Medicine

## 2018-03-12 NOTE — Progress Notes (Signed)
Please touch base to make sure we get him to follow up

## 2018-03-16 ENCOUNTER — Ambulatory Visit: Payer: 59 | Admitting: Sports Medicine

## 2018-03-16 ENCOUNTER — Encounter: Payer: Self-pay | Admitting: Sports Medicine

## 2018-03-16 ENCOUNTER — Ambulatory Visit: Payer: Self-pay

## 2018-03-16 VITALS — BP 142/92 | HR 88 | Ht 68.0 in | Wt 207.6 lb

## 2018-03-16 DIAGNOSIS — E790 Hyperuricemia without signs of inflammatory arthritis and tophaceous disease: Secondary | ICD-10-CM

## 2018-03-16 DIAGNOSIS — M25561 Pain in right knee: Secondary | ICD-10-CM

## 2018-03-16 DIAGNOSIS — F410 Panic disorder [episodic paroxysmal anxiety] without agoraphobia: Secondary | ICD-10-CM | POA: Diagnosis not present

## 2018-03-16 NOTE — Progress Notes (Signed)
[name]. , [place] at [place] - 37 y.o. male MRN [id number]  Date of birth: 1980-12-14  Visit Date: 03/16/2018  PCP: Marin Olp, MD   Referred by: Marin Olp, MD  Scribe(s) for today's visit: Josepha Pigg, CMA  SUBJECTIVE:  Glenn [name] is here for Follow-up (R knee pain)   03/06/2018: His R knee pain symptoms INITIALLY: Began about 3 weeks ago in the ED and MOI is unknown.  Described as moderate (7/10) aching and throbbing, sharp/stabbing, nonradiating Worsened with lifting, bending, weight bearing Improved with rest and elevation.  Additional associated symptoms include: He feels the pain around the the whole knee joint but has pain with palpation just above the patella. There is redness and swelling around the joint. He denies clicking or popping. Per Dr. Yong Channel, there is a "cracking" sound when pushing on the bursa.   At this time symptoms are worsening compared to onset. He has been resting and elevating with knee with minimal relief. He has tried using IcyHot with no relief. He has been wearing compression brace around the knee.   No recent Xray of the R knee  03/16/2018: Compared to the last office visit, his previously described symptoms show no change. In the morning the pain is not "that bad" but flares up throughout the day.  Current symptoms are moderate & are radiating to the distal aspect of the knee. Initially the pain was on the proximal aspect of the knee.  He was prescribed Colchicine and Medrol Dosepak at his last visit. He feels that when he was on the medrol dosepak he was getting some relief. He has noticed occasional swelling around his knee when working. He also reports erythema around the knee.  He has been icing his knee and wearing a compression sleeve with some relief.   XR R knee 03/06/2018   REVIEW OF SYSTEMS: Denies night time disturbances. Denies  fevers, chills, or night sweats. Denies unexplained weight loss. Denies personal history of cancer. Denies changes in bowel or bladder habits. Denies recent unreported falls. Denies new or worsening dyspnea or wheezing. Denies headaches or dizziness.  Denies numbness, tingling or weakness  In the extremities.  Denies dizziness or presyncopal episodes Reports lower extremity edema    HISTORY & PERTINENT PRIOR DATA:  Prior History reviewed and updated per electronic medical record.  Significant/pertinent history, findings, studies include:  reports that he quit smoking about 6 years ago. His smoking use included cigarettes. He has a 18.00 pack-year smoking history. His smokeless tobacco use includes chew. Recent Labs    03/06/18 1513  LABURIC 7.7   No specialty comments available. Problem  Elevated Uric Acid in Blood    OBJECTIVE:  VS:  HT:5\' 8"  (172.7 cm)   WT:207 lb 9.6 oz (94.2 kg)  BMI:31.57    BP:(Abnormal) 142/92  HR:88bpm  TEMP: ( )  RESP:96 %   PHYSICAL EXAM: Constitutional: WDWN, Non-toxic appearing. Psychiatric: Alert & appropriately interactive.  Not depressed or anxious appearing. Respiratory: No increased work of breathing.  Trachea Midline Eyes: Pupils are equal.  EOM intact without nystagmus.  No scleral icterus  Vascular Exam: warm to touch no edema  lower extremity neuro exam: unremarkable normal sensation  MSK Exam: Right knee is overall well aligned with decreased swelling decreased erythema.  Still has some weakness with palpation of the superior and inferior patellar poles.  There is a questionable palpable  defect within the distal quadricep musculotendinous junction.  His flexor and extensor mechanism is intact although he has significant tremor with active motion secondary to weakness.  He is ligamentously stable.  There is no significant effusion.  Moderate synovitis.   ASSESSMENT & PLAN:   1. Acute pain of right knee   2. Elevated uric  acid in blood   3. Panic attacks     PLAN: Given he is continued to have ongoing issues and had only minimal improvement with colchicine and steroids further diagnostic evaluation with MRI of the right knee is indicated at this time.  Previously had only minimal effusion but did have some stranding within the lateral quadricep tendon as well as directly over the patella and I would like to rule out potential partial quad tear and/or possible patellar fracture.   We discussed that his uric acid level was borderline high and this may represent gout but given the lack of improvement on colchicine and prednisone I am going to defer treatment for this until further diagnostic evaluation with MRI is obtained.  This can be considered and uric acid levels can be rechecked if any lack of structural etiology on MRI.  Has Valium for the MRI due to his Panic Attacks  Follow-up: Return for MRI results review.      Please see additional documentation for Objective, Assessment and Plan sections. Pertinent additional documentation may be included in corresponding procedure notes, imaging studies, problem based documentation and patient instructions. Please see these sections of the encounter for additional information regarding this visit.  CMA/ATC served as Education administrator during this visit. History, Physical, and Plan performed by medical provider. Documentation and orders reviewed and attested to.      Gerda Diss, [facility] Physician

## 2018-03-16 NOTE — Patient Instructions (Signed)

## 2018-03-19 ENCOUNTER — Encounter: Payer: Self-pay | Admitting: Sports Medicine

## 2018-03-21 ENCOUNTER — Telehealth: Payer: Self-pay | Admitting: Sports Medicine

## 2018-03-21 NOTE — Telephone Encounter (Signed)
Copied from [place] [id number]. Topic: General - Other >> Mar 21, 2018  7:22 PM Cecelia Byars, NT wrote: Reason for CRM: The patient called and said his knee has been hurting  for the past month and would like something for pain please advise  [phone]  And he would also like to follow up in regards to a MRI

## 2018-03-22 MED ORDER — TRAMADOL HCL 50 MG PO TABS: 50.0000 mg | ORAL_TABLET | Freq: Four times a day (QID) | ORAL | 0 refills | Status: DC | PRN

## 2018-03-22 NOTE — Telephone Encounter (Signed)
Patient has not been contacted for the MRI placed at [facility] and would like a call back from [facility] to get their contact info and discuss treatment for his knee pain. Patient says he sent mychart message to [facility] on 03/19/2018 and has not heard back.

## 2018-03-22 NOTE — Telephone Encounter (Signed)
Spoke with patient regarding knee pain and MRI. I will send him a note via MyChart with contact info for [facility]. Pt states that he is still in a lot of pain, he would like to know if something can be sent in to help with him pain until he is able to get the MRI and come back to review the results. Will forward to Dr. Paulla Fore to advise. He says that he is unable to take IBU or Tylenol. He has been icing his knee.

## 2018-03-22 NOTE — Telephone Encounter (Signed)
See note

## 2018-03-23 NOTE — Telephone Encounter (Signed)
Spoke with pt and he is aware that Dr. Yong Channel has sent in rx for Tramadol. He will f/u with Dr. Paulla Fore after MRI.

## 2018-03-26 ENCOUNTER — Encounter: Payer: Self-pay | Admitting: Family Medicine

## 2018-03-26 ENCOUNTER — Other Ambulatory Visit: Payer: 59

## 2018-03-28 ENCOUNTER — Encounter: Payer: Self-pay | Admitting: Family Medicine

## 2018-03-29 ENCOUNTER — Other Ambulatory Visit: Payer: Self-pay

## 2018-03-29 MED ORDER — TRAMADOL HCL 50 MG PO TABS: 50.0000 mg | ORAL_TABLET | Freq: Four times a day (QID) | ORAL | 0 refills | Status: DC | PRN

## 2018-03-29 NOTE — Telephone Encounter (Signed)
Spoke with pt on 03/28/18. He advised that when he tried Tramadol in the past he had gotten minimal relief. I asked him if he thought he would benefit from taking a few days off of work until he could follow-up on his MRI and he advised me that he no longer wanted to see Dr. Paulla Fore, he was going to find another office. I asked him, "may I ask why?" He responded that he didn't feel that he felt there was a lack of communication from our office. I told him that if there was anything that we could do to correct this I'd be glad to know. He advised that he was canceling his MRI and would be finding a new provider.   Lea has been made aware as of 03/29/18.

## 2018-03-30 ENCOUNTER — Other Ambulatory Visit: Payer: 59

## 2018-03-30 NOTE — Telephone Encounter (Signed)
Copied from Glenn [name] [id number]. Topic: General - Other >> Mar 29, 2018  1:37 PM Mcneil, [name] wrote: Reason for CRM: Pt states he would like to remain a patient of Dr Paulla Fore and he would like to have the MRI scheduled. Cb# [phone]

## 2018-03-31 ENCOUNTER — Ambulatory Visit
Admission: RE | Admit: 2018-03-31 | Discharge: 2018-03-31 | Disposition: A | Discharge status: Home / Self Care | Payer: 59 | Source: Ambulatory Visit | Attending: Sports Medicine | Admitting: Sports Medicine

## 2018-03-31 DIAGNOSIS — R600 Localized edema: Secondary | ICD-10-CM | POA: Diagnosis not present

## 2018-03-31 DIAGNOSIS — M25561 Pain in right knee: Secondary | ICD-10-CM

## 2018-04-03 ENCOUNTER — Other Ambulatory Visit: Payer: Self-pay | Admitting: Sports Medicine

## 2018-04-03 ENCOUNTER — Ambulatory Visit: Payer: Self-pay

## 2018-04-03 ENCOUNTER — Other Ambulatory Visit: Payer: Self-pay | Admitting: Family Medicine

## 2018-04-03 ENCOUNTER — Encounter: Payer: Self-pay | Admitting: Sports Medicine

## 2018-04-03 ENCOUNTER — Ambulatory Visit: Payer: 59 | Admitting: Sports Medicine

## 2018-04-03 VITALS — BP 138/100 | HR 94 | Ht 68.0 in | Wt 209.8 lb

## 2018-04-03 DIAGNOSIS — M25561 Pain in right knee: Secondary | ICD-10-CM

## 2018-04-03 NOTE — Telephone Encounter (Signed)
Tramadol refill Last Refill:03/29/18 #30 by Dr. Paulla Fore Last OV: 04/03/18 with Dr. Paulla Fore PCP: Dr. Yong Channel Pharmacy:[facility]   [address]

## 2018-04-03 NOTE — Progress Notes (Deleted)
Error

## 2018-04-03 NOTE — Progress Notes (Deleted)
PROCEDURE NOTE:  Ultrasound Guided: Aspiration and Injection: Right knee, Bakers cyst Images were obtained and interpreted by myself, Teresa Coombs, DO  Images have been saved and stored to PACS system. Images obtained on: GE S7 Ultrasound machine    ULTRASOUND FINDINGS:  { }  DESCRIPTION OF PROCEDURE:  The patient?s clinical condition is marked by substantial pain and/or significant functional disability. Other conservative therapy has not provided relief, is contraindicated, or not appropriate. There is a reasonable likelihood that injection will significantly improve the patient?s pain and/or functional impairment.   After discussing the risks, benefits and expected outcomes of the injection and all questions were reviewed and answered, the patient wished to undergo the above named procedure.  Verbal consent was obtained.  The ultrasound was used to identify the target structure and adjacent neurovascular structures. The skin was then prepped in sterile fashion and the target structure was injected under direct visualization using sterile technique as below:  Single injection performed as below: PREP: Alcohol, Ethel Chloride and 5 cc 1% lidocaine on 25g 1.5 in. needle APPROACH:posterior direct, stopcock technique, 18g 1.5 in. INJECTATE: 2 cc 0.5% Marcaine and 2 cc 40mg /mL DepoMedrol ASPIRATE: *** DRESSING: Band-Aid and 6-inch Ace Wrap  Post procedural instructions including recommending icing and warning signs for infection were reviewed.    This procedure was well tolerated and there were no complications.   IMPRESSION: Succesful Ultrasound Guided: Aspiration and Injection

## 2018-04-03 NOTE — Progress Notes (Signed)
Glenn [name]. Glenn [name], [place] at [place] - 37 y.o. male MRN [id number]  Date of birth: 1980/09/13  Visit Date: 04/03/2018  PCP: Glenn Olp, MD   Referred by: Glenn Olp, MD  Scribe(s) for today's visit: Glenn [name], CMA  SUBJECTIVE:  Glenn [name] is here for Follow-up (R knee)   03/06/2018: His R knee pain symptoms INITIALLY: Began about 3 weeks ago in the ED and MOI is unknown.  Described as moderate (7/10) aching and throbbing, sharp/stabbing, nonradiating Worsened with lifting, bending, weight bearing Improved with rest and elevation.  Additional associated symptoms include: He feels the pain around the the whole knee joint but has pain with palpation just above the patella. There is redness and swelling around the joint. He denies clicking or popping. Per Dr. Yong [name], there is a "cracking" sound when pushing on. the bursa.   At this time symptoms are worsening compared to onset. He has been resting and elevating with knee with minimal relief. He has tried using IcyHot with no relief. He has been wearing compression brace around the knee.   No recent Xray of the R knee  03/16/2018: Compared to the last office visit, his previously described symptoms show no change. In the morning the pain is not "that bad" but flares up throughout the day.  Current symptoms are moderate & are radiating to the distal aspect of the knee. Initially the pain was on the proximal aspect of the knee.  He was prescribed Colchicine and Medrol Dosepak at his last visit. He feels that when he was on the medrol dosepak he was getting some relief. He has noticed occasional swelling around his knee when working. He also reports erythema around the knee.  He has been icing his knee and wearing a compression sleeve with some relief.  XR R knee 03/06/2018  04/03/2018: Compared to the last office visit, his previously described  symptoms are worsening, he twisted his knee about 1-2 weeks ago (prior to obtaining the MRI is reviewed below) and he now has radiating pain into the R calf. He reports that pain is worse when he tried to bear weight on his R heel, he has been walking on his toes to compensate. He reports mild swelling around the knee and occasional popping, popping has been present since childhood.  Current symptoms are moderate & are radiating to the R calf. Pain in the R calf is constant and severe. He has been taking Tramadol 50 mg q6h for pain. The pain wakes him from sleep and he will have to take another Tramadol to get back to sleep d/t pain. He has been icing his knee prn with temporary relief. He has been wearing knee brace, not compression (since twisting the knee).   REVIEW OF SYSTEMS: Reports night time disturbances. Denies fevers, chills, or night sweats. Denies unexplained weight loss. Denies personal history of cancer. Denies changes in bowel or bladder habits. Denies recent unreported falls. Denies new or worsening dyspnea or wheezing. Denies headaches or dizziness.  Reports numbness, tingling in R foot at times.  Denies dizziness or presyncopal episodes Reports lower extremity edema    HISTORY:  Prior history reviewed and updated per electronic medical record.  Social History   Occupational History  . Not on file  Tobacco Use  . Smoking status: Former Smoker    Packs/day: 1.50    Years: 12.00    Pack years:  18.00    Types: Cigarettes    Last attempt to quit: 08/30/2011    Years since quitting: 6.7  . Smokeless tobacco: Current User    Types: Chew  Substance and Sexual Activity  . Alcohol use: Yes    Alcohol/week: 32.0 standard drinks    Types: 24 Cans of beer, 8 Shots of liquor per week  . Drug use: Yes    Types: Ketamine, Amphetamines, Codeine  . Sexual activity: Not Currently   Social History   Social History Narrative   Single. LIves with dad and brother (father Glenn [name] patient of Dr. Yong [name]). 1 dog.    Heavy beer and alcohol drinker least 8 beers daily and goes through a half a gallon of liquor a week.   Works in Chief Strategy Officer- multiple jobs      Hobbies: "just works", [employer] 5 days a week    DATA OBTAINED & REVIEWED:   Recent Labs    03/06/18 1513  LABURIC 7.7    Initial MSK ultrasound performed on 03/06/2018 and x-rays were fairly inconspicuous with possible small lateral quadricep strain.  Subsequent injury sustained stepping off a curb that resulted in the MRI as below  MRI of the right knee on 03/31/2018 showed nondisplaced trabecular fracture of the fibular head with likely ruptured Baker's cyst/bony contusion .   OBJECTIVE:  VS:  HT:5\' 8"  (172.7 cm)   WT:209 lb 12.8 oz (95.2 kg)  BMI:31.91    BP:(!) 138/100  HR:94bpm  TEMP: ( )  RESP:98 %   PHYSICAL EXAM: CONSTITUTIONAL: Well-developed, Well-nourished and In no acute distress PSYCHIATRIC: Alert & appropriately interactive. and Not depressed or anxious appearing. RESPIRATORY: No increased work of breathing and Trachea Midline EYES: Pupils are equal., EOM intact without nystagmus. and No scleral icterus.  VASCULAR EXAM: Warm and well perfused NEURO: unremarkable  MSK Exam: Right knee  Well aligned, no significant deformity. No overlying skin changes. TTP over Generalized TTP over the lateral knee.  Less sore over the quadriceps tendon.  No significant effusion today.   RANGE OF MOTION & STRENGTH  Marked pain with flexion extension especially with weightbearing.   SPECIALITY TESTING:  Significant pain with varus testing however no ligamentous laxity.  Negative McMurray's all the pain localizing to the lateral aspect of the knee.  Tremulous with straight leg raise.    ASSESSMENT   1. Acute pain of right knee     PLAN:  Pertinent additional documentation may be included in corresponding procedure notes, imaging studies, problem based  documentation and patient instructions.  Procedures:  . None   Medications:  No orders of the defined types were placed in this encounter.  Discussion/Instructions: No problem-specific Assessment & Plan notes found for this encounter.  . Long discussion today he likely because of recurrent injury as he stepped off a curb several weeks ago.  This is what likely resulted in the fibular head fracture.  We will place in a knee immobilizer and have him minimize his weightbearing. . Brace/supportive device provided per AVS. Knee immobilizer, crutches offered . Work / Youth worker note with recommendations provided.  Out of work x6 weeks . Discussed red flag symptoms that warrant earlier emergent evaluation and patient voices understanding. . Activity modifications and the importance of avoiding exacerbating activities (limiting pain to no more than a 4 / 10 during or following activity) recommended and discussed. Marland Kitchen RICE (Rest, ICE, Compression, Elevation) principles reviewed with the patient. . >50% of this 25 minutes  minute visit spent in direct patient counseling and/or coordination of care. Discussion was focused on education regarding the in discussing the pathoetiology and anticipated clinical course of the above condition.  Follow-up:  . Return in about 2 weeks (around 04/17/2018).   . At follow up will plan to consider: Obtain follow-up x-rays of the knee     CMA/ATC served as scribe during this visit. History, Physical, and Plan performed by medical provider. Documentation and orders reviewed and attested to.      Gerda Diss, [facility] Physician

## 2018-04-03 NOTE — Telephone Encounter (Signed)
Copied from [place] [id number]. Topic: Quick Communication - Rx Refill/Question >> Apr 03, 2018  6:18 PM Neva Seat wrote: traMADol (ULTRAM) 50 MG tablet  Needing refilll on medication.  Pt came to visit and it wasn't ready for pickup after visit.  [facility] [place], [place] AT [facility] OF [address] & [address] [place] [address] Phone: [phone] Fax: [phone]

## 2018-04-11 MED ORDER — TRAMADOL HCL 50 MG PO TABS: ORAL_TABLET | ORAL | 0 refills | Status: DC

## 2018-04-17 ENCOUNTER — Ambulatory Visit: Payer: 59 | Admitting: Sports Medicine

## 2018-04-19 ENCOUNTER — Ambulatory Visit: Payer: 59 | Admitting: Sports Medicine

## 2018-04-19 ENCOUNTER — Encounter: Payer: Self-pay | Admitting: Sports Medicine

## 2018-04-20 MED ORDER — TRAMADOL HCL 50 MG PO TABS: ORAL_TABLET | ORAL | 0 refills | Status: DC

## 2018-04-20 NOTE — Telephone Encounter (Signed)
Looks like pt has OV scheduled with Dr. Paulla Fore 04/23/18.

## 2018-04-23 ENCOUNTER — Ambulatory Visit: Payer: 59 | Admitting: Sports Medicine

## 2018-04-23 ENCOUNTER — Encounter: Payer: Self-pay | Admitting: Sports Medicine

## 2018-04-23 VITALS — BP 144/100 | HR 91 | Ht 68.0 in | Wt 213.8 lb

## 2018-04-23 DIAGNOSIS — M25561 Pain in right knee: Secondary | ICD-10-CM

## 2018-04-23 DIAGNOSIS — S82144D Nondisplaced bicondylar fracture of right tibia, subsequent encounter for closed fracture with routine healing: Secondary | ICD-10-CM | POA: Diagnosis not present

## 2018-04-23 DIAGNOSIS — S8001XD Contusion of right knee, subsequent encounter: Secondary | ICD-10-CM

## 2018-04-23 DIAGNOSIS — S8001XA Contusion of right knee, initial encounter: Secondary | ICD-10-CM | POA: Insufficient documentation

## 2018-04-23 NOTE — Progress Notes (Signed)
Glenn [name]. Glenn [name], [place] at [place] - 37 y.o. male MRN [id number]  Date of birth: [birth year]  Visit Date: 04/23/2018  PCP: Marin Olp, MD   Referred by: Marin Olp, MD  Scribe(s) for today's visit: Josepha Pigg, CMA  SUBJECTIVE:  Glenn [name] is here for Follow-up (R knee pain)   03/06/2018: His R knee pain symptoms INITIALLY: Began about 3 weeks ago in the ED and MOI is unknown.  Described as moderate (7/10) aching and throbbing, sharp/stabbing, nonradiating Worsened with lifting, bending, weight bearing Improved with rest and elevation.  Additional associated symptoms include: He feels the pain around the the whole knee joint but has pain with palpation just above the patella. There is redness and swelling around the joint. He denies clicking or popping. Per Dr. Yong Channel, there is a "cracking" sound when pushing on. the bursa.   At this time symptoms are worsening compared to onset. He has been resting and elevating with knee with minimal relief. He has tried using IcyHot with no relief. He has been wearing compression brace around the knee.   No recent Xray of the R knee  03/16/2018: Compared to the last office visit, his previously described symptoms show no change. In the morning the pain is not "that bad" but flares up throughout the day.  Current symptoms are moderate & are radiating to the distal aspect of the knee. Initially the pain was on the proximal aspect of the knee.  He was prescribed Colchicine and Medrol Dosepak at his last visit. He feels that when he was on the medrol dosepak he was getting some relief. He has noticed occasional swelling around his knee when working. He also reports erythema around the knee.  He has been icing his knee and wearing a compression sleeve with some relief.  XR R knee 03/06/2018  04/03/2018: Compared to the last office visit, his previously described  symptoms are worsening, he twisted his knee about 1-2 weeks ago and he now has radiating pain into the R calf. He reports that pain is worse when he tried to bear weight on his R heel, he has been walking on his toes to compensate. He reports mild swelling around the knee and occasional popping, popping has been present since childhood.  Current symptoms are moderate & are radiating to the R calf. Pain in the R calf is constant and severe. He has been taking Tramadol 50 mg q6h for pain. The pain wakes him from sleep and he will have to take another Tramadol to get back to sleep d/t pain. He has been icing his knee prn with temporary relief. He has been wearing knee brace, not compression (since twisting the knee).   04/23/2018: Compared to the last office visit, his previously described symptoms are improving. Current symptoms are moderate & are non-radiating. He reports less swelling than previously noted.  He has been taking Tramadol prn with some relief. He has been icing his knee prn. He is wearing knee immobilizer almost 24/7. He has been ambulating with crutches.    REVIEW OF SYSTEMS: Reports night time disturbances. Denies fevers, chills, or night sweats. Denies unexplained weight loss. Denies personal history of cancer. Denies changes in bowel or bladder habits. Denies recent unreported falls. Denies new or worsening dyspnea or wheezing. Denies headaches or dizziness.  Reports numbness, tingling or weakness RLE Denies dizziness or presyncopal episodes Reports lower extremity edema  HISTORY & PERTINENT PRIOR DATA:  Significant/pertinent history, findings, studies include:  reports that he quit smoking about 6 years ago. His smoking use included cigarettes. He has a 18.00 pack-year smoking history. His smokeless tobacco use includes chew. Recent Labs    03/06/18 1513  LABURIC 7.7   No specialty comments available. Problem  Contusion of Right Knee    Otherwise prior  history reviewed and updated per electronic medical record.    OBJECTIVE:  VS:  HT:5\' 8"  (172.7 cm)   WT:213 lb 12.8 oz (97 kg)  BMI:32.52    BP:(Abnormal) 144/100  HR:91bpm  TEMP: ( )  RESP:97 %   PHYSICAL EXAM: CONSTITUTIONAL: Well-developed, Well-nourished and In no acute distress Alert & appropriately interactive. and Not depressed or anxious appearing. RESPIRATORY: No increased work of breathing and Trachea Midline EYES: Pupils are equal., EOM intact without nystagmus. and No scleral icterus.  Lower extremities: Warm and well perfused NEURO: unremarkable  MSK Exam: Right Knee: Marland Kitchen Well aligned, no significant deformity. . No overlying skin changes. . Generalized tenderness across the entire knee.  He has focal pain with palpation along the medial and lateral tibial plateaus and joint line.  No significant effusion. Marland Kitchen Extensor mechanism involved.  No significant pain with knee extension although limited flexion to 115 degrees with mild pain. . Ligamentously stable to Anterior and posterior drawer, varus and valgus straining. Marland Kitchen    PROCEDURES & DATA REVIEWED:  . None  ASSESSMENT   1. Contusion of right knee, subsequent encounter   2. Closed nondisplaced fracture of right tibial plateau with routine healing, subsequent encounter   3. Acute pain of right knee     PLAN:   See AVS Rest the injured area as much as practical Apply ice packs    . Continue with an additional 2 weeks of minimal weightbearing. . Tramadol has been refilled by Dr. Yong Channel .  No problem-specific Assessment & Plan notes found for this encounter.   Follow-up: Return in about 2 weeks (around 05/07/2018) for repeat X-rays, repeat clinical exam.      Please see additional documentation for Objective, Assessment and Plan sections. Pertinent additional documentation may be included in corresponding procedure notes, imaging studies, problem based documentation and patient instructions. Please see  these sections of the encounter for additional information regarding this visit.  CMA/ATC served as Education administrator during this visit. History, Physical, and Plan performed by medical provider. Documentation and orders reviewed and attested to.      Gerda Diss, [facility] Physician

## 2018-04-23 NOTE — Patient Instructions (Addendum)
Keep wearing the knee immobilizer when you are up. I am okay with you not wearing it at nighttime and beginning to work on gently bending your knee.  Try cutting back on your Tramadol.  You can continue to use ice as much as possible.  I am hoping that we can get you back to work in 2 weeks but may need to have you do some physical therapy.  We will get new x-rays when you follow-up at your next visit.

## 2018-04-24 ENCOUNTER — Encounter: Payer: Self-pay | Admitting: Sports Medicine

## 2018-05-06 DIAGNOSIS — T148XXA Other injury of unspecified body region, initial encounter: Secondary | ICD-10-CM | POA: Diagnosis not present

## 2018-05-07 ENCOUNTER — Encounter: Payer: Self-pay | Admitting: Sports Medicine

## 2018-05-07 ENCOUNTER — Ambulatory Visit: Payer: 59 | Admitting: Sports Medicine

## 2018-05-07 ENCOUNTER — Ambulatory Visit (INDEPENDENT_AMBULATORY_CARE_PROVIDER_SITE_OTHER): Payer: 59

## 2018-05-07 VITALS — BP 160/94 | HR 102 | Ht 68.0 in

## 2018-05-07 DIAGNOSIS — M25561 Pain in right knee: Secondary | ICD-10-CM

## 2018-05-07 DIAGNOSIS — S82144D Nondisplaced bicondylar fracture of right tibia, subsequent encounter for closed fracture with routine healing: Secondary | ICD-10-CM

## 2018-05-07 NOTE — Progress Notes (Signed)
Glenn [name]. [place], [place] at [place] - 37 y.o. male MRN [id number]  Date of birth: August 20, 1981  Visit Date: 05/07/2018  PCP: Marin Olp, MD   Referred by: Marin Olp, MD  Scribe(s) for today's visit: Josepha Pigg, CMA  SUBJECTIVE:  Glenn [name] is here for No chief complaint on file.  03/06/2018: His R knee pain symptoms INITIALLY: Began about 3 weeks ago in the ED and MOI is unknown.  Described as moderate (7/10) aching and throbbing, sharp/stabbing, nonradiating Worsened with lifting, bending, weight bearing Improved with rest and elevation.  Additional associated symptoms include: He feels the pain around the the whole knee joint but has pain with palpation just above the patella. There is redness and swelling around the joint. He denies clicking or popping. Per Dr. Yong Channel, there is a "cracking" sound when pushing on. the bursa.   At this time symptoms are worsening compared to onset. He has been resting and elevating with knee with minimal relief. He has tried using IcyHot with no relief. He has been wearing compression brace around the knee.   No recent Xray of the R knee  03/16/2018: Compared to the last office visit, his previously described symptoms show no change. In the morning the pain is not "that bad" but flares up throughout the day.  Current symptoms are moderate & are radiating to the distal aspect of the knee. Initially the pain was on the proximal aspect of the knee.  He was prescribed Colchicine and Medrol Dosepak at his last visit. He feels that when he was on the medrol dosepak he was getting some relief. He has noticed occasional swelling around his knee when working. He also reports erythema around the knee.  He has been icing his knee and wearing a compression sleeve with some relief.  XR R knee 03/06/2018  04/03/2018: Compared to the last office visit, his previously  described symptoms are worsening, he twisted his knee about 1-2 weeks ago and he now has radiating pain into the R calf. He reports that pain is worse when he tried to bear weight on his R heel, he has been walking on his toes to compensate. He reports mild swelling around the knee and occasional popping, popping has been present since childhood.  Current symptoms are moderate & are radiating to the R calf. Pain in the R calf is constant and severe. He has been taking Tramadol 50 mg q6h for pain. The pain wakes him from sleep and he will have to take another Tramadol to get back to sleep d/t pain. He has been icing his knee prn with temporary relief. He has been wearing knee brace, not compression (since twisting the knee).   04/23/2018: Compared to the last office visit, his previously described symptoms are improving. Current symptoms are moderate & are non-radiating. He reports less swelling than previously noted.  He has been taking Tramadol prn with some relief. He has been icing his knee prn. He is wearing knee immobilizer almost 24/7. He has been ambulating with crutches.   05/07/2018: Compared to the last office visit, his previously described symptoms are improving. Current symptoms are mild & are nonradiating He has been non-weightbearing. He has stopped taking Tramadol. He has been ambulating with crutches and wearing immobilizer.  REVIEW OF SYSTEMS: Reports night time disturbances. Denies fevers, chills, or night sweats. Denies unexplained weight loss. Denies personal history of cancer.  Denies changes in bowel or bladder habits. Denies recent unreported falls. Denies new or worsening dyspnea or wheezing. Denies headaches or dizziness.  Reports numbness, tingling or weakness  In the extremities.  Denies dizziness or presyncopal episodes Denies lower extremity edema     HISTORY:  Prior history reviewed and updated per electronic medical record.  Social History   Occupational  History  . Not on file  Tobacco Use  . Smoking status: Former Smoker    Packs/day: 1.50    Years: 12.00    Pack years: 18.00    Types: Cigarettes    Last attempt to quit: 08/30/2011    Years since quitting: 6.7  . Smokeless tobacco: Current User    Types: Chew  Substance and Sexual Activity  . Alcohol use: Yes    Alcohol/week: 32.0 standard drinks    Types: 24 Cans of beer, 8 Shots of liquor per week  . Drug use: Yes    Types: Ketamine, Amphetamines, Codeine  . Sexual activity: Not Currently   Social History   Social History Narrative   Single. LIves with dad and brother (father Ona Rathert patient of Dr. Yong Channel). 1 dog.    Heavy beer and alcohol drinker least 8 beers daily and goes through a half a gallon of liquor a week.   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  Subsequent injury sustained stepping off a curb that resulted in the MRI as below MRI of the right knee on 03/31/2018 showed nondisplaced trabecular fracture of the fibular head with likely ruptured Baker's cyst/bony contusion Follow-up x-ray on May 07, 2018 reassuring for good healing.   OBJECTIVE:  VS:  HT:5\' 8"  (172.7 cm)   WT:   BMI:     BP:(!) 160/94  HR:(!) 102bpm  TEMP: ( )  RESP:95 %   PHYSICAL EXAM: CONSTITUTIONAL: Well-developed, Well-nourished and In no acute distress PSYCHIATRIC: Alert & appropriately interactive. and Not depressed or anxious appearing. RESPIRATORY: No increased work of breathing and Trachea Midline EYES: Pupils are equal., EOM intact without nystagmus. and No scleral icterus.  VASCULAR EXAM: Warm and well perfused NEURO: unremarkable  MSK Exam: Right knee  Well aligned, no significant  deformity. No overlying skin changes. TTP over Small amount of persistent TTP over the medial and lateral joint lines.   RANGE OF MOTION & STRENGTH  Persistent weakness of 4 out of 5 with knee extension however significantly improved with less fasciculations. Overall range of motion is significantly better.   SPECIALITY TESTING:  Ligamentously stable to varus and valgus straining Positive McMurray's but no appreciable clicking.  He is able to bear weight however Thessaly testing is deferred.       ASSESSMENT   1. Closed nondisplaced fracture of right tibial plateau with routine healing, subsequent encounter   2. Acute pain of right knee     PLAN:  Pertinent additional documentation may be included in corresponding procedure notes, imaging studies, problem based documentation and patient instructions.  Procedures:  . None  Medications:  No orders of the defined types were placed in this encounter.  Discussion/Instructions: . Begin increasing weightbearing as tolerated. . Still to be cautious with full flexion and extension and  recommend additional 2 weeks of modified activity.  Continue working on straight leg raises and strengthening.  Faythe Ghee to wean out of the knee brace as tolerated.  Follow-up:  . Return in about 2 weeks (around 05/21/2018).  . Will consider referral to physical therapy and/or advancement of home therapeutic exercises.     CMA/ATC served as Education administrator during this visit. History, Physical, and Plan performed by medical provider. Documentation and orders reviewed and attested to.      Gerda Diss, [facility] Physician

## 2018-05-12 ENCOUNTER — Encounter: Payer: Self-pay | Admitting: Sports Medicine

## 2018-05-14 ENCOUNTER — Encounter: Payer: Self-pay | Admitting: Sports Medicine

## 2018-05-14 DIAGNOSIS — M25561 Pain in right knee: Secondary | ICD-10-CM | POA: Insufficient documentation

## 2018-05-18 ENCOUNTER — Ambulatory Visit: Payer: 59 | Admitting: Sports Medicine

## 2018-05-18 ENCOUNTER — Encounter: Payer: Self-pay | Admitting: Sports Medicine

## 2018-05-18 VITALS — BP 118/88 | HR 95 | Ht 68.0 in | Wt 208.0 lb

## 2018-05-18 DIAGNOSIS — S82144D Nondisplaced bicondylar fracture of right tibia, subsequent encounter for closed fracture with routine healing: Secondary | ICD-10-CM | POA: Diagnosis not present

## 2018-05-18 NOTE — Progress Notes (Signed)
Glenn [name]. Glenn [name], [facility] - 37 y.o. male MRN [id number]  Date of birth: 01-30-1981  Visit Date: 05/18/2018  PCP: Glenn Olp, MD   Referred by: Glenn Olp, MD  Scribe(s) for today's visit: Glenn [name], CMA  SUBJECTIVE:  Glenn [name] is here for Follow-up (R knee pain)  03/06/2018: His R knee pain symptoms INITIALLY: Began about 3 weeks ago in the ED and MOI is unknown.  Described as moderate (7/10) aching and throbbing, sharp/stabbing, nonradiating Worsened with lifting, bending, weight bearing Improved with rest and elevation.  Additional associated symptoms include: He feels the pain around the the whole knee joint but has pain with palpation just above the patella. There is redness and swelling around the joint. He denies clicking or popping. Per Dr. Yong Channel, there is a "cracking" sound when pushing on. the bursa.   At this time symptoms are worsening compared to onset. He has been resting and elevating with knee with minimal relief. He has tried using IcyHot with no relief. He has been wearing compression brace around the knee.   No recent Xray of the R knee  03/16/2018: Compared to the last office visit, his previously described symptoms show no change. In the morning the pain is not "that bad" but flares up throughout the day.  Current symptoms are moderate & are radiating to the distal aspect of the knee. Initially the pain was on the proximal aspect of the knee.  He was prescribed Colchicine and Medrol Dosepak at his last visit. He feels that when he was on the medrol dosepak he was getting some relief. He has noticed occasional swelling around his knee when working. He also reports erythema around the knee.  He has been icing his knee and wearing a compression sleeve with some relief.  XR R knee 03/06/2018  04/03/2018: Compared to the last office visit, his previously described  symptoms are worsening, he twisted his knee about 1-2 weeks ago and he now has radiating pain into the R calf. He reports that pain is worse when he tried to bear weight on his R heel, he has been walking on his toes to compensate. He reports mild swelling around the knee and occasional popping, popping has been present since childhood.  Current symptoms are moderate & are radiating to the R calf. Pain in the R calf is constant and severe. He has been taking Tramadol 50 mg q6h for pain. The pain wakes him from sleep and he will have to take another Tramadol to get back to sleep d/t pain. He has been icing his knee prn with temporary relief. He has been wearing knee brace, not compression (since twisting the knee).   04/23/2018: Compared to the last office visit, his previously described symptoms are improving. Current symptoms are moderate & are non-radiating. He reports less swelling than previously noted.  He has been taking Tramadol prn with some relief. He has been icing his knee prn. He is wearing knee immobilizer almost 24/7. He has been ambulating with crutches.   05/07/2018: Compared to the last office visit, his previously described symptoms are improving. Current symptoms are mild & are nonradiating He has been non-weightbearing. He has stopped taking Tramadol. He has been ambulating with crutches and wearing immobilizer.  05/18/2018: Compared to the last office visit on 05/07/18, his previously described R knee pain symptoms are improving  Current symptoms are mild &  are nonradiating He has been WBAT and has not been wearing his knee immobilizer.  REVIEW OF SYSTEMS: Reports night time disturbances. Denies fevers, chills, or night sweats. Denies unexplained weight loss. Denies personal history of cancer. Denies changes in bowel or bladder habits. Denies recent unreported falls. Denies new or worsening dyspnea or wheezing. Denies headaches or dizziness.  Reports numbness, tingling or  weakness  In the extremities.  Denies dizziness or presyncopal episodes Denies lower extremity edema     HISTORY:  Prior history reviewed and updated per electronic medical record.  Social History   Occupational History  . Not on file  Tobacco Use  . Smoking status: Former Smoker    Packs/day: 1.50    Years: 12.00    Pack years: 18.00    Types: Cigarettes    Last attempt to quit: 08/30/2011    Years since quitting: 6.7  . Smokeless tobacco: Current User    Types: Chew  Substance and Sexual Activity  . Alcohol use: Yes    Alcohol/week: 32.0 standard drinks    Types: 24 Cans of beer, 8 Shots of liquor per week  . Drug use: Yes    Types: Ketamine, Amphetamines, Codeine  . Sexual activity: Not Currently   Social History   Social History Narrative   Single. LIves with dad and brother (father Glenn [name] patient of Dr. Yong Channel). 1 dog.    Heavy beer and alcohol drinker least 8 beers daily and goes through a half a gallon of liquor a week.   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  Subsequent injury sustained stepping off a curb that resulted in the MRI as below MRI of the right knee on 03/31/2018 showed nondisplaced trabecular fracture of the fibular head with likely ruptured Baker's cyst/bony contusion Follow-up x-ray on May 07, 2018 reassuring for good healing.  OBJECTIVE:  VS:  HT:5\' 8"  (172.7 cm)   WT:208 lb (94.3 kg)  BMI:31.63    BP:118/88  HR:95bpm  TEMP: ( )  RESP:95 %   PHYSICAL EXAM: CONSTITUTIONAL: Well-developed, Well-nourished and In no acute distress PSYCHIATRIC: Alert & appropriately interactive. and Not depressed or anxious appearing. RESPIRATORY: No increased work of  breathing and Trachea Midline EYES: Pupils are equal., EOM intact without nystagmus. and No scleral icterus.  VASCULAR EXAM: Warm and well perfused NEURO: unremarkable  MSK Exam: Right knee  Well aligned, no significant deformity. No overlying skin changes. No focal bony tenderness   RANGE OF MOTION & STRENGTH  Extensor mechanism strength intact without significant pain or discomfort.  He walks with a slightly bent knee gait but his lower extremity strength is good. Overall range of motion is significantly better.   SPECIALITY TESTING:  Stable to varus and valgus strain.  No significant pain with McMurray's.       ASSESSMENT   1. Closed nondisplaced fracture of right tibial plateau with routine healing, subsequent encounter     PLAN:  Pertinent additional documentation may be included in corresponding procedure notes, imaging studies, problem based documentation and patient instructions.  Procedures:  . None  Medications:  No orders of the defined types were placed in this encounter.  Discussion/Instructions: . He is doing  significantly better.  Referral placed for physical therapy.  Okay to return to work in 2 weeks without restrictions.  Limited/light duty until then.   . Doing exceptionally well. . Referral placed Physical therapy  Follow-up:  . Return in about 6 weeks (around 06/29/2018) for repeat X-rays.      CMA/ATC served as Education administrator during this visit. History, Physical, and Plan performed by medical provider. Documentation and orders reviewed and attested to.      Gerda Diss, [place] [facility] Physician

## 2018-06-04 ENCOUNTER — Other Ambulatory Visit: Payer: Self-pay | Admitting: Family Medicine

## 2018-06-04 ENCOUNTER — Other Ambulatory Visit: Payer: Self-pay | Admitting: Psychiatry

## 2018-06-13 ENCOUNTER — Encounter: Payer: Self-pay | Admitting: Family Medicine

## 2018-06-13 ENCOUNTER — Other Ambulatory Visit: Payer: Self-pay

## 2018-06-13 MED ORDER — LOSARTAN POTASSIUM 100 MG PO TABS: 100.0000 mg | ORAL_TABLET | Freq: Every day | ORAL | 1 refills | Status: DC

## 2018-06-14 ENCOUNTER — Ambulatory Visit: Payer: 59 | Admitting: Physical Therapy

## 2018-06-21 ENCOUNTER — Encounter: Payer: Self-pay | Admitting: Physical Therapy

## 2018-06-21 ENCOUNTER — Ambulatory Visit: Payer: 59 | Admitting: Physical Therapy

## 2018-06-21 DIAGNOSIS — M25561 Pain in right knee: Secondary | ICD-10-CM

## 2018-06-25 ENCOUNTER — Encounter: Payer: Self-pay | Admitting: Physical Therapy
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
## 2018-06-25 NOTE — Patient Instructions (Signed)
Supine SLR Sidelying Hip Abd Seated LAQ Standing hip 3 way, with Theraband Quad sets All x20 , 1x daily. Discussed safety and machines to perform when returning to gym work outs (at length)

## 2018-06-29 ENCOUNTER — Ambulatory Visit: Payer: Self-pay | Admitting: Sports Medicine

## 2018-07-02 ENCOUNTER — Encounter: Payer: Self-pay | Admitting: Emergency Medicine

## 2018-07-02 DIAGNOSIS — F3341 Major depressive disorder, recurrent, in partial remission: Secondary | ICD-10-CM

## 2018-07-02 DIAGNOSIS — F325 Major depressive disorder, single episode, in full remission: Secondary | ICD-10-CM | POA: Insufficient documentation

## 2018-07-02 DIAGNOSIS — F321 Major depressive disorder, single episode, moderate: Secondary | ICD-10-CM

## 2018-07-02 DIAGNOSIS — F411 Generalized anxiety disorder: Secondary | ICD-10-CM

## 2018-07-02 DIAGNOSIS — F330 Major depressive disorder, recurrent, mild: Secondary | ICD-10-CM | POA: Insufficient documentation

## 2018-07-05 ENCOUNTER — Ambulatory Visit: Payer: 59 | Admitting: Sports Medicine

## 2018-07-12 ENCOUNTER — Other Ambulatory Visit: Payer: Self-pay | Admitting: Psychiatry

## 2018-07-12 NOTE — Telephone Encounter (Signed)
Need to review paper chart  

## 2018-07-19 ENCOUNTER — Ambulatory Visit: Payer: 59 | Admitting: Psychiatry

## 2018-08-02 ENCOUNTER — Ambulatory Visit: Payer: 59 | Admitting: Psychiatry

## 2018-08-17 ENCOUNTER — Other Ambulatory Visit: Payer: Self-pay

## 2018-08-17 MED ORDER — DIAZEPAM 10 MG PO TABS: ORAL_TABLET | ORAL | 0 refills | Status: DC

## 2018-08-28 ENCOUNTER — Ambulatory Visit (INDEPENDENT_AMBULATORY_CARE_PROVIDER_SITE_OTHER): Payer: 59 | Admitting: Psychiatry

## 2018-08-28 ENCOUNTER — Encounter: Payer: Self-pay | Admitting: Psychiatry

## 2018-08-28 VITALS — BP 130/86 | HR 80 | Ht 69.5 in | Wt 215.0 lb

## 2018-08-28 DIAGNOSIS — F1021 Alcohol dependence, in remission: Secondary | ICD-10-CM

## 2018-08-28 DIAGNOSIS — F3341 Major depressive disorder, recurrent, in partial remission: Secondary | ICD-10-CM

## 2018-08-28 DIAGNOSIS — F411 Generalized anxiety disorder: Secondary | ICD-10-CM

## 2018-08-28 DIAGNOSIS — F410 Panic disorder [episodic paroxysmal anxiety] without agoraphobia: Secondary | ICD-10-CM

## 2018-08-28 MED ORDER — NORTRIPTYLINE HCL 10 MG PO CAPS: 10.0000 mg | ORAL_CAPSULE | Freq: Every day | ORAL | 1 refills | Status: DC

## 2018-08-28 MED ORDER — FLUVOXAMINE MALEATE 100 MG PO TABS: 200.0000 mg | ORAL_TABLET | Freq: Every day | ORAL | 1 refills | Status: DC

## 2018-08-28 MED ORDER — DIAZEPAM 10 MG PO TABS: ORAL_TABLET | ORAL | 1 refills | Status: DC

## 2018-08-28 NOTE — Progress Notes (Signed)
[facility]  Patient ID: NDREW [name],  MRN: [id number]  PCP: Marin Olp, MD  Date of Evaluation: 08/28/2018 Time spent:20 minutes  Chief Complaint:  Chief Complaint    Anxiety; Depression; Panic Attack; Alcohol Problem      HISTORY/CURRENT STATUS: Glenn [name] is seen individually face-to-face with consent not collateral for psychiatric interview and exam in 34-month evaluation and management of current major depression, generalized and panic anxiety, and residual alcohol use and tobacco nearly discontinued.  He stopped ziprasidone sensing that it caused shakes, diminished sleep, and increased anxiety, though he continues his fluvoxamine 200 mg nightly and diazepam 10 mg twice daily.  He requests an activating anti-depressant to augment relief of depression declining previously prescribed Butrans or associated Strattera or the Ritalin he has taken in the past that was too activating.  Paternal grandmother now has cancer after father's death.  His dog is now 16 years of age after death of his previous dog.  He concludes medically he is improved requiring no more treatment having no pancreatitis symptoms any longer.  He declines increase in Luvox.  Anxiety  Presents for follow-up visit. Symptoms include decreased concentration, depressed mood, excessive worry, nervous/anxious behavior, panic and suicidal ideas. Patient reports no insomnia, muscle tension or restlessness. Symptoms occur constantly. The severity of symptoms is severe. The quality of sleep is fair. Nighttime awakenings: occasional.   His past medical history is significant for depression. There is no history of suicide attempts. Compliance with medications is 51-75%. Side effects of treatment include GI discomfort.  Depression       The patient presents with depression.  This is a recurrent problem.  The current episode started more than 1 year ago.   The onset quality is sudden.   The problem occurs daily.  The problem  has been gradually worsening since onset.  Associated symptoms include decreased concentration, hopelessness, decreased interest, body aches, sad and suicidal ideas.  Associated symptoms include no helplessness, does not have insomnia, not irritable, no restlessness, no appetite change and no myalgias.     The symptoms are aggravated by family issues, social issues and medication.  Past treatments include other medications and SSRIs - Selective serotonin reuptake inhibitors.  Compliance with treatment is variable.  Past compliance problems include difficulty with treatment plan and medication issues.  Previous treatment provided mild relief.  Risk factors include family history, a change in medication usage/dosage, history of mental illness, substance abuse, stress and major life event.   Past medical history includes recent illness, anxiety, depression and mental health disorder.     Pertinent negatives include no bipolar disorder, no eating disorder, no obsessive-compulsive disorder, no post-traumatic stress disorder, no schizophrenia, no suicide attempts and no head trauma. Alcohol Problem  The patient's primary symptoms include weakness. This is a recurrent problem. The current episode started more than 1 month ago. The problem has been waxing and waning since onset. Suspected agents include alcohol. The treatment provided mild relief. His past medical history is significant for a recent illness.    Individual Medical History/ Review of Systems: Changes? :No   Allergies: Morphine and related  Current Medications:  Current Outpatient Medications:  .  b complex vitamins capsule, Take 1 capsule by mouth daily., Disp: , Rfl:  .  Colchicine (MITIGARE) 0.6 MG CAPS, Take 1 capsule by mouth 2 (two) times daily as needed., Disp: 30 capsule, Rfl: 0 .  diazepam (VALIUM) 10 MG tablet, TAKE 1 TABLET BY MOUTH EVERY MORNING, AND 1  TABLET EVERY DAY AS NEEDED FOR PAIN, Disp: 60 tablet, Rfl: 1 .  fluvoxaMINE (LUVOX)  100 MG tablet, Take 2 tablets (200 mg total) by mouth at bedtime., Disp: 60 tablet, Rfl: 1 .  losartan (COZAAR) 100 MG tablet, Take 1 tablet (100 mg total) by mouth daily., Disp: 90 tablet, Rfl: 1 .  nortriptyline (PAMELOR) 10 MG capsule, Take 1 capsule (10 mg total) by mouth at bedtime., Disp: 30 capsule, Rfl: 1 Medication Side Effects: tremor from [place] Social History: Changes? Yes as paternal grandmother has cancer  MENTAL HEALTH EXAM: Muscle strength 5/5, postural reflexes 0/0, and AIMS equal 0 Blood pressure 130/86, pulse 80, height 5' 9.5" (1.765 m), weight 215 lb (97.5 kg).Body mass index is 31.29 kg/m.  General Appearance: Disheveled, Guarded and Obese  Eye Contact:  Fair  Speech:  Clear and Coherent, Garbled and Talkative  Volume:  Normal  Mood:  Anxious, Depressed, Dysphoric and Hopeless  Affect:  Constricted, Depressed and Anxious  Thought Process:  Goal Directed and Linear  Orientation:  Full (Time, Place, and Person)  Thought Content: Obsessions and Rumination   Suicidal Thoughts:  No  Homicidal Thoughts:  No  Memory:  Immediate;   Fair Remote;   Fair  Judgement:  Fair  Insight:  Fair  Psychomotor Activity:  Normal  Concentration:  Concentration: Fair and Attention Span: Fair  Recall:  Good  Fund of Knowledge: Fair  Language: Fair  Assets:  Desire for Improvement Housing Transportation  ADL's:  Intact  Cognition: WNL  Prognosis:  Fair    DIAGNOSES:    ICD-10-CM   1. Major depressive disorder, recurrent episode, in partial remission with seasonal pattern (HCC) F33.41 fluvoxaMINE (LUVOX) 100 MG tablet    nortriptyline (PAMELOR) 10 MG capsule  2. GAD (generalized anxiety disorder) F41.1 fluvoxaMINE (LUVOX) 100 MG tablet    diazepam (VALIUM) 10 MG tablet    nortriptyline (PAMELOR) 10 MG capsule  3. Panic disorder F41.0 fluvoxaMINE (LUVOX) 100 MG tablet    diazepam (VALIUM) 10 MG tablet    nortriptyline (PAMELOR) 10 MG capsule  4. Alcohol use  disorder, moderate, in early remission ([place]) F10.21     Receiving Psychotherapy: No Previously worked with Rosary Lively, LPC   RECOMMENDATIONS: Can review and structure all options for relief of anxiety and depression without side effects or other medical consequence.  Cessation completely of alcohol and tobacco is emphasized in counseling.  He declines to restart Wellbutrin.  He will start nortriptyline 10 mg capsule every morning E scribed as per 30 with 1 refill to [employer] and [employer].  He is updated on E scribing Luvox 100 mg taking 2 every bedtime #60 and 1 refill and Valium 10 mg twice daily #60 with 1 refill for major depression, panic, and GAD to return in 1 month.   Delight Hoh, MD

## 2018-09-05 ENCOUNTER — Encounter: Payer: Self-pay | Admitting: Family Medicine

## 2018-09-06 ENCOUNTER — Encounter: Payer: Self-pay | Admitting: Family Medicine

## 2018-09-25 ENCOUNTER — Encounter: Payer: Self-pay | Admitting: Psychiatry

## 2018-09-25 ENCOUNTER — Ambulatory Visit (INDEPENDENT_AMBULATORY_CARE_PROVIDER_SITE_OTHER): Payer: 59 | Admitting: Psychiatry

## 2018-09-25 VITALS — BP 110/80 | HR 76 | Ht 69.5 in | Wt 206.0 lb

## 2018-09-25 DIAGNOSIS — F410 Panic disorder [episodic paroxysmal anxiety] without agoraphobia: Secondary | ICD-10-CM | POA: Diagnosis not present

## 2018-09-25 DIAGNOSIS — F321 Major depressive disorder, single episode, moderate: Secondary | ICD-10-CM

## 2018-09-25 DIAGNOSIS — F411 Generalized anxiety disorder: Secondary | ICD-10-CM | POA: Diagnosis not present

## 2018-09-25 MED ORDER — METHYLPHENIDATE HCL 10 MG PO TABS: 10.0000 mg | ORAL_TABLET | Freq: Two times a day (BID) | ORAL | 0 refills | Status: DC

## 2018-09-25 NOTE — Progress Notes (Signed)
[facility]  Patient ID: Glenn [name],  MRN: [id number]  PCP: Marin Olp, MD  Date of Evaluation: 09/25/2018 Time spent:20 minutes  Chief Complaint:  Chief Complaint    Anxiety; Depression; Panic Attack      HISTORY/CURRENT STATUS: Glenn [name] is seen individually face-to-face with consent not collateral for psychiatric interview and exam in 4-week evaluation and management of depression more than anxiety now.  Alcohol use is significantly down though he seems to minimize.  He denies any fear or new symptoms that might suggest pancreatitis would recur.  Patient continues chewing tobacco and reports 2 beers weekly.  He states he is afraid of Ritalin, but he seeks to start it as his depression is progressively consequential in his daily life and nothing else seems to help.  He has interest in regaining his capacity for interest in family or employment.  He is not sure he wants to worry about his health.  He takes Luvox 200 mg nightly and diazepam 10 mg up to twice daily ready to stop nortriptyline 10 mg nightly being off ziprasidone now for 4 months which caused hand tremor, diminished sleep, and anxiety.  Depression       The patient presents with depression.  This is a recurrent problem.  The current episode started more than 1 year ago.   The onset quality is gradual.   The problem occurs intermittently.The problem is unchanged.  Associated symptoms include decreased concentration, fatigue, insomnia, restlessness and appetite change.     The symptoms are aggravated by work stress, medication, family issues and medication withdrawal.  Past treatments include SSRIs - Selective serotonin reuptake inhibitors, SNRIs - Serotonin and norepinephrine reuptake inhibitors and other medications.  Compliance with treatment is variable.  Past compliance problems include difficulty with treatment plan, medication issues, difficulty understanding directions and medical issues.  Previous treatment  provided moderate relief.  Risk factors include intravenous drug abuse, family history of mental illness, a change in medication usage/dosage, a change in medications, history of mental illness, history of self-injury, prior psychiatric admission, major life event, stress and alcohol intake.   Past medical history includes chronic fatigue syndrome, fibromyalgia, anxiety, eating disorder, depression, mental health disorder, post-traumatic stress disorder and schizophrenia.     Pertinent negatives include no bipolar disorder and no head trauma.   Individual Medical History/ Review of Systems: Changes? :Yes Patient is slow to establish perspective and meaning in his associations and decisions.  He asks today if males can have fibromyalgia similar to his mother having such.  He knows that his brother took Ritalin but had insomnia from it, though the patient notes that nortriptyline has not been helpful for his mood, motivation, or focus.  He therefore seeks Ritalin but fears that it will keep him from sleeping at night.  He did lose 9 pounds in the last month 42 beers weekly chewing tobacco with no heavy drinking.  He has [facility] sleep aids in case needed as trazodone quit working last September and he stopped Wellbutrin 1 year before that.  Allergies: Morphine and related  Current Medications:  Current Outpatient Medications:  .  b complex vitamins capsule, Take 1 capsule by mouth daily., Disp: , Rfl:  .  Colchicine (MITIGARE) 0.6 MG CAPS, Take 1 capsule by mouth 2 (two) times daily as needed., Disp: 30 capsule, Rfl: 0 .  diazepam (VALIUM) 10 MG tablet, TAKE 1 TABLET BY MOUTH EVERY MORNING, AND 1 TABLET EVERY DAY AS NEEDED FOR PAIN, Disp: 60 tablet,  Rfl: 1 .  fluvoxaMINE (LUVOX) 100 MG tablet, Take 2 tablets (200 mg total) by mouth at bedtime., Disp: 60 tablet, Rfl: 1 .  losartan (COZAAR) 100 MG tablet, Take 1 tablet (100 mg total) by mouth daily., Disp: 90 tablet, Rfl: 1 .  methylphenidate (RITALIN) 10  MG tablet, Take 1 tablet (10 mg total) by mouth 2 (two) times daily for 30 days., Disp: 60 tablet, Rfl: 0 .  [START ON 10/25/2018] methylphenidate (RITALIN) 10 MG tablet, Take 1 tablet (10 mg total) by mouth 2 (two) times daily for 30 days., Disp: 60 tablet, Rfl: 0   Medication Side Effects: none  Family Medical/ Social History: Changes? Yes as patient gathers experience from the Internet and brother making his own decisions now seeking Ritalin for motivation, focus, and depression.  MENTAL HEALTH EXAM: Muscle strength 5/5, postural reflexes 0/0 and AIMS equals 0. Blood pressure 110/80, pulse 76, height 5' 9.5" (1.765 m), weight 206 lb (93.4 kg).Body mass index is 29.98 kg/m.  General Appearance: Casual, Disheveled, Guarded and Obese  Eye Contact:  Fair  Speech:  Garbled and Slow  Volume:  Normal  Mood:  Anxious, Depressed, Dysphoric, Hopeless and Worthless  Affect:  Depressed, Inappropriate, Restricted and Anxious  Thought Process:  Irrelevant and Linear  Orientation:  Full (Time, Place, and Person)  Thought Content: Ilusions, Obsessions and Rumination   Suicidal Thoughts:  No  Homicidal Thoughts:  No  Memory:  Immediate;   Good Remote;   Fair  Judgement:  Impaired  Insight:  Lacking  Psychomotor Activity:  Decreased and Mannerisms  Concentration:  Concentration: Fair and Attention Span: Fair  Recall:  [employer] of Knowledge: Fair  Language: Fair  Assets:  Leisure Time Talents/Skills Transportation  ADL's:  Intact  Cognition: WNL  Prognosis:  Fair    DIAGNOSES:    ICD-10-CM   1. Moderate major depression (HCC) F32.1 methylphenidate (RITALIN) 10 MG tablet    methylphenidate (RITALIN) 10 MG tablet  2. GAD (generalized anxiety disorder) F41.1   3. Panic disorder F41.0     Receiving Psychotherapy: No    RECOMMENDATIONS: Education is repeated regarding treatment options for ongoing updated understanding of symptoms.  He now clarifies that he identifies with his mother  and her death was more consequential for patient's anxiety  more than mood compared to father's death.  At this time nortriptyline is discontinued low-dose abruptly, and Ritalin is prescribed 10 mg twice daily beginning and midway through his work schedule #60 each for January and February escribed to [facility] on [address] and [place] for depression and fatigue.  He continues Luvox 200 mg nightly current supply and diazepam 10 mg twice daily as needed for anxiety or insomnia off ziprasidone and stopping nortriptyline now.  He must stop alcohol and tobacco as instructed, though he has as needed [facility] sleep aid for initial insomnia, to return in 4 weeks   Delight Hoh, MD

## 2018-10-04 ENCOUNTER — Telehealth: Payer: Self-pay | Admitting: Psychiatry

## 2018-10-04 NOTE — Telephone Encounter (Signed)
Glenn [name] phones the office that he requests and is prepared for therapy in this office after having relied on medication management for his anxiety and depression for years.  Office review for therapy targets for objects of loss in relationships and adapting to the depressing effects of health problems with middle age are best addressed by male as he first experienced death of mother with fibromyalgia now thinking he has fibromyalgia rather than for just fatigue from depression and anxiety, and he has had pancreatitis recently needing to stop alcohol and  tobacco.  The options are phoned to him by reception.

## 2018-10-11 ENCOUNTER — Other Ambulatory Visit: Payer: Self-pay

## 2018-10-11 DIAGNOSIS — F410 Panic disorder [episodic paroxysmal anxiety] without agoraphobia: Secondary | ICD-10-CM

## 2018-10-11 DIAGNOSIS — F411 Generalized anxiety disorder: Secondary | ICD-10-CM

## 2018-10-11 MED ORDER — DIAZEPAM 10 MG PO TABS: ORAL_TABLET | ORAL | 0 refills | Status: DC

## 2018-10-23 ENCOUNTER — Encounter: Payer: Self-pay | Admitting: Psychiatry

## 2018-10-23 ENCOUNTER — Ambulatory Visit (INDEPENDENT_AMBULATORY_CARE_PROVIDER_SITE_OTHER): Payer: 59 | Admitting: Psychiatry

## 2018-10-23 VITALS — BP 120/82 | HR 84 | Ht 68.0 in | Wt 213.0 lb

## 2018-10-23 DIAGNOSIS — F331 Major depressive disorder, recurrent, moderate: Secondary | ICD-10-CM | POA: Diagnosis not present

## 2018-10-23 DIAGNOSIS — F0631 Mood disorder due to known physiological condition with depressive features: Secondary | ICD-10-CM | POA: Diagnosis not present

## 2018-10-23 DIAGNOSIS — R5382 Chronic fatigue, unspecified: Secondary | ICD-10-CM

## 2018-10-23 DIAGNOSIS — F410 Panic disorder [episodic paroxysmal anxiety] without agoraphobia: Secondary | ICD-10-CM | POA: Diagnosis not present

## 2018-10-23 DIAGNOSIS — F411 Generalized anxiety disorder: Secondary | ICD-10-CM | POA: Diagnosis not present

## 2018-10-23 MED ORDER — METHYLPHENIDATE HCL 10 MG PO TABS: 10.0000 mg | ORAL_TABLET | Freq: Three times a day (TID) | ORAL | 0 refills | Status: DC

## 2018-10-23 MED ORDER — FLUVOXAMINE MALEATE 100 MG PO TABS: 300.0000 mg | ORAL_TABLET | Freq: Every day | ORAL | 2 refills | Status: DC

## 2018-10-23 MED ORDER — DIAZEPAM 10 MG PO TABS: 10.0000 mg | ORAL_TABLET | Freq: Two times a day (BID) | ORAL | 1 refills | Status: DC | PRN

## 2018-10-23 NOTE — Progress Notes (Signed)
[facility]  Patient ID: Glenn [name],  MRN: [id number]  PCP: Marin Olp, MD  Date of Evaluation: 10/23/2018 Time spent:20 minutes  Chief Complaint:  Chief Complaint    Anxiety; Depression; Panic Attack      HISTORY/CURRENT STATUS: Glenn [name] is seen individually face-to-face with consent not collateral for psychiatric interview and exam in 4-week evaluation and management of major depression, GAD and panic, and alcohol and cannabis use.  1 week after last appointment he phoned requesting therapy referral starting through his human resources at work instead of here as he requested with Nunzio Cobbs, LCSW for one session thus far addressing object relations and loss for death of both parents and dog as well as mid-life health issues that require changes in his activities of daily living.  In the interim, he sleeps better taking trazodone 100 mg from the past as he has stopped nortriptyline successfully and started Ritalin. Ritalin helps fatigue but is not sufficient at 10 mg twice daily as he works from arising at [employer] for job at 0400 until usually 1700.  Trazodone 100 mg may make it difficult to arise at 0330. However despite these positive changes, he has no benefit for his anxiety or depression.  He uses little or no alcohol but continues tobacco cigarettes and chew.  Counseling for tobacco cessation as well as weight reduction are optimized as his improved work energy with less fatigue on Ritalin and his psychotherapy offer hope for therapeutic change, justifiying maximizing medication management currently.  After losing weight from 220 to 206 pounds, he is back up to 213 pounds.  Depression       The patient presents with depression.  This is a recurrent problem.  The current episode started more than 1 month ago.   The onset quality is sudden.   The problem occurs intermittently.  The problem has been gradually worsening since onset.  Associated symptoms include decreased  concentration, fatigue, helplessness, hopelessness, insomnia, decreased interest, appetite change and sad.  Associated symptoms include no suicidal ideas.     The symptoms are aggravated by social issues, medication and work stress.  Past treatments include SSRIs - Selective serotonin reuptake inhibitors, other medications and psychotherapy.  Compliance with treatment is variable.  Past compliance problems include difficulty with treatment plan, medication issues and medical issues.  Previous treatment provided mild relief.  Risk factors include a change in medication usage/dosage, alcohol intake, family history, family history of mental illness, history of mental illness, major life event, prior traumatic experience and stress.   Past medical history includes chronic fatigue syndrome, anxiety, depression and mental health disorder.     Pertinent negatives include no life-threatening condition, no physical disability, no recent psychiatric admission, no bipolar disorder, no eating disorder, no obsessive-compulsive disorder, no post-traumatic stress disorder, no schizophrenia, no suicide attempts and no head trauma.   Individual Medical History/ Review of Systems: Changes? :Yes He has no interim pancreatitis symptoms or findings though quantitation of obesity varies having also hypertriglyceridemia mostly off alcohol currently still using tobacco.  His chronicity of illness complaints now makes periodic improvements less frequent and satisfying.  Allergies: Morphine and related  Current Medications:  Current Outpatient Medications:  .  traZODone (DESYREL) 100 MG tablet, Take 150 mg by mouth at bedtime., Disp: , Rfl:  .  b complex vitamins capsule, Take 1 capsule by mouth daily., Disp: , Rfl:  .  Colchicine (MITIGARE) 0.6 MG CAPS, Take 1 capsule by mouth 2 (two) times daily as  needed., Disp: 30 capsule, Rfl: 0 .  diazepam (VALIUM) 10 MG tablet, Take 1 tablet (10 mg total) by mouth 2 (two) times daily as  needed for anxiety (panic)., Disp: 60 tablet, Rfl: 1 .  fluvoxaMINE (LUVOX) 100 MG tablet, Take 3 tablets (300 mg total) by mouth at bedtime., Disp: 90 tablet, Rfl: 2 .  losartan (COZAAR) 100 MG tablet, Take 1 tablet (100 mg total) by mouth daily., Disp: 90 tablet, Rfl: 1 .  methylphenidate (RITALIN) 10 MG tablet, Take 1 tablet (10 mg total) by mouth 3 (three) times daily with meals for 30 days., Disp: 90 tablet, Rfl: 0 .  [START ON 11/22/2018] methylphenidate (RITALIN) 10 MG tablet, Take 1 tablet (10 mg total) by mouth 3 (three) times daily with meals for 30 days., Disp: 90 tablet, Rfl: 0 .  [START ON 12/22/2018] methylphenidate (RITALIN) 10 MG tablet, Take 1 tablet (10 mg total) by mouth 3 (three) times daily with meals for 30 days., Disp: 90 tablet, Rfl: 0 Medication Side Effects: hypersomnolence  Family Medical/ Social History: Changes? No  MENTAL HEALTH EXAM: Muscle strengths and tone 5/5, postural reflexes and gait 0/0, and AIMS = 0. Blood pressure 120/82, pulse 84, height 5\' 8"  (1.727 m), weight 213 lb (96.6 kg).Body mass index is 32.39 kg/m.  General Appearance: Casual, Disheveled, Guarded and Obese  Eye Contact:  Good  Speech:  Clear and Coherent and Talkative  Volume:  Normal  Mood:  Anxious, Depressed, Dysphoric, Hopeless, Irritable and Worthless  Affect:  Constricted, Depressed, Restricted and Anxious  Thought Process:  Coherent and Linear  Orientation:  Full (Time, Place, and Person)  Thought Content: Obsessions and Rumination   Suicidal Thoughts:  No  Homicidal Thoughts:  No  Memory:  Immediate;   Good Remote;   Fair  Judgement:  Fair  Insight:  Fair and Lacking  Psychomotor Activity:  Decreased and Mannerisms  Concentration:  Concentration: Fair and Attention Span: Fair  Recall:  [employer] of Knowledge: Fair  Language: Fair  Assets:  [employer] Talents/Skills  ADL's:  Impaired  Cognition: WNL  Prognosis:  Fair    DIAGNOSES:    ICD-10-CM   1. Moderate  recurrent major depression (HCC) F33.1 methylphenidate (RITALIN) 10 MG tablet    methylphenidate (RITALIN) 10 MG tablet  2. GAD (generalized anxiety disorder) F41.1 fluvoxaMINE (LUVOX) 100 MG tablet    diazepam (VALIUM) 10 MG tablet  3. Panic disorder F41.0 fluvoxaMINE (LUVOX) 100 MG tablet    diazepam (VALIUM) 10 MG tablet  4. 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 Chronic fatigue R53.82     Receiving Psychotherapy: Yes  Nunzio Cobbs, LCSW   RECOMMENDATIONS: Now that he is off nortriptyline and the trazodone will be reduced by 50% of the 100 mg tablet to take 1/2 tablet total 50 mg nightly as needed for insomnia current supply for insomnia due to anxiety and depression, Luvox will be increased 100 mg tablet to 3 tablets total 300 mg every bedtime sent as #90 with 2 refills to [facility] on [address].  Ritalin is increased to 10 mg 3 times daily sent as #90 each for February, March, and April with prescription for February replacing the previous of lower dose from last appointment for chronic fatigue and associated depression.  He continues diazepam 10 mg twice daily as needed for GAD as #60 with 1 refill to  [facility]. He continues therapy and returns in 2 months being educated on medication risk and proper use as alcohol is nearly stopped and he is poised to reduce tobacco.   Delight Hoh, MD

## 2018-10-28 DIAGNOSIS — R5382 Chronic fatigue, unspecified: Secondary | ICD-10-CM | POA: Insufficient documentation

## 2018-10-28 DIAGNOSIS — F0631 Mood disorder due to known physiological condition with depressive features: Secondary | ICD-10-CM | POA: Insufficient documentation

## 2018-11-15 ENCOUNTER — Encounter: Payer: Self-pay | Admitting: Family Medicine

## 2018-11-19 ENCOUNTER — Encounter: Payer: Self-pay | Admitting: Family Medicine

## 2018-11-19 ENCOUNTER — Telehealth: Payer: Self-pay | Admitting: Psychiatry

## 2018-11-19 MED ORDER — VILAZODONE HCL 10 & 20 MG PO KIT: 10.0000 mg | PACK | Freq: Every day | ORAL | 0 refills | Status: DC

## 2018-11-19 NOTE — Addendum Note (Signed)
Addended by: Delight Hoh on: 11/19/2018 06:03 PM   Modules accepted: Orders

## 2018-11-19 NOTE — Telephone Encounter (Signed)
Patient called stated he received a my chart message from you but could not see it.  Please re-send.

## 2018-11-19 NOTE — Telephone Encounter (Signed)
I phoned patient that neither myself nor this office have sent him a message in My Chart which he thinks might be sent again.  He has been to see his primary care physician and is looking for information to give his workplace on anxiety and depression.  He suggests that someone sent him such information, but he then discusses with me that he reduced Luvox to 200 mg nightly from 300 as he felt nauseous but did not have acute pancreatitis.  He now wants to stop the Luvox as it has not provided any benefit though the Ritalin does help his focus, motivation and mood.  He inquires about getting lithium or a real antidepressant stating something has to get him out of bed but he can take care of his dogs.  I suggested to him that he may need to get out of bed and take care of the dogs in order to feel less anxious and depressed.  He will stop the Luvox and pick up samples of Viibryd and 2 starter packs to take 10 mg daily for 7 days then 20 mg daily for 7 days, then 40 mg daily for 5 days after the evening meal being provided the coupon and may call for E scription for the 40 mg every evening meal if helping.  He states that he drinks water all day long relative to questioning whether he is a not a good candidate for lithium augmentation of antidepressant.

## 2018-11-23 ENCOUNTER — Telehealth: Payer: Self-pay | Admitting: Psychiatry

## 2018-11-28 NOTE — Telephone Encounter (Signed)
ERROR

## 2018-12-03 ENCOUNTER — Telehealth: Payer: Self-pay | Admitting: Psychiatry

## 2018-12-03 ENCOUNTER — Ambulatory Visit (INDEPENDENT_AMBULATORY_CARE_PROVIDER_SITE_OTHER): Payer: Self-pay | Admitting: Family Medicine

## 2018-12-03 ENCOUNTER — Encounter: Payer: Self-pay | Admitting: Family Medicine

## 2018-12-03 VITALS — BP 114/70 | Temp 98.7°F

## 2018-12-03 DIAGNOSIS — R5383 Other fatigue: Secondary | ICD-10-CM

## 2018-12-03 DIAGNOSIS — F1021 Alcohol dependence, in remission: Secondary | ICD-10-CM

## 2018-12-03 DIAGNOSIS — R739 Hyperglycemia, unspecified: Secondary | ICD-10-CM

## 2018-12-03 DIAGNOSIS — F321 Major depressive disorder, single episode, moderate: Secondary | ICD-10-CM

## 2018-12-03 DIAGNOSIS — R5382 Chronic fatigue, unspecified: Secondary | ICD-10-CM

## 2018-12-03 DIAGNOSIS — K852 Alcohol induced acute pancreatitis without necrosis or infection: Secondary | ICD-10-CM

## 2018-12-03 NOTE — Assessment & Plan Note (Signed)
Blood sugar was high in the hospital and required insulin-we will get an A1c given fatigue

## 2018-12-03 NOTE — Progress Notes (Signed)
Phone [phone]   Subjective:  Virtual visit via Video note  Our team/I connected with Glenn [name] on 12/03/18 at  3:40 PM EDT by a video enabled telemedicine application ([email or web address]) and verified that I am speaking with the correct person using two identifiers.  Location patient: [facility] Location provider: [facility], office Persons participating in the virtual visit:  patient  Our team/I discussed the limitations of evaluation and management by telemedicine and the availability of in person appointments. In light of current covid-19 pandemic, patient also understands that we are trying to protect them by minimizing in office contact if at all possible.  The patient expressed consent for telemedicine visit and agreed to proceed.   ROS- no regular cough, fever, shortness of breath, chest pain.    Past Medical History-  Patient Active Problem List   Diagnosis Date Noted  . Alcohol use disorder, moderate, in early remission ([place]) 02/11/2018    Priority: High  . Chronic fatigue 10/28/2018    Priority: Medium  . Moderate major depression ([place]) 07/02/2018    Priority: Medium  . Hepatic steatosis 02/11/2018    Priority: Medium  . Tobacco abuse 02/24/2015    Priority: Medium  . Essential hypertension, benign 02/18/2014    Priority: Medium  . Panic disorder 12/10/2013    Priority: Medium  . Hyperglycemia 02/11/2018    Priority: Low  . Pancreatitis, alcoholic, acute 16/38/4665    Priority: Low  . Acute pain of right knee 05/14/2018  . Elevated uric acid in blood 03/16/2018    Medications- reviewed and updated Current Outpatient Medications  Medication Sig Dispense Refill  . b complex vitamins capsule Take 1 capsule by mouth daily.    . Colchicine (MITIGARE) 0.6 MG CAPS Take 1 capsule by mouth 2 (two) times daily as needed. 30 capsule 0  . diazepam (VALIUM) 10 MG tablet Take 1 tablet (10 mg total) by mouth 2 (two) times daily as needed for anxiety (panic). 60 tablet 1  .  losartan (COZAAR) 100 MG tablet Take 1 tablet (100 mg total) by mouth daily. 90 tablet 1  . methylphenidate (RITALIN) 10 MG tablet Take 1 tablet (10 mg total) by mouth 3 (three) times daily with meals for 30 days. 90 tablet 0  . methylphenidate (RITALIN) 10 MG tablet Take 1 tablet (10 mg total) by mouth 3 (three) times daily with meals for 30 days. 90 tablet 0  . [START ON 12/22/2018] methylphenidate (RITALIN) 10 MG tablet Take 1 tablet (10 mg total) by mouth 3 (three) times daily with meals for 30 days. 90 tablet 0  . traZODone (DESYREL) 100 MG tablet Take 150 mg by mouth at bedtime.    . Vilazodone HCl (VIIBRYD STARTER PACK) 10 & 20 MG KIT Take 10 mg by mouth daily after supper for 7 days. Then take 20 mg by mouth after supper for 7 days then take 40 mg after supper thereafter 2 kit 0   No current facility-administered medications for this visit.      Objective:  Needs to get new batteries for home cuff.  Gen: NAD, resting comfortably-long beard noted Lungs: nonlabored, normal respiratory rate  Skin: appears dry, no obvious rash Normal speech     Assessment and Plan   # Fatigue #Major depression-poor control #Alcoholism S:patient has been feeling fatigued for 2 months. He admits he is dealing with depression- Dr. Milana Huntsman is his psychiatrist- he doesn't want to take viibryd due to pancreatitis risks. Had been on fluexetine. Taking valium  if needed- plans to call psychiatry back  He thinks he is down 3-4 lbs since last visit- feels like eating less with ritalin  sleeping until 2 PM- used to waking up early. When he wakes up does not feel rested. Not sure if snoring. Seems to be ok once he gets to work. Not nodding off in daytime.    Does not have home scale but feels like he has lost weight.   A/P: I strongly suspect patient's fatigue is related to his severe depression. -Patient states he is looking to get a call back from his psychiatrist about potentially using other  therapeutic options-he does not want to use Viibryd due to pancreatitis risk - Thankfully patient has reduced alcohol use-1-2 beers 3x a week but he knows he needs to quit completely -Patient wants to make sure blood work is stable-for that reason we will update blood work to look for any potential cause of fatigue. -Patient also asked for testosterone to be checked as he has not noted morning erections  Please note in regards for him to coming by for labs- no fever/chills/regular cough- has intermittently noted rare cough with allergies-consider chest x-ray but he declines any shortness of breath.  With smoking history I think it would be reasonable to get x-ray if has confirmed weight loss or worsening fatigue or persistent fatigue.  #Tobacco use-Quarter can a day-recommended complete cessation but patient is not ready to quit  Future Appointments  Date Time Provider [place]  12/20/2018  3:20 PM Delight Hoh, MD CP-CP None   Lab/Order associations: Fatigue, unspecified type - Plan: CBC with Differential/Platelet, Comprehensive metabolic panel, TSH, Testos,Total,Free and SHBG (Male)  Moderate major depression ([place])  Alcohol use disorder, moderate, in early remission ([place])  Alcohol-induced acute pancreatitis, unspecified complication status  Hyperglycemia - Plan: Hemoglobin A1c  Chronic fatigue   Return precautions advised.  [name], MD

## 2018-12-03 NOTE — Assessment & Plan Note (Signed)
See chronic fatigue section.  Patient is having some suicidal thoughts- he states if he is thoughts were progressive he will call 911.  His counselor is aware.  His psychiatrist is also reportedly aware- patient states he is waiting for a call back today with further instructions about medications-I am in agreement with this plan to call 911 if has progressive thoughts of self-harm

## 2018-12-03 NOTE — Assessment & Plan Note (Signed)
S:patient has been feeling fatigued for 2 months. He admits he is dealing with depression- Dr. Milana Huntsman is his psychiatrist- he doesn't want to take viibryd due to pancreatitis risks. Had been on fluexetine. Taking valium if needed- plans to call psychiatry back  He thinks he is down 3-4 lbs since last visit- feels like eating less with ritalin  sleeping until 2 PM- used to waking up early. When he wakes up does not feel rested. Not sure if snoring. Seems to be ok once he gets to work. Not nodding off in daytime.    Does not have home scale but feels like he has lost weight.   A/P: I strongly suspect patient's fatigue is related to his severe depression. -Patient states he is looking to get a call back from his psychiatrist about potentially using other therapeutic options-he does not want to use Viibryd due to pancreatitis risk - Thankfully patient has reduced alcohol use-1-2 beers 3x a week but he knows he needs to quit completely -Patient wants to make sure blood work is stable-for that reason we will update blood work to look for any potential cause of fatigue. -Patient also asked for testosterone to be checked as he has not noted morning erections

## 2018-12-03 NOTE — Patient Instructions (Addendum)
Video visit

## 2018-12-03 NOTE — Telephone Encounter (Signed)
Devan called with concerns about taking the Viibryd.  He has had pancreatis and the med may cause that.  Please call to discuss.  Has appt 4/23

## 2018-12-03 NOTE — Telephone Encounter (Signed)
Patient phones that he missed work today because he slept until 2 PM.  He notes they are on a 5-hour work schedule which usually requires him to get up at 3:30 AM so that he usually sleeps till about 5:30 AM when he is off work.  He therefore called in sick today.  He thinks depression is keeping him in bed even though his dog tries to get him out of bed.  He dd not start the Qu[place] as he worries it might cause pancreatitis.  I encourage him to get the medical opinion of his primary care team for his pancreatitis as he continues to ask for more medication for depression but appropriately fears a flareup of pancreatitis again if he starts drinking and is not sure about antidepressant medication causing such.  I clarify the reasons for recommending the Viibryd when he has tried so many medications that he concludes did not help him the way he needed.  However I also support him just using therapy and behavioral modification for himself to get out of bed going to work active and interested in friends and family to help his depression as opposed to only considering medication.  He concludes he will likely start the medication as his primary care did not have any concern about the Viibryd but only the patient himself.

## 2018-12-20 ENCOUNTER — Other Ambulatory Visit: Payer: Self-pay

## 2018-12-20 ENCOUNTER — Encounter: Payer: Self-pay | Admitting: Psychiatry

## 2018-12-20 ENCOUNTER — Ambulatory Visit (INDEPENDENT_AMBULATORY_CARE_PROVIDER_SITE_OTHER): Payer: 59 | Admitting: Psychiatry

## 2018-12-20 DIAGNOSIS — F0631 Mood disorder due to known physiological condition with depressive features: Secondary | ICD-10-CM

## 2018-12-20 DIAGNOSIS — F410 Panic disorder [episodic paroxysmal anxiety] without agoraphobia: Secondary | ICD-10-CM

## 2018-12-20 DIAGNOSIS — F321 Major depressive disorder, single episode, moderate: Secondary | ICD-10-CM

## 2018-12-20 DIAGNOSIS — Z720 Tobacco use: Secondary | ICD-10-CM

## 2018-12-20 DIAGNOSIS — F411 Generalized anxiety disorder: Secondary | ICD-10-CM

## 2018-12-20 DIAGNOSIS — F1021 Alcohol dependence, in remission: Secondary | ICD-10-CM

## 2018-12-20 DIAGNOSIS — F331 Major depressive disorder, recurrent, moderate: Secondary | ICD-10-CM

## 2018-12-20 MED ORDER — SERTRALINE HCL 50 MG PO TABS: 50.0000 mg | ORAL_TABLET | Freq: Every day | ORAL | 1 refills | Status: DC

## 2018-12-20 MED ORDER — METHYLPHENIDATE HCL 10 MG PO TABS: 10.0000 mg | ORAL_TABLET | Freq: Three times a day (TID) | ORAL | 0 refills | Status: DC

## 2018-12-20 MED ORDER — DIAZEPAM 10 MG PO TABS: 10.0000 mg | ORAL_TABLET | Freq: Two times a day (BID) | ORAL | 1 refills | Status: DC | PRN

## 2018-12-20 NOTE — Progress Notes (Signed)
[facility]  Patient ID: Glenn [name],  MRN: [id number]  PCP: Marin Olp, MD  Date of Evaluation: 12/20/2018 Time spent:20 minutes from 1520 to 1540  I connected with patient by a video enabled telemedicine application or telephone, with their informed consent, and verified patient privacy and that I am speaking with the correct person using two identifiers.  I was located at [facility] and patient individually at his residence.  Chief Complaint:  Chief Complaint    Depression; Anxiety; Panic Attack      HISTORY/CURRENT STATUS: Glenn [name] is provided telemedicine audio, as he refuses to turn on video camera due to panic disorder, with consent with epic collateral for psychiatric interview and exam in 4-week evaluation and management of recurrent major depression, provisional mood disorder due to medical condition he fears to be fibromyalgia like mother, GAD with panic, and alcohol and tobacco use disorders.  This is the 4th appointment here in 5 months for relapse of depressive symptoms following death of father when depression prior to that had been mainly organized around death of mother over 10 years ago.  Mother had fibromyalgia, chronic pain, seizures, and other medical concerns while father had a viral syndrome following pelvis fracture last January.  He had acute pancreatitis last June months ago considered predominantly associated with alcohol and still drinks possibly 6 beers weekly and is smoking.  He took higher dose Luvox up to daily maximum 300 mg for 2 years helpful for anxiety more than depression, before and after his pancreatitis without difficulty stopped due to loss of efficacy tapering himself for lack of benefit more than any side effects. Augmentation of Luvox by Pamelor, Geodon, and then Ritalin like his brother were of no benefit except reduced fatigue.  He took samples of Viibryd only 2 weeks once cleared by PCP before he stopped for lack of benefit  being afraid it would cause pancreatitis, noting shakes upon awakening.  He does take Valium and Ritalin successfully but not trazodone which does not help.  Patient and Dr. Yong Channel are seeking more depression treatment, and I am sharing at his last PCP appointment 2 weeks ago that he had suicidal ideation with his depression at times [facility] concluding that depression is the cause of fatigue and not other medical matters.  However Glenn [name] states that Dr. Yong Channel is concerned about thyroid as the reason for his fatigue and will test as soon as possible relative to coronavirus pandemic.  Glenn [name] has no psychosis, mania, dissociation, or delirium.  Therapy with Rosary Lively, LPC and Nunzio Cobbs, LCSW were both brief 1 or 2 sessions and unhelpful.  Depression       The patient presents with depression.  This is a recurrent problem.  The current episode started more than 1 month ago.   The onset quality is sudden.   The problem occurs intermittently.  The problem has been gradually worsening since onset.  Associated symptoms include decreased concentration, fatigue, helplessness, hopelessness, decreased interest, sad and suicidal ideas.  Associated symptoms include does not have insomnia, no restlessness, no appetite change and no headaches.     The symptoms are aggravated by work stress, medication, social issues and family issues.  Past treatments include SSRIs - Selective serotonin reuptake inhibitors, TCAs - Tricyclic antidepressants, other medications and psychotherapy.  Compliance with treatment is variable.  Past compliance problems include medical issues, difficulty with treatment plan and medication issues.  Previous treatment provided mild relief.  Risk factors include a change in medication  usage/dosage, a recent illness, alcohol intake, family history, family history of mental illness, history of mental illness, major life event and stress.   Past medical history includes chronic fatigue syndrome, anxiety,  depression and mental health disorder.     Pertinent negatives include no life-threatening condition, no physical disability, no recent psychiatric admission, no bipolar disorder, no eating disorder, no obsessive-compulsive disorder, no post-traumatic stress disorder, no schizophrenia, no suicide attempts and no head trauma.   Individual Medical History/ Review of Systems: Changes? :Yes Dr. Yong Channel emphasizing no medical basis for fatigue rather that depression is the problem needing more treatment for depression when the patient seems to avoid medication fearing pancreatitis from such even though still drinking and smoking.  However the patient considers Dr. Yong Channel will be checking his thyroid.  He reports occasional diarrhea but no constipation.  Hopefully there is no underlying pancreatic order that might be medically triggering depression.  Allergies: Morphine and related  Current Medications:  Current Outpatient Medications:  .  b complex vitamins capsule, Take 1 capsule by mouth daily., Disp: , Rfl:  .  Colchicine (MITIGARE) 0.6 MG CAPS, Take 1 capsule by mouth 2 (two) times daily as needed., Disp: 30 capsule, Rfl: 0 .  diazepam (VALIUM) 10 MG tablet, Take 1 tablet (10 mg total) by mouth 2 (two) times daily as needed for anxiety (panic)., Disp: 60 tablet, Rfl: 1 .  losartan (COZAAR) 100 MG tablet, Take 1 tablet (100 mg total) by mouth daily., Disp: 90 tablet, Rfl: 1 .  methylphenidate (RITALIN) 10 MG tablet, Take 1 tablet (10 mg total) by mouth 3 (three) times daily with meals for 30 days., Disp: 90 tablet, Rfl: 0 .  [START ON 12/22/2018] methylphenidate (RITALIN) 10 MG tablet, Take 1 tablet (10 mg total) by mouth 3 (three) times daily with meals for 30 days., Disp: 90 tablet, Rfl: 0 .  [START ON 01/21/2019] methylphenidate (RITALIN) 10 MG tablet, Take 1 tablet (10 mg total) by mouth 3 (three) times daily with meals for 30 days., Disp: 90 tablet, Rfl: 0 .  sertraline (ZOLOFT) 50 MG tablet, Take 1  tablet (50 mg total) by mouth daily at 12 noon., Disp: 30 tablet, Rfl: 1 .  traZODone (DESYREL) 100 MG tablet, Take 150 mg by mouth at bedtime., Disp: , Rfl:    Medication Side Effects: none  Family Medical/ Social History: Changes? Yes support from brother of no benefit though on Ritalin first.  He still considers work too difficult though only from 0530 to 1130 hours daily at the most for coronavirus damage calling for work excuse 12/03/2018 when appointing with Dr. Yong Channel that day also.  MENTAL HEALTH EXAM:  There were no vitals taken for this visit.There is no height or weight on file to calculate BMI.  As not present here today   General Appearance: N/A  Eye Contact:  N/A  Speech:  Clear and Coherent, Normal Rate and Talkative  Volume:  Normal  Mood:  Angry, Anxious, Depressed, Dysphoric, Irritable and Worthless  Affect:  Depressed, Inappropriate, Full Range and Anxious  Thought Process:  Coherent, Irrelevant and Linear  Orientation:  Full (Time, Place, and Person)  Thought Content: Ilusions, Obsessions and Rumination   Suicidal Thoughts:  Yes.  without intent/plan  Homicidal Thoughts:  No  Memory:  Immediate;   Good Remote;   Fair  Judgement:  Impaired  Insight:  Lacking  Psychomotor Activity:  Normal, Decreased, Mannerisms and Psychomotor Retardation  Concentration:  Concentration: Fair and Attention Span: Good  Recall:  Roel Cluck of Knowledge: Fair  Language: Fair  Assets:  Leisure Time Resilience Talents/Skills  ADL's:  Intact  Cognition: WNL  Prognosis:  Fair    DIAGNOSES:    ICD-10-CM   1. Moderate major depression (HCC) F32.1 sertraline (ZOLOFT) 50 MG tablet  2. GAD (generalized anxiety disorder) F41.1 diazepam (VALIUM) 10 MG tablet    sertraline (ZOLOFT) 50 MG tablet  3. Panic disorder F41.0 diazepam (VALIUM) 10 MG tablet    sertraline (ZOLOFT) 50 MG tablet  4. Mood disorder with depressive features due to general medical condition F06.31 methylphenidate  (RITALIN) 10 MG tablet  5. Alcohol use disorder, moderate, in early remission ([place]) F10.21   6. Tobacco abuse Z72.0   7. Moderate recurrent major depression ([place]) F33.1     Receiving Psychotherapy: No As he did not follow through with therapy started with Rosary Lively, [facility] or Nunzio Cobbs, LCSW   RECOMMENDATIONS: The patient expects an antidepressant that is activating for his depression related fatigue unless thyroid lab in the future might be abnormal.  The patient concluded that Viibryd is a new medication and may have pancreatitis risk.  Zoloft has been on the market for 35 years and is in the same family as Luvox marketed in the same decade. Zoloft has been  used in patients with medical illness safely even though case reports can be found in the world literature of Zoloft for depression causing pancreatitis though not different than other antidepressants and not easily distinguished from lifestyle changes might include alcohol and tobacco.  Zoloft is sent 50 mg every noon after work #30 and 1 refill to [facility] at [employer] for depression and anxiety being off of Viibryd and Teacher, music but taking his diazepam and Ritalin. The patient seeks to try this medication that has long time course of experience.  Zoloft is after work in case of flatus or diarrhea. He continues Valium 10 mg twice daily as needed and Ritalin 10 mg 3 times daily as needed current supply for anxiety with panic and fatigue with insomnia.  He is not taking trazodone.  He returns 2 months.  Virtual Visit via Video Note  I connected with [name] on 12/20/18 at  3:20 PM EDT by a video enabled telemedicine application and verified that I am speaking with the correct person using two identifiers.   I discussed the limitations of evaluation and management by telemedicine and the availability of in person appointments. The patient expressed understanding and agreed to proceed.  History of Present Illness: 4-week  evaluation and management of recurrent major depression, provisional mood disorder due to medical condition he fears to be fibromyalgia like mother, GAD with panic, and alcohol and tobacco use disorders.  This is the 4th appointment here in 5 months for relapse of depressive symptoms following death of father when depression prior to that had been mainly organized around death of mother over 10 years ago.  Mother had fibromyalgia, chronic pain, seizures, and other medical concerns while father had a viral syndrome following pelvis fracture last January.  He had acute pancreatitis last June 2019 considered predominantly associated with alcohol and still drinks possibly 6 beers weekly and is smoking.  He took higher dose Luvox up to daily maximum 300 mg for 2 years helpful for anxiety more than depression, before and after his pancreatitis without difficulty stopped due to loss of efficacy tapering himself for lack of benefit more than any side effects.    Observations/Objective: Mood:  Angry, Anxious, Depressed, Dysphoric, Irritable and Worthless  Affect:  Depressed, Inappropriate, Full Range and Anxious  Thought Process:  Coherent, Irrelevant and Linear  Orientation:  Full (Time, Place, and Person)  Thought Content: Ilusions, Obsessions and Rumination     Assessment and Plan: Zoloft is sent 50 mg every noon after work #30 and 1 refill to [facility] [employer] for depression and anxiety being off of Viibryd and Teacher, music but taking his diazepam and Ritalin. The patient seeks to try this medication that has long time course of experience.  Zoloft is after work in case of flatus or diarrhea. He continues Valium 10 mg twice daily as needed and Ritalin 10 mg 3 times daily as needed current supply for anxiety with panic and fatigue with insomnia.  He is not taking trazodone.  Follow Up Instructions:  He returns 2 months.    I discussed the assessment and treatment plan with the patient. The patient  was provided an opportunity to ask questions and all were answered. The patient agreed with the plan and demonstrated an understanding of the instructions.   The patient was advised to call back or seek an in-person evaluation if the symptoms worsen or if the condition fails to improve as anticipated.  I provided 20 minutes of non-face-to-face time during this encounter.   Delight Hoh, MD  Delight Hoh, MD

## 2019-02-13 ENCOUNTER — Encounter: Payer: Self-pay | Admitting: Family Medicine

## 2019-02-13 ENCOUNTER — Telehealth: Payer: Self-pay | Admitting: Psychiatry

## 2019-02-13 NOTE — Telephone Encounter (Signed)
Received after hours call from patient who reports that he has been having suicidal thoughts for a couple of months. He denies any recent worsening in passive death wishes/suicidal thoughts and reports that he has not shared these thoughts with Glenn [name] in the past and thought he needed to make Glenn [name] aware of current symptoms.  Patient reports that he has been having thoughts of wanting to sleep and not waking up.  He denies suicidal intent and identifies multiple barriers and reasons not to harm himself to include being on certain about what happens after death, not wanting to upset Glenn family, leave Glenn [name] alone after they have lost multiple family members, and leaving Glenn dogs.  Pt contracts fully for safety and agrees to go to the ER or call 911 if he started to experience suicidal intent.  Patient also agrees to contact office if symptoms were to worsen.  Patient reports that he has been in thinking frequently about losses, to include the death of Glenn father 1/2 years ago, the loss of Glenn mother, and Glenn grandmothers death a couple of weeks ago.  He reports that he has also had increased stress with dealing with grandmothers estate and life insurance.  He reports that he has been sleeping late and missing work.  Patient reports that he had started therapist and making some progress prior to the pandemic but has not been able to see her recently due to COVID-19.  Patient reports that he has also had abnormal thyroid levels and PCP is addressing this.  Patient reports that he is also having some unintentional weight loss and questions if thyroid may be contributing to fatigue and weight changes.  Patient reports that he was unaware that he has a scheduled appointment next week with Glenn [name].  Patient requests to be seen sooner by Glenn [name] if possible.  Will relay information to Glenn [name] and to office staff.

## 2019-02-14 ENCOUNTER — Ambulatory Visit (INDEPENDENT_AMBULATORY_CARE_PROVIDER_SITE_OTHER): Payer: Self-pay | Admitting: Family Medicine

## 2019-02-14 ENCOUNTER — Telehealth: Payer: Self-pay | Admitting: Family Medicine

## 2019-02-14 ENCOUNTER — Telehealth: Payer: Self-pay | Admitting: General Practice

## 2019-02-14 ENCOUNTER — Encounter: Payer: Self-pay | Admitting: Family Medicine

## 2019-02-14 VITALS — BP 120/74 | Temp 99.1°F | Ht 68.0 in | Wt 190.0 lb

## 2019-02-14 DIAGNOSIS — R05 Cough: Secondary | ICD-10-CM

## 2019-02-14 DIAGNOSIS — R509 Fever, unspecified: Secondary | ICD-10-CM

## 2019-02-14 DIAGNOSIS — R059 Cough, unspecified: Secondary | ICD-10-CM

## 2019-02-14 DIAGNOSIS — Z20822 Contact with and (suspected) exposure to covid-19: Secondary | ICD-10-CM

## 2019-02-14 MED ORDER — DOXYCYCLINE HYCLATE 100 MG PO TABS: 100.0000 mg | ORAL_TABLET | Freq: Two times a day (BID) | ORAL | 0 refills | Status: DC

## 2019-02-14 NOTE — Addendum Note (Signed)
Addended by: Dimple Nanas on: 02/14/2019 05:28 PM   Modules accepted: Orders

## 2019-02-14 NOTE — Telephone Encounter (Signed)
Pt has been scheduled for covid testing.  Referred by: [name] MD

## 2019-02-14 NOTE — Telephone Encounter (Signed)
Patient: Glenn [name] MRN: [id number] DOB: July 27, 1981  Reason for test: fever, cough  Insurance information   Primary Visit Coverage  Payer Plan Sponsor Code Group Number Group Name  [place]  [phone]   Primary Visit Coverage Subscriber  ID Name SSN Address  [id number] Gu[place] [id number]     [place], [place] [address]  Secondary Visit Coverage  Payer Plan Sponsor Code Group Number Group Name  [place] OTHER  [id number]   Secondary Visit Coverage Subscriber  ID Name SSN Address  [id number] [place] [id number] [place]     [address], [place] [address]

## 2019-02-14 NOTE — Patient Instructions (Addendum)
There are no preventive care reminders to display for this patient.  Video visit

## 2019-02-14 NOTE — Progress Notes (Signed)
Phone [phone]   Subjective:  Virtual visit via Video note. Chief complaint: Chief Complaint  Patient presents with  . Acute Visit  . Fever  . Cough  . Tick Bite   This visit type was conducted due to national recommendations for restrictions regarding the COVID-19 Pandemic (e.g. social distancing).  This format is felt to be most appropriate for this patient at this time balancing risks to patient and risks to population by having him in for in person visit.  No physical exam was performed (except for noted visual exam or audio findings with Telehealth visits).    Our team/I connected with [name] at  4:00 PM EDT by a video enabled telemedicine application (doxy.me or caregility through epic) and verified that I am speaking with the correct person using two identifiers.  Location patient: Home[facility] Location provider: [facility], office Persons participating in the virtual visit:  patient  Our team/I discussed the limitations of evaluation and management by telemedicine and the availability of in person appointments. In light of current covid-19 pandemic, patient also understands that we are trying to protect them by minimizing in office contact if at all possible.  The patient expressed consent for telemedicine visit and agreed to proceed. Patient understands insurance will be billed.   ROS-no reported chest pain or shortness of breath.  Does complain of cough and fever the last fever was Wednesday night.  Past Medical History-  Patient Active Problem List   Diagnosis Date Noted  . Alcohol use disorder, moderate, in early remission ([place]) 02/11/2018    Priority: High  . Chronic fatigue 10/28/2018    Priority: Medium  . Moderate major depression ([place]) 07/02/2018    Priority: Medium  . Hepatic steatosis 02/11/2018    Priority: Medium  . Tobacco abuse 02/24/2015    Priority: Medium  . Essential hypertension, benign 02/18/2014    Priority: Medium  . Panic disorder  12/10/2013    Priority: Medium  . Hyperglycemia 02/11/2018    Priority: Low  . Pancreatitis, alcoholic, acute 16/02/3709    Priority: Low  . Mood disorder with depressive features due to general medical condition 10/28/2018  . Acute pain of right knee 05/14/2018  . Elevated uric acid in blood 03/16/2018    Medications- reviewed and updated Current Outpatient Medications  Medication Sig Dispense Refill  . b complex vitamins capsule Take 1 capsule by mouth daily.    . diazepam (VALIUM) 10 MG tablet Take 1 tablet (10 mg total) by mouth 2 (two) times daily as needed for anxiety (panic). 60 tablet 1  . losartan (COZAAR) 100 MG tablet Take 1 tablet (100 mg total) by mouth daily. 90 tablet 1  . methylphenidate (RITALIN) 10 MG tablet Take 1 tablet (10 mg total) by mouth 3 (three) times daily with meals for 30 days. 90 tablet 0  . sertraline (ZOLOFT) 50 MG tablet Take 1 tablet (50 mg total) by mouth daily at 12 noon. 30 tablet 1  . doxycycline (VIBRA-TABS) 100 MG tablet Take 1 tablet (100 mg total) by mouth 2 (two) times daily. 20 tablet 0   No current facility-administered medications for this visit.      Objective:  BP 120/74   Temp 99.1 F (37.3 C)   Ht 5\' 8"  (1.727 m)   Wt 190 lb (86.2 kg)   BMI 28.89 kg/m  self reported vitals Gen: NAD, resting comfortably Lungs: nonlabored, normal respiratory rate  Skin: appears dry, no obvious rash    Assessment and  Plan   # Tick Bite #Fever and Cough S:First noticed tick bite last Tuesday. He was able to remove the tick.  It was not engorged. States deer tick.  Denies increased warmth, erythema, swelling. No target lesion. The area was pretty itchy but it has improved. The location of the tick bite was right sided ribs.    C/o fever off and on since Monday. Temp got up to 102.2. last fever was Wednesday night. He has not taken anything for fever. Denies night sweats, chills, body aches, n/v, loss of taste or smell, chest congestion. Reports  occasional diarrhea. Occasional cough, non-productive cough, head congestion. He has not been around anyone who has tested positive for Covid-19 as far as he knows. No neck stiffness. 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 He called in on Tuesday and has not been back to work.  A/P: Patient developed fever and cough earlier this week- had tick bite last week.  Will treat with doxycycline for 10 days to cover for [facility] spotted fever and Lyme given systemic symptoms though doubt tickborne disease.  Patient should let us know immediately if any symptoms persist to 10 days.  Doxycycline would be reasonable coverage for CAP as well if present. There is also potential for COVID 19.  Patient with symptoms concerning for potential covid 19 Therefore: - message sent to [place] to set patient up for drive through testing - recommended patient watch closely for shortness of breath or confusion or worsening symptoms and if those occur he should contact us immediately -recommended self isolation until negative test AND 3 days fever free AND improvement in respiratory symptoms - work note written (see letter)  Future Appointments  Date Time Provider [place]  02/20/2019  4:40 PM Delight Hoh, MD CP-CP None   Lab/Order associations:   ICD-10-CM   1. Fever, unspecified fever cause  R50.9    Meds ordered this encounter  Medications  . doxycycline (VIBRA-TABS) 100 MG tablet    Sig: Take 1 tablet (100 mg total) by mouth 2 (two) times daily.    Dispense:  20 tablet    Refill:  0   Return precautions advised.  [name], MD

## 2019-02-14 NOTE — Telephone Encounter (Signed)
Pt has been scheduled for covid testing.  ° °

## 2019-02-15 ENCOUNTER — Telehealth: Payer: Self-pay | Admitting: Psychiatry

## 2019-02-15 ENCOUNTER — Other Ambulatory Visit: Payer: Self-pay

## 2019-02-15 DIAGNOSIS — Z20822 Contact with and (suspected) exposure to covid-19: Secondary | ICD-10-CM

## 2019-02-15 NOTE — Telephone Encounter (Signed)
Phone calls to Mr. Pitsenbarger 02/13/2019 in response to his after-hours call 02/12/2024 clarified that he had established brief supportive therapy through his workplace [employer] and from experience he was acquiring, called after-hours to discuss his ambivalence for ongoing care for depression and anxiety when developing a febrile illness which he did follow-up with Dr. Yong Channel confirming to the office is Rico Junker that he doing okay and take care of his medical needs and keep his routine appointment with mental health next week. as he establishes the primacy of his medical concerns.  Virgil had barely containable anxiety living after mother's death 33 years ago until father and his dog died in the last year, since then having mostly depression. He continues alcohol and cigarettes despite episode of pancreatitis, refusing antidepressants in case they would upset the pancreas estimating 6 beers weekly despite having been told by primary care that he had alcohol related pancreatitis. He is more dependent now with work, brother, and doctors expecting Dr. Yong Channel to find a thyroid answer to his problems and [place] a perfect medication in case such can be found. He continues to refuse to start therapy with Rosary Lively or Nunzio Cobbs where dependent features could be over hours of elaboration structured into healthy displacements or resolutions of mourning to again lead a fulfilling daily life. Appointment with myself, Gareth Eagle, yourself, or anyone is certainly supported if [place] is listening in emergency calls, Sorry for the on call uncertainty but such progressively undoes all treatment.  Office called Mr. Olver and he stated he wouldn't be able to see Dr. Creig Hines 02/13/2019 bc he's been sick and running a fever

## 2019-02-19 ENCOUNTER — Encounter: Payer: Self-pay | Admitting: Family Medicine

## 2019-02-19 LAB — NOVEL CORONAVIRUS, NAA: SARS-CoV-2, NAA: NOT DETECTED

## 2019-02-20 ENCOUNTER — Ambulatory Visit: Payer: 59 | Admitting: Psychiatry

## 2019-02-27 ENCOUNTER — Encounter: Payer: Self-pay | Admitting: Psychiatry

## 2019-02-27 ENCOUNTER — Ambulatory Visit (INDEPENDENT_AMBULATORY_CARE_PROVIDER_SITE_OTHER): Payer: 59 | Admitting: Psychiatry

## 2019-02-27 ENCOUNTER — Other Ambulatory Visit: Payer: Self-pay

## 2019-02-27 VITALS — Ht 68.0 in | Wt 196.0 lb

## 2019-02-27 DIAGNOSIS — F411 Generalized anxiety disorder: Secondary | ICD-10-CM | POA: Diagnosis not present

## 2019-02-27 DIAGNOSIS — F0631 Mood disorder due to known physiological condition with depressive features: Secondary | ICD-10-CM

## 2019-02-27 DIAGNOSIS — F321 Major depressive disorder, single episode, moderate: Secondary | ICD-10-CM | POA: Diagnosis not present

## 2019-02-27 DIAGNOSIS — F410 Panic disorder [episodic paroxysmal anxiety] without agoraphobia: Secondary | ICD-10-CM

## 2019-02-27 DIAGNOSIS — F17200 Nicotine dependence, unspecified, uncomplicated: Secondary | ICD-10-CM | POA: Insufficient documentation

## 2019-02-27 DIAGNOSIS — F1021 Alcohol dependence, in remission: Secondary | ICD-10-CM

## 2019-02-27 MED ORDER — METHYLPHENIDATE HCL 10 MG PO TABS: 10.0000 mg | ORAL_TABLET | Freq: Three times a day (TID) | ORAL | 0 refills | Status: DC

## 2019-02-27 MED ORDER — DIAZEPAM 10 MG PO TABS: 10.0000 mg | ORAL_TABLET | Freq: Two times a day (BID) | ORAL | 1 refills | Status: DC | PRN

## 2019-02-27 MED ORDER — SERTRALINE HCL 100 MG PO TABS: 100.0000 mg | ORAL_TABLET | Freq: Every day | ORAL | 0 refills | Status: DC

## 2019-02-27 NOTE — Progress Notes (Signed)
[facility]  Patient ID: HYMEN ARNETT,  MRN: [id number]  PCP: Marin Olp, MD  Date of Evaluation: 02/27/2019 Time spent:20 minutes  From 1540 to 1600 Chief Complaint:  Chief Complaint    Depression; Anxiety; Panic Attack      HISTORY/CURRENT STATUS: Malin is seen onsite in office face-to-face individually with consent with epic collateral for psychiatric interview and exam in 43-month evaluation and management of anxiety and depression attempting containment of alcohol and tobacco use.  Elijan phoned after hours 1.5 weeks ago with regressive fixations on loss reporting passive suicidal ideation which seemed to resolve with the after-hours support of on-call and office staff the next several days.  At the same time he saw Dr. Yong Channel with a fever being assessed for COVID currently negative.  He returned to work in the interim 02/20/2019 with fever resolved and continues to have ups and downs but is more functional and comfortable.  He notes that Zoloft from last appointment helps anxiety and depression a little, but he is much more comfortable with medication feeling confident that it is not harming his pancreas.  He seems to be using less alcohol but still dip tobacco.  Though he reports some EAP counseling, he does not yet formulate any definite improvement plan for mood and anxiety other than the option of increased Zoloft.  He does acknowledge that Ritalin and Valium remain helpful.  Pharmacy last provided June refill for the Ritalin using January E scription despite the interim updates.  He has no mania, current suicidality, psychosis, or delirium.  Depression        The patient presents with depression as a recurrent problem with current episode starting more than 1 month ago.   The onset quality is sudden.   The problem occurs intermittently.  The problem has been gradually worsening since onset.  Associated symptoms include decreased concentration, fatigue, helplessness,  hopelessness, decreased interest, sad and episodic suicidal ideas.  Associated symptoms include does not have insomnia, no restlessness, no appetite change and no headaches.     The symptoms are aggravated by work stress, medication, social issues and family issues.  Past treatments include SSRIs - Selective serotonin reuptake inhibitors, TCAs - Tricyclic antidepressants, other medications and psychotherapy.  Compliance with treatment is variable.  Past compliance problems include medical issues, difficulty with treatment plan and medication issues.  Previous treatment provided mild relief.  Risk factors include a change in medication usage/dosage, a recent illness, alcohol intake, family history, family history of mental illness, history of mental illness, major life event and stress.   Past medical history includes chronic fatigue syndrome, anxiety, depression and mental health disorder.     Pertinent negatives include no life-threatening condition, no physical disability, no recent psychiatric admission, no bipolar disorder, no eating disorder, no obsessive-compulsive disorder, no post-traumatic stress disorder, no schizophrenia, no suicide attempts and no head trauma.  Individual Medical History/ Review of Systems: Changes? :Yes As he abstains from earlier appointment here reportedly having fever and receiving a COVID test now better and negative.  Allergies: Morphine and related  Current Medications:  Current Outpatient Medications:  .  b complex vitamins capsule, Take 1 capsule by mouth daily., Disp: , Rfl:  .  diazepam (VALIUM) 10 MG tablet, Take 1 tablet (10 mg total) by mouth 2 (two) times daily as needed for anxiety (panic)., Disp: 60 tablet, Rfl: 1 .  doxycycline (VIBRA-TABS) 100 MG tablet, Take 1 tablet (100 mg total) by mouth 2 (two) times daily.,  Disp: 20 tablet, Rfl: 0 .  losartan (COZAAR) 100 MG tablet, Take 1 tablet (100 mg total) by mouth daily., Disp: 90 tablet, Rfl: 1 .   methylphenidate (RITALIN) 10 MG tablet, Take 1 tablet (10 mg total) by mouth 3 (three) times daily with meals., Disp: 90 tablet, Rfl: 0 .  [START ON 03/29/2019] methylphenidate (RITALIN) 10 MG tablet, Take 1 tablet (10 mg total) by mouth 3 (three) times daily with meals., Disp: 90 tablet, Rfl: 0 .  sertraline (ZOLOFT) 100 MG tablet, Take 1 tablet (100 mg total) by mouth daily after breakfast., Disp: 90 tablet, Rfl: 0   Medication Side Effects: none  Family Medical/ Social History: Changes? No, Radley had barely containable anxiety  after mother's death 10 years ago, then father and his dog died in the last year, since then having mostly depression. He continues alcohol and cigarettes despite episode of pancreatitis, refusing antidepressants for a while in case upset the pancreas estimating 6 beers weekly in past despite having been told by primary care that he had alcohol related pancreatitis. He is more dependent with work, brother, and doctors expecting a thyroid diagnosis and perfect medication to be found as dependent features must be structured into healthy displacements or resolutions of mourning to again lead a fulfilling daily life.   MENTAL HEALTH EXAM:  Height 5\' 8"  (1.727 m), weight 196 lb (88.9 kg).Body mass index is 29.8 kg/m.  Deferred as nonessential in coronavirus pandemic  General Appearance: Casual, Fairly Groomed and Guarded  Eye Contact:  Fair  Speech:  Garbled, Normal Rate and Talkative  Volume:  Normal  Mood:  Anxious, Depressed, Hopeless and Worthless  Affect:  Depressed, Labile and Anxious  Thought Process:  Goal Directed, Irrelevant and Linear  Orientation:  Full (Time, Place, and Person)  Thought Content: Ilusions, Obsessions, Paranoid Ideation and Rumination   Suicidal Thoughts:  Yes.  without intent/plan by phone 02/13/2019  Homicidal Thoughts:  No  Memory:  Immediate;   Good Remote;   Fair  Judgement:  Fair  Insight:  Fair and Lacking  Psychomotor Activity:   Normal, Increased, Decreased, Mannerisms, Psychomotor Retardation and Restlessness  Concentration:  Concentration: Fair and Attention Span: Fair  Recall:  [employer] of Knowledge: Good  Language: Fair  Assets:  Desire for Improvement Talents/Skills Vocational/Educational  ADL's:  Intact  Cognition: WNL  Prognosis:  Fair    DIAGNOSES:    ICD-10-CM   1. Moderate major depression (HCC)  F32.1 methylphenidate (RITALIN) 10 MG tablet    sertraline (ZOLOFT) 100 MG tablet  2. Panic disorder  F41.0 diazepam (VALIUM) 10 MG tablet    sertraline (ZOLOFT) 100 MG tablet  3. Mood disorder with depressive features due to general medical condition  F06.31 methylphenidate (RITALIN) 10 MG tablet    methylphenidate (RITALIN) 10 MG tablet  4. GAD (generalized anxiety disorder)  F41.1 diazepam (VALIUM) 10 MG tablet    sertraline (ZOLOFT) 100 MG tablet  5. Alcohol use disorder, moderate, in early remission ([place])  F10.21   6. Tobacco use disorder, moderate, dependence  F17.200     Receiving Psychotherapy: Yes EAP   RECOMMENDATIONS: Zoloft is increased to 100 mg nightly sent as #90 with no refill [facility] at [facility] for depression and anxiety.  Ritalin is continued 10 mg 3 times daily with meals sent as #90 each for July 1 and July 31 for mood disorder and chronic fatigue also associated with his medical condition of pancreatitis.  Valium is continued 10  mg twice daily as needed for anxiety sent as #60 with 1 [facility] for panic and generalized anxiety.  He returns for follow-up in 2 months allowing integration of therapy, medical management, and psychiatric expectation goals for self-sufficient happiness in place of illness related care by others.   Delight Hoh, MD

## 2019-05-02 ENCOUNTER — Ambulatory Visit (INDEPENDENT_AMBULATORY_CARE_PROVIDER_SITE_OTHER): Payer: 59 | Admitting: Psychiatry

## 2019-05-02 ENCOUNTER — Encounter: Payer: Self-pay | Admitting: Psychiatry

## 2019-05-02 ENCOUNTER — Other Ambulatory Visit: Payer: Self-pay

## 2019-05-02 VITALS — Ht 68.0 in | Wt 201.0 lb

## 2019-05-02 DIAGNOSIS — F411 Generalized anxiety disorder: Secondary | ICD-10-CM | POA: Diagnosis not present

## 2019-05-02 DIAGNOSIS — F1021 Alcohol dependence, in remission: Secondary | ICD-10-CM

## 2019-05-02 DIAGNOSIS — F330 Major depressive disorder, recurrent, mild: Secondary | ICD-10-CM

## 2019-05-02 DIAGNOSIS — F410 Panic disorder [episodic paroxysmal anxiety] without agoraphobia: Secondary | ICD-10-CM

## 2019-05-02 DIAGNOSIS — F0631 Mood disorder due to known physiological condition with depressive features: Secondary | ICD-10-CM | POA: Diagnosis not present

## 2019-05-02 DIAGNOSIS — F17200 Nicotine dependence, unspecified, uncomplicated: Secondary | ICD-10-CM

## 2019-05-02 MED ORDER — DOXEPIN HCL 50 MG PO CAPS: 50.0000 mg | ORAL_CAPSULE | Freq: Every day | ORAL | 1 refills | Status: DC

## 2019-05-02 MED ORDER — METHYLPHENIDATE HCL 10 MG PO TABS: 10.0000 mg | ORAL_TABLET | Freq: Three times a day (TID) | ORAL | 0 refills | Status: DC

## 2019-05-02 MED ORDER — DIAZEPAM 10 MG PO TABS: 10.0000 mg | ORAL_TABLET | Freq: Two times a day (BID) | ORAL | 1 refills | Status: DC | PRN

## 2019-05-02 NOTE — Progress Notes (Signed)
[facility]  Patient ID: Glenn [name],  MRN: [id number]  PCP: Marin Olp, MD  Date of Evaluation: 05/02/2019 Time spent:20 minutes from 1540 to 1600  Chief Complaint:  Chief Complaint    Depression; Anxiety; Panic Attack; Fatigue      HISTORY/CURRENT STATUS: Glenn [name] is seen onsite in office face-to-face individually with consent with epic collateral for psychiatric interview and exam in 40-month evaluation and management of panic and generalized anxiety, major depression and dysphoric fatigue associated with medical disorders, and alcohol and tobacco consequences.  As we review the clinical course of his interim symptoms, he is matter-of-fact secure and comfortable, but then he reports stopping Zoloft for insomnia despite having resolved his fear of medication rather than alcohol causing his pancreatitis of the past.  He then expects more medication in place of the Zoloft despite having defined that he is coping reasonably well currently until he describes having a dark spot off of the Zoloft especially for brother leaving, likely referring to death of mother and father previously. However, he also in the course of history clarifies he has many new current stressors. 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  The dog has bit him twice and does not do well at all being crated while the patient is away at work sometimes 12 to 14 hours.[name] has reduced alcohol and dip tobacco but has not stopped either.  He has an associated 5 pound weight gain looking more healthy. [place] registry documents last Ritalin fill as #90 tablets on 04/04/2019 and last Valium as #60 on 04/12/2019.  He has no mania, suicidality, psychosis, or delirium.  Depression        The patient presents withdepression as a recurrentproblem  with current episode starting more than 3 months ago. The onset quality is sudden. The problem occurs intermittently.The problem has been waxing and waningsince onset.Associated symptoms include decreased concentration,fatigue,helplessness,hopelessness,decreased interest,and sad ideas. Associated symptoms include does not have helplessness,hopelessness, insomnia, restlessness, appetite change, headaches, or suicidality.The symptoms are aggravated by work stress, medication, social issues and family issues.Past treatments include SSRIs - Selective serotonin reuptake inhibitors, TCAs - Tricyclic antidepressants, other medications and psychotherapy.Compliance with treatment is variable.Past compliance problems include medical issues, difficulty with treatment plan and medication issues.Previous treatment provided mildrelief.Risk factors include a change in medication usage/dosage, a recent illness, alcohol intake, family history, family history of mental illness, history of mental illness, major life event and stress. Past medical history includes chronic fatigue syndrome,anxiety,depressionand mental health disorder. Pertinent negatives include no life-threatening condition,no physical disability,no recent psychiatric admission,no bipolar disorder,no eating disorder,no obsessive-compulsive disorder,no post-traumatic stress disorder,no schizophrenia,no suicide attemptsand no head trauma.  Individual Medical History/ Review of Systems: Changes? :Yes   Allergies: Morphine and related  Current Medications:  Current Outpatient Medications:  .  b complex vitamins capsule, Take 1 capsule by mouth daily., Disp: , Rfl:  .  diazepam (VALIUM) 10 MG tablet, Take 1 tablet (10 mg total) by mouth 2 (two) times daily as needed for anxiety (panic)., Disp: 60 tablet, Rfl: 1 .  doxepin (SINEQUAN) 50 MG capsule, Take 1 capsule (50 mg total) by mouth at bedtime., Disp: 30  capsule, Rfl: 1 .  doxycycline (VIBRA-TABS) 100 MG tablet, Take 1 tablet (100 mg total) by mouth 2 (two) times daily., Disp: 20 tablet, Rfl:  0 .  losartan (COZAAR) 100 MG tablet, Take 1 tablet (100 mg total) by mouth daily., Disp: 90 tablet, Rfl: 1 .  methylphenidate (RITALIN) 10 MG tablet, Take 1 tablet (10 mg total) by mouth 3 (three) times daily with meals., Disp: 90 tablet, Rfl: 0 .  [START ON 06/01/2019] methylphenidate (RITALIN) 10 MG tablet, Take 1 tablet (10 mg total) by mouth 3 (three) times daily with meals., Disp: 90 tablet, Rfl: 0   Medication Side Effects: anxiety and insomnia  Family Medical/ Social History: Changes? Yes brother now planning to move out leaving patient alone after they have lost both mother and father over time.  MENTAL HEALTH EXAM:  Height 5\' 8"  (1.727 m), weight 201 lb (91.2 kg).Body mass index is 30.56 kg/m.  Others deferred for coronavirus stay at home  General Appearance: Casual, Fairly Groomed, Guarded and Obese  Eye Contact:  Fair  Speech:  Clear and Coherent, Garbled, Slow and Talkative and normal  Volume:  Normal to decreased  Mood:  Anxious, Depressed, Dysphoric, Irritable and Worthless  Affect:  Congruent, Depressed, Inappropriate, Labile and Anxious  Thought Process:  Coherent, Disorganized, Irrelevant and Descriptions of Associations: Tangential  Orientation:  Full (Time, Place, and Person)  Thought Content: Ilusions, Obsessions, Paranoid Ideation and Rumination   Suicidal Thoughts:  No  Homicidal Thoughts:  No  Memory:  Immediate;   Good Remote;   Good  Judgement:  Impaired  Insight:  Lacking  Psychomotor Activity:  Normal, Decreased and Mannerisms  Concentration:  Concentration: Fair and Attention Span: Fair  Recall:  [employer] of Knowledge: Fair  Language: Poor  Assets:  Leisure Time Resilience Talents/Skills  ADL's:  Intact  Cognition: WNL  Prognosis:  Fair    DIAGNOSES:    ICD-10-CM   1. Mild recurrent major depression (HCC)   F33.0 methylphenidate (RITALIN) 10 MG tablet    methylphenidate (RITALIN) 10 MG tablet    doxepin (SINEQUAN) 50 MG capsule  2. Panic disorder  F41.0 diazepam (VALIUM) 10 MG tablet    doxepin (SINEQUAN) 50 MG capsule  3. Mood disorder with depressive features due to general medical condition  F06.31 methylphenidate (RITALIN) 10 MG tablet    methylphenidate (RITALIN) 10 MG tablet    doxepin (SINEQUAN) 50 MG capsule  4. GAD (generalized anxiety disorder)  F41.1 diazepam (VALIUM) 10 MG tablet    doxepin (SINEQUAN) 50 MG capsule  5. Alcohol use disorder, moderate, in early remission ([place])  F10.21   6. Tobacco use disorder, moderate, dependence  F17.200     Receiving Psychotherapy: Yes  with EAP for counseling when willing   RECOMMENDATIONS: Over 50% of the time is spent in counseling and coordination of care as CBT for grief and loss, social skills, behavioral nutrition and anger management are integrated with course of medication adjustments for next symptom treatment matching.  He continues to understand warnings and risks of diagnoses and treatment including medication for prevention and monitoring and safety hygiene.  However, he has had no major medication problems but rather consequences of alcohol and dip.  As we review his many medications in the past for depression and anxiety, conclusion is shared to start doxepin 50 mg every bedtime sent as a month supply and 1 refill to [facility] for his panic and generalized anxiety and depressive disorders.  He has already discontinued Zoloft likely 4 to 6 weeks ago.  He is Escribed Ritalin 10 mg IR tablet 3 times daily sent as #90 each for September  3 and October 3 for depressive and fatigue symptoms to [facility].  He is Escribed diazepam 10 mg twice daily as needed for high anxiety and panic #60 with [facility].  He returns for follow-up in 2 months or sooner if needed.   Delight Hoh, MD

## 2019-05-26 ENCOUNTER — Other Ambulatory Visit: Payer: Self-pay | Admitting: Family Medicine

## 2019-07-01 ENCOUNTER — Telehealth: Payer: Self-pay | Admitting: Psychiatry

## 2019-07-01 ENCOUNTER — Other Ambulatory Visit: Payer: Self-pay

## 2019-07-01 DIAGNOSIS — F0631 Mood disorder due to known physiological condition with depressive features: Secondary | ICD-10-CM

## 2019-07-01 DIAGNOSIS — F330 Major depressive disorder, recurrent, mild: Secondary | ICD-10-CM

## 2019-07-01 MED ORDER — METHYLPHENIDATE HCL 10 MG PO TABS: 10.0000 mg | ORAL_TABLET | Freq: Three times a day (TID) | ORAL | 0 refills | Status: DC

## 2019-07-01 NOTE — Telephone Encounter (Signed)
Patient called and said that he needs a refill on his ritalin 10 mg to [facility] the [place] and [address]

## 2019-07-01 NOTE — Telephone Encounter (Signed)
Last refill 06/01/2019 has appt 07/04/2019 Pended for approval

## 2019-07-01 NOTE — Telephone Encounter (Signed)
Medically necessary Ritalin sent to [facility] no contraindication.

## 2019-07-04 ENCOUNTER — Ambulatory Visit: Payer: 59 | Admitting: Psychiatry

## 2019-07-18 ENCOUNTER — Other Ambulatory Visit: Payer: Self-pay

## 2019-07-18 ENCOUNTER — Ambulatory Visit (INDEPENDENT_AMBULATORY_CARE_PROVIDER_SITE_OTHER): Payer: 59 | Admitting: Psychiatry

## 2019-07-18 ENCOUNTER — Encounter: Payer: Self-pay | Admitting: Psychiatry

## 2019-07-18 VITALS — Ht 68.0 in | Wt 208.0 lb

## 2019-07-18 DIAGNOSIS — F0631 Mood disorder due to known physiological condition with depressive features: Secondary | ICD-10-CM

## 2019-07-18 DIAGNOSIS — F17200 Nicotine dependence, unspecified, uncomplicated: Secondary | ICD-10-CM | POA: Diagnosis not present

## 2019-07-18 DIAGNOSIS — F410 Panic disorder [episodic paroxysmal anxiety] without agoraphobia: Secondary | ICD-10-CM

## 2019-07-18 DIAGNOSIS — F330 Major depressive disorder, recurrent, mild: Secondary | ICD-10-CM

## 2019-07-18 DIAGNOSIS — F1021 Alcohol dependence, in remission: Secondary | ICD-10-CM

## 2019-07-18 DIAGNOSIS — F411 Generalized anxiety disorder: Secondary | ICD-10-CM

## 2019-07-18 MED ORDER — METHYLPHENIDATE HCL 10 MG PO TABS: 10.0000 mg | ORAL_TABLET | Freq: Three times a day (TID) | ORAL | 0 refills | Status: DC

## 2019-07-18 MED ORDER — DIAZEPAM 10 MG PO TABS: 10.0000 mg | ORAL_TABLET | Freq: Two times a day (BID) | ORAL | 1 refills | Status: DC | PRN

## 2019-07-18 MED ORDER — FLUVOXAMINE MALEATE 100 MG PO TABS: 200.0000 mg | ORAL_TABLET | Freq: Every day | ORAL | 1 refills | Status: DC

## 2019-07-18 MED ORDER — TRAZODONE HCL 100 MG PO TABS: 100.0000 mg | ORAL_TABLET | Freq: Every evening | ORAL | 1 refills | Status: DC | PRN

## 2019-07-18 NOTE — Progress Notes (Signed)
[facility]  Patient ID: Glenn [name],  MRN: [id number]  PCP: Marin Olp, MD  Date of Evaluation: 07/18/2019 Time spent:20 minutes from 1600 to 1620  Chief Complaint:  Chief Complaint    Anxiety; Panic Attack; Depression; Fatigue      HISTORY/CURRENT STATUS: Glenn [name] is seen onsite in office 20 minutes face-to-face individually with consent with epic collateral for psychiatric interview and exam in 87-month evaluation and management of double depression recurrent major and chronic depressive fatigue associated with general medical condition including pancreatitis, panic and generalized anxiety, and alcohol and cannabis use disorders.  Glenn [name] is more competent, competent, and communicative today as he seems to be resolving that younger brother is moving out even now to a neighborhood around here to start his own life, while  patient is still missing both deceased parents being the oldest of the surviving siblings.  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  His job assignment at work has changed from [place] to [employer] with job security.  He maturely requests to restart Luvox today as the best medication for his anxiety through the past extensive treatment, most recently stopping his doxepin 50 mg not tolerating that at bedtime. He reports cutting back on dip tobacco and beer, though he has gained another 7 pounds after a 5 pound gain last time possibly indicating more resolution of the pancreatic process but also placing him at other metabolic risk.  [place] registry documents last Ritalin on November 2 along with Valium dispensing that day as well.  He has no mania, suicidality, psychosis, or delirium.  Depression The patient presents withdepressionasa recurrentproblemwithcurrent episode startingmore than 5 months ago. The onset quality is sudden. The  problem occurs intermittently.The problem has been  gradually improving now after waxing and waningsince onset.Associated symptoms include decreased concentration,fatigue,helplessness,and sad. Associated symptoms include does not have helplessness,hopelessness,decreased interest, insomnia,restlessness, appetite change, headaches, or suicidality.The symptoms are aggravated by work stress, medication, social issues and family issues.Past treatments include SSRIs - Selective serotonin reuptake inhibitors, TCAs - Tricyclic antidepressants, other medications and psychotherapy.Compliance with treatment is variable.Past compliance problems include medical issues, difficulty with treatment plan and medication issues.Previous treatment provided mildrelief.Risk factors include a change in medication usage/dosage, a recent illness, alcohol intake, family history, family history of mental illness, history of mental illness, major life event and stress. Past medical history includes chronic fatigue syndrome,anxiety,depressionand mental health disorder. Pertinent negatives include no life-threatening condition,no physical disability,no recent psychiatric admission,no bipolar disorder,no eating disorder,no obsessive-compulsive disorder,no post-traumatic stress disorder,no schizophrenia,no suicide attemptsand no head trauma  Individual Medical History/ Review of Systems: Changes? :No   Allergies: Morphine and related  Current Medications:  Current Outpatient Medications:  .  b complex vitamins capsule, Take 1 capsule by mouth daily., Disp: , Rfl:  .  diazepam (VALIUM) 10 MG tablet, Take 1 tablet (10 mg total) by mouth 2 (two) times daily as needed for anxiety (panic)., Disp: 60 tablet, Rfl: 1 .  doxycycline (VIBRA-TABS) 100 MG tablet, Take 1 tablet (100 mg total) by mouth 2 (two) times daily., Disp: 20 tablet, Rfl: 0 .  fluvoxaMINE (LUVOX) 100 MG tablet, Take 2 tablets (200  mg total) by mouth at bedtime., Disp: 60 tablet, Rfl: 1 .  losartan (COZAAR) 100 MG tablet, TAKE 1 TABLET(100 MG) BY MOUTH DAILY, Disp: 90 tablet, Rfl: 1 .  [START ON 07/30/2019] methylphenidate (RITALIN) 10 MG  tablet, Take 1 tablet (10 mg total) by mouth 3 (three) times daily with meals., Disp: 90 tablet, Rfl: 0 .  [START ON 08/29/2019] methylphenidate (RITALIN) 10 MG tablet, Take 1 tablet (10 mg total) by mouth 3 (three) times daily with meals., Disp: 90 tablet, Rfl: 0 .  traZODone (DESYREL) 100 MG tablet, Take 1 tablet (100 mg total) by mouth at bedtime as needed for sleep., Disp: 30 tablet, Rfl: 1   Medication Side Effects: none although he stopped doxepin in the interim for drowsy dissonance  Family Medical/ Social History: Changes? No  MENTAL HEALTH EXAM:  Height 5\' 8"  (1.727 m), weight 208 lb (94.3 kg).Body mass index is 31.63 kg/m. Muscle strengths and tone 5/5, postural reflexes and gait 0/0, and AIMS = 0 otherwise deferred for coronavirus shutdown  General Appearance: Casual, Disheveled, Guarded, Meticulous and Obese  Eye Contact:  Fair  Speech:  Clear and Coherent, Garbled, Normal Rate and Talkative  Volume:  Normal  Mood:  Anxious, Dysphoric, Euthymic, Irritable and Worthless  Affect:  Congruent, Depressed, Inappropriate, Full Range and Anxious  Thought Process:  Coherent, Goal Directed, Irrelevant and Descriptions of Associations: Tangential  Orientation:  Full (Time, Place, and Person)  Thought Content: Ilusions, Rumination and Tangential   Suicidal Thoughts:  No  Homicidal Thoughts:  No  Memory:  Immediate;   Fair Remote;   Good  Judgement:  Fair  Insight:  Fair  Psychomotor Activity:  Normal, Decreased and Mannerisms  Concentration:  Concentration: Fair and Attention Span: Good  Recall:  Good  Fund of Knowledge: Good  Language: Fair  Assets:  Leisure Time Resilience Talents/Skills  ADL's:  Intact  Cognition: WNL  Prognosis:  Fair    DIAGNOSES:    ICD-10-CM    1. Mild recurrent major depression (HCC)  F33.0 methylphenidate (RITALIN) 10 MG tablet    methylphenidate (RITALIN) 10 MG tablet  2. Panic disorder  F41.0 diazepam (VALIUM) 10 MG tablet  3. Mood disorder with depressive features due to general medical condition  F06.31 methylphenidate (RITALIN) 10 MG tablet    methylphenidate (RITALIN) 10 MG tablet  4. Tobacco use disorder, moderate, dependence  F17.200   5. GAD (generalized anxiety disorder)  F41.1 diazepam (VALIUM) 10 MG tablet  6. Alcohol use disorder, moderate, in early remission ([place])  F10.21     Receiving Psychotherapy: Yes , with EAP for counseling when willing   RECOMMENDATIONS: Psychosupportive psychoeducation updates prevention and monitoring and safety hygiene regarding all medications as patient tends to stop his medication in the interim though he seems to be using less Valium and Ritalin overall.  He request to restart Luvox as his best antianxiety also antidepressant medication in the past.  He is E scribed Luvox 100 mg tablet to titrate over 6 days up to 2 tablets total 200 mg every bedtime sent as #60 with [facility] [address] for panic and generalized anxiety as well as double depression with doxepin already discontinued.  He is E scribed trazodone 100 mg at bedtime as needed for insomnia #30 with 1 refill sent to [address] for insomnia of anxiety and depression.  He is E scribed Ritalin 10 mg 3 times daily as #90 each for December 1 in December 31 for chronic depressive fatigue associated with his general medical status and major depression sent to [facility] on [address].  He is E scribed Valium 10 mg twice daily as needed for panic and high anxiety #60 with [facility] [address].  He returns for follow-up in 2 months   Delight Hoh, MD

## 2019-07-29 ENCOUNTER — Other Ambulatory Visit: Payer: Self-pay

## 2019-07-29 DIAGNOSIS — Z20822 Contact with and (suspected) exposure to covid-19: Secondary | ICD-10-CM

## 2019-07-30 LAB — NOVEL CORONAVIRUS, NAA: SARS-CoV-2, NAA: NOT DETECTED

## 2019-09-19 ENCOUNTER — Ambulatory Visit: Payer: 59 | Admitting: Psychiatry

## 2019-09-23 ENCOUNTER — Other Ambulatory Visit: Payer: Self-pay | Admitting: Psychiatry

## 2019-10-08 ENCOUNTER — Other Ambulatory Visit: Payer: Self-pay | Admitting: Psychiatry

## 2019-10-17 ENCOUNTER — Ambulatory Visit: Payer: 59 | Admitting: Psychiatry

## 2019-10-24 ENCOUNTER — Ambulatory Visit (INDEPENDENT_AMBULATORY_CARE_PROVIDER_SITE_OTHER): Payer: 59 | Admitting: Psychiatry

## 2019-10-24 ENCOUNTER — Encounter: Payer: Self-pay | Admitting: Psychiatry

## 2019-10-24 ENCOUNTER — Other Ambulatory Visit: Payer: Self-pay

## 2019-10-24 VITALS — Ht 68.0 in | Wt 220.0 lb

## 2019-10-24 DIAGNOSIS — F410 Panic disorder [episodic paroxysmal anxiety] without agoraphobia: Secondary | ICD-10-CM | POA: Diagnosis not present

## 2019-10-24 DIAGNOSIS — F330 Major depressive disorder, recurrent, mild: Secondary | ICD-10-CM | POA: Diagnosis not present

## 2019-10-24 DIAGNOSIS — F0631 Mood disorder due to known physiological condition with depressive features: Secondary | ICD-10-CM

## 2019-10-24 DIAGNOSIS — F411 Generalized anxiety disorder: Secondary | ICD-10-CM | POA: Diagnosis not present

## 2019-10-24 MED ORDER — FLUVOXAMINE MALEATE 100 MG PO TABS: 100.0000 mg | ORAL_TABLET | Freq: Every day | ORAL | 1 refills | Status: DC

## 2019-10-24 MED ORDER — DIAZEPAM 10 MG PO TABS: 10.0000 mg | ORAL_TABLET | Freq: Two times a day (BID) | ORAL | 2 refills | Status: DC | PRN

## 2019-10-24 MED ORDER — METHYLPHENIDATE HCL 10 MG PO TABS: 10.0000 mg | ORAL_TABLET | Freq: Three times a day (TID) | ORAL | 0 refills | Status: DC

## 2019-10-24 MED ORDER — TRAZODONE HCL 100 MG PO TABS: 100.0000 mg | ORAL_TABLET | Freq: Every day | ORAL | 2 refills | Status: DC

## 2019-10-24 NOTE — Progress Notes (Signed)
[facility]  Patient ID: Glenn [name],  MRN: [id number]  PCP: Marin Olp, MD  Date of Evaluation: 10/24/2019 Time spent:20 minutes from 1540 to 1600  Chief Complaint:  Chief Complaint    Panic Attack; Anxiety; Depression; Fatigue      HISTORY/CURRENT STATUS: Shakeer is seen onsite in office 20 minutes face-to-face individually with consent with epic collateral for psychiatric interview and exam in 70-month evaluation and management being 1 month overdue for panic and generalized anxiety, major depression, mood disorder associated with pancreatitis and general health deterioration now improving, and alcohol and tobacco use disorders improving.  The patient has independently stabilized his pattern to become compliant with care, refusing further psychotherapy available at his EAP and limiting his restart of Luvox to 100 mg as a single tablet every bedtime from last appointment rather than advancing to the 200 mg planned.  He does continue Ritalin and Valium with  registry documenting last Ritalin 10 mg IR as 08/19/2019 and last Valium 10 mg twice daily as needed 09/17/2019.  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  He has regained some weight of 12 pounds and has no pancreatitis problems, now able to work out in the gym again.  His brother is living independently in [place] near here, and the patient lives alone seeming more at peace with the loss of his parents.  His dog has allergies so that he is spending much money at the vet on dermatitis and other symptoms.  He requires less maintenance treatment now as to frequency and amount by his own self direction not tolerating doing this in collaboration due to loss and control conflicts.  He has no mania, suicidality, psychosis or delirium.   Depression The patient presents withdepressionasa recurrentproblemwithcurrent episode startingmore than  29monthsago. The onset quality is sudden. The problem occurs intermittently.The problem has been gradually improving now after waxing and waningfrom onset.Associated symptoms include decreased concentration,fatigue, appetite change gaining 12 pounds,andsad. Associated symptoms include no helplessness,hopelessness,decreased interest,insomnia,restlessness,headaches, orsuicidality.The symptoms are aggravated by work stress, medication, social issues and family issues.Past treatments include SSRIs - Selective serotonin reuptake inhibitors, TCAs - Tricyclic antidepressants, other medications and psychotherapy.Compliance with treatment is variable.Past compliance problems include medical issues, difficulty with treatment plan and medication issues.Previous treatment provided mildrelief.Risk factors include a change in medication usage/dosage, a recent illness, alcohol intake, family history, family history of mental illness, history of mental illness, major life event and stress. Past medical history includes chronic fatigue syndrome,anxiety,depressionand mental health disorder. Pertinent negatives include no life-threatening condition,no physical disability,no recent psychiatric admission,no bipolar disorder,no eating disorder,no obsessive-compulsive disorder,no post-traumatic stress disorder,no schizophrenia,no suicide attemptsand no head trauma.  Individual Medical History/ Review of Systems: Changes? :Yes Weight is back to that of 18 months ago, and he may still be taking his losartan for blood pressure.  Allergies: Morphine and related  Current Medications:  Current Outpatient Medications:  .  b complex vitamins capsule, Take 1 capsule by mouth daily., Disp: , Rfl:  .  diazepam (VALIUM) 10 MG tablet, Take 1 tablet (10 mg total) by mouth 2 (two) times daily as needed for anxiety (panic)., Disp: 60 tablet, Rfl: 1 .  doxycycline (VIBRA-TABS) 100 MG tablet,  Take 1 tablet (100 mg total) by mouth 2 (two) times daily., Disp: 20 tablet, Rfl: 0 .  fluvoxaMINE (LUVOX) 100 MG tablet, TAKE 2 TABLETS(200 MG) BY MOUTH AT BEDTIME, Disp: 60 tablet, Rfl: 0 .  losartan (COZAAR) 100  MG tablet, TAKE 1 TABLET(100 MG) BY MOUTH DAILY, Disp: 90 tablet, Rfl: 1 .  methylphenidate (RITALIN) 10 MG tablet, Take 1 tablet (10 mg total) by mouth 3 (three) times daily with meals., Disp: 90 tablet, Rfl: 0 .  methylphenidate (RITALIN) 10 MG tablet, Take 1 tablet (10 mg total) by mouth 3 (three) times daily with meals., Disp: 90 tablet, Rfl: 0 .  traZODone (DESYREL) 100 MG tablet, TAKE 1 TABLET(100 MG) BY MOUTH AT BEDTIME AS NEEDED FOR SLEEP, Disp: 30 tablet, Rfl: 0  Medication Side Effects: none  Family Medical/ Social History: Changes? No  MENTAL HEALTH EXAM:  Height 5\' 8"  (1.727 m), weight 220 lb (99.8 kg).Body mass index is 33.45 kg/m. Muscle strengths and tone 5/5, postural reflexes and gait 0/0, and AIMS = 0 otherwise deferred for coronavirus shutdown  General Appearance: Casual, Fairly Groomed, Guarded and Obese  Eye Contact:  Fair  Speech:  Clear and Coherent, Normal Rate and Talkative  Volume:  Normal  Mood:  Anxious, Depressed, Dysphoric and Euthymic  Affect:  Congruent, Depressed, Inappropriate, Full Range and Anxious  Thought Process:  Coherent, Goal Directed, Irrelevant, Linear and Descriptions of Associations: Tangential  Orientation:  Full (Time, Place, and Person)  Thought Content: Rumination and Tangential   Suicidal Thoughts:  No  Homicidal Thoughts:  No  Memory:  Immediate;   Good and Fair Remote;   Good and Fair  Judgement:  Fair  Insight:  Fair  Psychomotor Activity:  Normal, Decreased and Mannerisms  Concentration:  Concentration: Fair and Attention Span: Good  Recall:  [employer] of Knowledge: Good  Language: Fair  Assets:  Leisure Time Resilience Talents/Skills Vocational/Educational  ADL's:  Intact  Cognition: WNL  Prognosis:  Fair     DIAGNOSES:    ICD-10-CM   1. Panic disorder  F41.0   2. GAD (generalized anxiety disorder)  F41.1   3. Mild recurrent major depression ([place])  F33.0   4. Mood disorder with depressive features due to general medical condition  F06.31     Receiving Psychotherapy: No    RECOMMENDATIONS: Psychosupportive psychoeducation reviews and reworks clinical course for mental and medical health to clarify symptoms responding with improvement over time for symptom treatment matching for adaptive skills as well as medications.  He continues Luvox 100 mg every bedtime sent as #30 with 1 refill having current supply as he never advanced the dose to 2 of the 100 mg tablets nightly for anxiety and depression sent prescription to [place].  He is E scribed trazodone 100 mg every bedtime as #30 with 2 refills to [facility] for insomnia associated with anxiety and depression.  He is E scribed Valium 10 mg twice daily as needed for insomnia and anxiety as #60 with 2 refills sent to [facility] for panic and generalized anxiety disorders.  He is E scribed Ritalin 10 mg IR tablet 3 times daily #90 each for fatigue and negative despair of depression associated with medical disorders.  He returns for follow-up in 3 months or sooner if needed as he is working out in [employer] again and meeting his job expectations completely.  Delight Hoh, MD

## 2019-11-20 ENCOUNTER — Encounter: Payer: Self-pay | Admitting: Family Medicine

## 2019-12-11 ENCOUNTER — Encounter: Payer: Self-pay | Admitting: Family Medicine

## 2020-01-08 ENCOUNTER — Other Ambulatory Visit: Payer: Self-pay | Admitting: Psychiatry

## 2020-01-08 DIAGNOSIS — F330 Major depressive disorder, recurrent, mild: Secondary | ICD-10-CM

## 2020-01-08 DIAGNOSIS — F410 Panic disorder [episodic paroxysmal anxiety] without agoraphobia: Secondary | ICD-10-CM

## 2020-01-08 DIAGNOSIS — F0631 Mood disorder due to known physiological condition with depressive features: Secondary | ICD-10-CM

## 2020-01-08 DIAGNOSIS — F411 Generalized anxiety disorder: Secondary | ICD-10-CM

## 2020-01-10 ENCOUNTER — Other Ambulatory Visit: Payer: Self-pay | Admitting: Family Medicine

## 2020-01-23 ENCOUNTER — Other Ambulatory Visit: Payer: Self-pay

## 2020-01-23 ENCOUNTER — Ambulatory Visit (INDEPENDENT_AMBULATORY_CARE_PROVIDER_SITE_OTHER): Payer: 59 | Admitting: Psychiatry

## 2020-01-23 ENCOUNTER — Telehealth: Payer: Self-pay | Admitting: Psychiatry

## 2020-01-23 ENCOUNTER — Encounter: Payer: Self-pay | Admitting: Psychiatry

## 2020-01-23 VITALS — Ht 68.0 in | Wt 218.0 lb

## 2020-01-23 DIAGNOSIS — F411 Generalized anxiety disorder: Secondary | ICD-10-CM | POA: Diagnosis not present

## 2020-01-23 DIAGNOSIS — F3341 Major depressive disorder, recurrent, in partial remission: Secondary | ICD-10-CM

## 2020-01-23 DIAGNOSIS — F0631 Mood disorder due to known physiological condition with depressive features: Secondary | ICD-10-CM

## 2020-01-23 DIAGNOSIS — F410 Panic disorder [episodic paroxysmal anxiety] without agoraphobia: Secondary | ICD-10-CM

## 2020-01-23 DIAGNOSIS — F330 Major depressive disorder, recurrent, mild: Secondary | ICD-10-CM

## 2020-01-23 MED ORDER — METHYLPHENIDATE HCL 10 MG PO TABS: 10.0000 mg | ORAL_TABLET | Freq: Three times a day (TID) | ORAL | 0 refills | Status: DC

## 2020-01-23 NOTE — Addendum Note (Signed)
Addended by: Delight Hoh on: 01/23/2020 03:37 PM   Modules accepted: Orders

## 2020-01-23 NOTE — Progress Notes (Signed)
[facility]  Patient ID: MUNACHIMSO ROMACK,  MRN: [id number]  PCP: Marin Olp, MD  Date of Evaluation: 01/23/2020 Time spent:20 minutes from 1530 to 1550  Chief Complaint:  Chief Complaint    Panic Attack; Anxiety; Depression      HISTORY/CURRENT STATUS: Maynard is seen onsite in office 20 minutes face-to-face individually with consent with epic collateral for psychiatric interview and exam in 36-month evaluation and management of major and associated with medical disorder double depression, panic and generalized anxiety, and alcohol use disorder now controlled social drinking likely still some risk to his history of pancreatitis.  Many trials for depression and panic have been unsuccessful until now Valium and Ritalin augmentation of Luvox and infrequent as needed trazodone are approaching 6 months of significant remission, though he still has significant fibromyalgia symptoms he attributes to inheritance from mother.  However, he does not miss work any longer and he has plans to pick a [place] friend up in his new truck with new surf boards to spend the holiday weekend at [employer].  He will not define amount of alcohol but suggests he is drinking after the hot days on the job.  Brother is getting married will keep the patient's dog this weekend as the patient tries to devise a way to have his router replaced by technician without the dog destroying things.  Patient requested next Ritalin medication from the office early today to pick it up at pharmacy  before his appointment for better departure time for the beach but with Epic and [employer] crashing, escription not sent until he arrived at the office but did not have to print the prescription for his delivery.  He has no mania, suicidality, psychosis or delirium.   Depression The patient presents withdepressionasa recurrentproblemwithcurrent episode startingmore than 25monthsago. The onset quality is sudden. The  problem occurs intermittently.The problem has sustained improvement now after waxing and waningfrom onset.Associated symptoms include decreased concentration,fatigue,chronic pain, prepanic, and reactive sadness. Associated symptoms include no helplessness,hopelessness,decreased interest, stress at work, insomnia,restlessness,appetite change, headaches, orsuicidality.The symptoms are aggravated by work stress, medication, social issues and family issues.Past treatments include SSRIs - Selective serotonin reuptake inhibitors, TCAs - Tricyclic antidepressants, other medications and psychotherapy.Compliance with treatment is variable.Past compliance problems include medical issues, difficulty with treatment plan and medication issues.Previous treatment provided mildrelief.Risk factors include a change in medication usage/dosage, a recent illness, alcohol intake, family history, family history of mental illness, history of mental illness, major life event and stress. Past medical history includes chronic fatigue syndrome,anxiety,depressionand mental health disorder. Pertinent negatives include no life-threatening condition,no physical disability,no recent psychiatric admission,no bipolar disorder,no eating disorder,no obsessive-compulsive disorder,no post-traumatic stress disorder,no schizophrenia,no suicide attemptsand no head trauma.  Individual Medical History/ Review of Systems: Changes? :Yes   Allergies: Morphine and related  Current Medications:  Current Outpatient Medications:  .  b complex vitamins capsule, Take 1 capsule by mouth daily., Disp: , Rfl:  .  diazepam (VALIUM) 10 MG tablet, Take 1 tablet (10 mg total) by mouth 2 (two) times daily as needed for anxiety (panic)., Disp: 60 tablet, Rfl: 2 .  doxycycline (VIBRA-TABS) 100 MG tablet, Take 1 tablet (100 mg total) by mouth 2 (two) times daily., Disp: 20 tablet, Rfl: 0 .  fluvoxaMINE (LUVOX) 100 MG  tablet, TAKE 1 TABLET(100 MG) BY MOUTH AT BEDTIME, Disp: 30 tablet, Rfl: 2 .  losartan (COZAAR) 100 MG tablet, TAKE 1 TABLET(100 MG) BY MOUTH DAILY, Disp: 90 tablet, Rfl: 1 .  methylphenidate (RITALIN) 10  MG tablet, Take 1 tablet (10 mg total) by mouth 3 (three) times daily with meals., Disp: 90 tablet, Rfl: 0 .  [START ON 02/22/2020] methylphenidate (RITALIN) 10 MG tablet, Take 1 tablet (10 mg total) by mouth 3 (three) times daily with meals., Disp: 90 tablet, Rfl: 0 .  [START ON 03/23/2020] methylphenidate (RITALIN) 10 MG tablet, Take 1 tablet (10 mg total) by mouth 3 (three) times daily with meals., Disp: 90 tablet, Rfl: 0 .  traZODone (DESYREL) 100 MG tablet, Take 1 tablet (100 mg total) by mouth at bedtime., Disp: 30 tablet, Rfl: 2 Medication Side Effects: none  Family Medical/ Social History: Changes? No  MENTAL HEALTH EXAM:  Height 5\' 8"  (1.727 m), weight 218 lb (98.9 kg).Body mass index is 33.15 kg/m. Muscle strengths and tone 5/5, postural reflexes and gait 0/0, and AIMS = 0.  General Appearance: Casual, Fairly Groomed, Meticulous and Obese  Eye Contact:  Fair  Speech:  Clear and Coherent, Garbled, Normal Rate and Talkative  Volume:  Normal  Mood:  Anxious, Euthymic and Worthless  Affect:  Congruent, Labile, Full Range and Anxious  Thought Process:  Coherent, Goal Directed, Irrelevant and Descriptions of Associations: Tangential  Orientation:  Full (Time, Place, and Person)  Thought Content: Ilusions, Rumination and Tangential   Suicidal Thoughts:  No  Homicidal Thoughts:  No  Memory:  Immediate;   Fair Remote;   Good  Judgement:  Fair  Insight:  Fair  Psychomotor Activity:  Normal and Mannerisms  Concentration:  Concentration: Fair and Attention Span: Good  Recall:  Good  Fund of Knowledge: Good  Language: Fair  Assets:  [employer]ire for Improvement Leisure Time Social Support Talents/Skills  ADL's:  Intact  Cognition: WNL  Prognosis:  Fair    DIAGNOSES:    ICD-10-CM    1. Panic disorder  F41.0 fluvoxaMINE (LUVOX) 100 MG tablet    diazepam (VALIUM) 10 MG tablet  2. Mood disorder with depressive features due to general medical condition  F06.31 fluvoxaMINE (LUVOX) 100 MG tablet    methylphenidate (RITALIN) 10 MG tablet    methylphenidate (RITALIN) 10 MG tablet  3. GAD (generalized anxiety disorder)  F41.1 fluvoxaMINE (LUVOX) 100 MG tablet    diazepam (VALIUM) 10 MG tablet  4. Recurrent major depression in partial remission ([facility])  F33.41 methylphenidate (RITALIN) 10 MG tablet  5. Mild recurrent major depression ([facility])  F33.0 fluvoxaMINE (LUVOX) 100 MG tablet    methylphenidate (RITALIN) 10 MG tablet    Receiving Psychotherapy: No    RECOMMENDATIONS: Psychosupportive psychoeducation integrates closure for death of both parents, coping for the fibromyalgia type pain, and mental and medical collaborate for prevention of pancreatitis.  Howeve,r current medications may be optimal for management of current symptom complex.  He has current supply of trazodone 100 mg at bedtime if needed for insomnia needed much less often thus having supply supply.  He is E scribed Ritalin 10 mg IR 3 times daily #90 at the beginning of his appointment date with [place] registry documenting last Ritalin dispensing 12/23/2019.  Additionally subsequently after appointment, Ritalin subsequent eScription's are sent #90 each for June 26 and March 23, 2020 to [facility] for ADHD.  He is E scribed Valium 10 mg twice daily as needed for panic or generalized anxiety or OCD fixations as #60 with 2 refills to [facility].  He returns for follow-up in 3 months or sooner if needed limiting alcohol and tobacco a great deal if not completely.   Sheilah Mins  Creig Hines, MD

## 2020-01-23 NOTE — Telephone Encounter (Signed)
PT has appt today at 3:40pm and plans to come. He is needing to rush to leave town after that and wants to try to get his Methylphenidate refill earlier today so he can be off. If ok, please send to [facility], [address]

## 2020-01-23 NOTE — Telephone Encounter (Signed)
Ritalin 10 mg IR 3 times daily #90 sent to [facility] immediately upon start of appointment as epic had been down.

## 2020-01-24 MED ORDER — METHYLPHENIDATE HCL 10 MG PO TABS: 10.0000 mg | ORAL_TABLET | Freq: Three times a day (TID) | ORAL | 0 refills | Status: DC

## 2020-01-24 MED ORDER — DIAZEPAM 10 MG PO TABS: 10.0000 mg | ORAL_TABLET | Freq: Two times a day (BID) | ORAL | 2 refills | Status: DC | PRN

## 2020-01-24 MED ORDER — FLUVOXAMINE MALEATE 100 MG PO TABS: ORAL_TABLET | ORAL | 2 refills | Status: DC

## 2020-02-04 ENCOUNTER — Other Ambulatory Visit: Payer: Self-pay | Admitting: Psychiatry

## 2020-02-04 DIAGNOSIS — F410 Panic disorder [episodic paroxysmal anxiety] without agoraphobia: Secondary | ICD-10-CM

## 2020-02-04 DIAGNOSIS — F411 Generalized anxiety disorder: Secondary | ICD-10-CM

## 2020-02-04 DIAGNOSIS — F330 Major depressive disorder, recurrent, mild: Secondary | ICD-10-CM

## 2020-04-23 ENCOUNTER — Ambulatory Visit: Payer: 59 | Admitting: Adult Health

## 2020-04-30 ENCOUNTER — Telehealth: Payer: Self-pay | Admitting: Psychiatry

## 2020-04-30 ENCOUNTER — Other Ambulatory Visit: Payer: Self-pay

## 2020-04-30 DIAGNOSIS — F330 Major depressive disorder, recurrent, mild: Secondary | ICD-10-CM

## 2020-04-30 DIAGNOSIS — F0631 Mood disorder due to known physiological condition with depressive features: Secondary | ICD-10-CM

## 2020-04-30 MED ORDER — METHYLPHENIDATE HCL 10 MG PO TABS: 10.0000 mg | ORAL_TABLET | Freq: Three times a day (TID) | ORAL | 0 refills | Status: DC

## 2020-04-30 NOTE — Telephone Encounter (Signed)
Pt is requesting a refill on generic Ritalin. Has an appt on 9/9 with Dr. Creig Hines. Call to [facility] on [address] in [place].

## 2020-04-30 NOTE — Telephone Encounter (Signed)
All 3 dispensing from eScription's of 01/24/2020 are completed needing next medically necessary Ritalin sent to [facility] [address] with no contraindication in the last year per [place] registry or epic for interim to next appointment in September

## 2020-04-30 NOTE — Telephone Encounter (Signed)
Last refill 03/26/20 Pended for Dr. Creig Hines to review and send

## 2020-05-07 ENCOUNTER — Ambulatory Visit (INDEPENDENT_AMBULATORY_CARE_PROVIDER_SITE_OTHER): Payer: 59 | Admitting: Psychiatry

## 2020-05-07 ENCOUNTER — Encounter: Payer: Self-pay | Admitting: Psychiatry

## 2020-05-07 ENCOUNTER — Other Ambulatory Visit: Payer: Self-pay

## 2020-05-07 VITALS — Ht 68.0 in | Wt 206.0 lb

## 2020-05-07 DIAGNOSIS — F410 Panic disorder [episodic paroxysmal anxiety] without agoraphobia: Secondary | ICD-10-CM | POA: Diagnosis not present

## 2020-05-07 DIAGNOSIS — F3341 Major depressive disorder, recurrent, in partial remission: Secondary | ICD-10-CM

## 2020-05-07 DIAGNOSIS — F0631 Mood disorder due to known physiological condition with depressive features: Secondary | ICD-10-CM

## 2020-05-07 DIAGNOSIS — F411 Generalized anxiety disorder: Secondary | ICD-10-CM

## 2020-05-07 MED ORDER — METHYLPHENIDATE HCL 10 MG PO TABS: 10.0000 mg | ORAL_TABLET | Freq: Three times a day (TID) | ORAL | 0 refills | Status: DC

## 2020-05-07 MED ORDER — TRAZODONE HCL 100 MG PO TABS: ORAL_TABLET | ORAL | 5 refills | Status: DC

## 2020-05-07 MED ORDER — FLUVOXAMINE MALEATE 100 MG PO TABS: ORAL_TABLET | ORAL | 5 refills | Status: DC

## 2020-05-07 MED ORDER — DIAZEPAM 10 MG PO TABS: 10.0000 mg | ORAL_TABLET | Freq: Two times a day (BID) | ORAL | 5 refills | Status: DC | PRN

## 2020-05-07 NOTE — Progress Notes (Signed)
[facility]  Patient ID: Glenn [name],  MRN: [id number]  PCP: Marin Olp, MD  Date of Evaluation: 05/07/2020 Time spent:20 minutes from 1030 to 1050  Chief Complaint:  Chief Complaint    Panic Attack; Depression; Anxiety      HISTORY/CURRENT STATUS: Glenn [name] is seen Onsite in office 20 minutes face-to-face individually with consent with epic collateral for psychiatric interview and exam in 3.108-month evaluation and management of panic and generalized anxiety disorders and double depression major and associated with general medical condition.  Glenn [name] has in the last year stabilized his multidetermined depressive and anxiety burden seemingly most by overcoming the loss of mother then father to accept being the patriarch in the family himself.  He went to younger brother's home nearby this office today before his appointment to help correct the growth of various grasses in the yard about which Glenn [name] has become an expert in his job.  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  Glenn [name] therefore still works out with Glenn [name] in his garage at home finding the heat and humidity oppressive in these last days of summer.  He wisely stopped beer and tobacco use fully with another episode of stomach pain 2 months ago not becoming episode of pancreatitis though he did not attend the doctor, though he has overall improved his general medical health with chronic fatigue seeming reduced  Closure for my retirement is addressed today Glenn [name] preferring at least 1 more session if possible stating appointment today was prolonged by being scheduled with another provider in error.  He is more savvy than his dog at home planning to place a camera to see what the dog does all day while Glenn [name] is at work since the dog does not sleep at night..  He has no mania, suicidality,  psychosis or delirium requiring less Valium and little trazodone taking regularly his Ritalin for fatigue and depression along with Luvox.  Depression The patient presents withdepressionasa recurrentproblemwithmost recent episode starting 8monthsago. The onset quality is sudden. The problem occurs intermittently.The problem has sustained improvement now after waxing and waningfromonset.Associated symptoms include decreased concentration,fatigue,chronic pain, prepanic, excessive worry, and reactive sadness. Associated symptoms include no helplessness,hopelessness,decreased interest, stress at work, insomnia,restlessness,appetite change, headaches, orsuicidality.The symptoms are aggravated by work stress, medication, social issues, and family issues.Past treatments include SSRIs - Selective serotonin reuptake inhibitors, TCAs - Tricyclic antidepressants, other medications and psychotherapy.Compliance with treatment is variable.Past compliance problems include medical issues, difficulty with treatment plan and medication issues.Previous treatment provided mildrelief.Risk factors include a change in medication usage/dosage, a recent illness, alcohol intake, family history, family history of mental illness, history of mental illness, major life event and stress. Past medical history includes chronic fatigue syndrome,anxiety,depressionand mental health disorder. Pertinent negatives include no life-threatening condition,no physical disability,no recent psychiatric admission,no bipolar disorder,no eating disorder,no obsessive-compulsive disorder,no post-traumatic stress disorder,no schizophrenia,no suicide attemptsand no head trauma.  Individual Medical History/ Review of Systems: Changes? :Yes Weight is down 12 pounds in the last 3.5 months by exercise and appropriate diet with little tobacco or alcohol  Allergies: Morphine and related  Current  Medications:  Current Outpatient Medications:  .  b complex vitamins capsule, Take 1 capsule by mouth daily., Disp: , Rfl:  .  diazepam (VALIUM) 10 MG tablet, Take 1 tablet (10 mg total) by mouth  2 (two) times daily as needed for anxiety (panic)., Disp: 60 tablet, Rfl: 5 .  doxycycline (VIBRA-TABS) 100 MG tablet, Take 1 tablet (100 mg total) by mouth 2 (two) times daily., Disp: 20 tablet, Rfl: 0 .  fluvoxaMINE (LUVOX) 100 MG tablet, TAKE 1 TABLET(100 MG) BY MOUTH AT BEDTIME, Disp: 30 tablet, Rfl: 5 .  losartan (COZAAR) 100 MG tablet, TAKE 1 TABLET(100 MG) BY MOUTH DAILY, Disp: 90 tablet, Rfl: 1 .  [START ON 05/31/2020] methylphenidate (RITALIN) 10 MG tablet, Take 1 tablet (10 mg total) by mouth 3 (three) times daily with meals., Disp: 90 tablet, Rfl: 0 .  [START ON 06/30/2020] methylphenidate (RITALIN) 10 MG tablet, Take 1 tablet (10 mg total) by mouth 3 (three) times daily with meals., Disp: 90 tablet, Rfl: 0 .  [START ON 07/30/2020] methylphenidate (RITALIN) 10 MG tablet, Take 1 tablet (10 mg total) by mouth 3 (three) times daily with meals., Disp: 90 tablet, Rfl: 0 .  traZODone (DESYREL) 100 MG tablet, TAKE 1 TABLET(100 MG) BY MOUTH AT BEDTIME, Disp: 30 tablet, Rfl: 5  Medication Side Effects: none  Family Medical/ Social History: Changes? No  MENTAL HEALTH EXAM:  Height 5\' 8"  (1.727 m), weight 206 lb (93.4 kg).Body mass index is 31.32 kg/m. Muscle strengths and tone 5/5, postural reflexes and gait 0/0, and AIMS = 0.  General Appearance: Casual, Fairly Groomed, Meticulous and Obese  Eye Contact:  Good  Speech:  Clear and Coherent, Garbled, Normal Rate and Talkative  Volume:  Normal  Mood:  Anxious, Dysphoric and Euthymic  Affect:  Congruent, Inappropriate, Labile and Anxious  Thought Process:  Coherent, Goal Directed, Irrelevant, Linear and Descriptions of Associations: Circumstantial  Orientation:  Full (Time, Place, and Person)  Thought Content: Obsessions and Rumination   Suicidal  Thoughts:  No resolving previous passive suicidal ideation  Homicidal Thoughts:  No  Memory:  Immediate;   Good Remote;   Fair  Judgement:  Fair  Insight:  Fair and Lacking  Psychomotor Activity:  Normal and Mannerisms  Concentration:  Concentration: Fair and Attention Span: Good  Recall:  [place] of Knowledge: Good  Language: Fair  Assets:  Desire for Improvement Leisure Time Resilience Talents/Skills Vocational/Educational  ADL's:  Intact  Cognition: WNL  Prognosis:  Fair    DIAGNOSES:    ICD-10-CM   1. Panic disorder  F41.0 diazepam (VALIUM) 10 MG tablet    fluvoxaMINE (LUVOX) 100 MG tablet    traZODone (DESYREL) 100 MG tablet  2. Mood disorder with depressive features due to general medical condition  F06.31 fluvoxaMINE (LUVOX) 100 MG tablet    methylphenidate (RITALIN) 10 MG tablet    methylphenidate (RITALIN) 10 MG tablet    methylphenidate (RITALIN) 10 MG tablet  3. Recurrent major depression in partial remission (HCC)  F33.41 methylphenidate (RITALIN) 10 MG tablet    methylphenidate (RITALIN) 10 MG tablet    methylphenidate (RITALIN) 10 MG tablet  4. GAD (generalized anxiety disorder)  F41.1 diazepam (VALIUM) 10 MG tablet    fluvoxaMINE (LUVOX) 100 MG tablet    traZODone (DESYREL) 100 MG tablet    Receiving Psychotherapy: No but last seeing his EAP   RECOMMENDATIONS: Nathanal is matter-of-fact regarding his progress which iis significant in the last year including in the last 3 months.  He is now more conservative in his use of the Valium and trazodone benefiting most from his regular use of Ritalin and Luvox especially for depression but also anxiety.  Closure of care with myself for  transference relative to seeing advanced practitioner in the near future can be secured.  He is escribed Luvox 100 mg every bedtime sent as #30 with 5 refills to [facility] [address] for depression and anxiety.  He is E scribed Ritalin 10 mg IR 3 times daily sent as #90 each for October  3, November 2, and December 2 to [address] with [place] registry documenting last dispensing 05/01/2020 for the Ritalin and September 2 for the Valium with registry and epic appropriate in the last year for continued controlled substance.  He has E scribed Valium 10 mg twice daily as needed for panic or generalized anxiety sent as #60 with 5 refills to [facility].  He has not scribed trazodone 100 mg tablet at bedtime as needed for insomnia sent as #30 with 5 refills to [facility] [address] for insomnia of depression and anxiety.  Follow-up appointment is in 3 months or sooner if needed.   Delight Hoh, MD

## 2020-05-28 ENCOUNTER — Other Ambulatory Visit: Payer: Self-pay | Admitting: Psychiatry

## 2020-05-28 DIAGNOSIS — F411 Generalized anxiety disorder: Secondary | ICD-10-CM

## 2020-05-28 DIAGNOSIS — F410 Panic disorder [episodic paroxysmal anxiety] without agoraphobia: Secondary | ICD-10-CM

## 2020-05-28 NOTE — Telephone Encounter (Signed)
Please review

## 2020-06-18 ENCOUNTER — Encounter: Payer: Self-pay | Admitting: Psychiatry

## 2020-06-25 ENCOUNTER — Other Ambulatory Visit: Payer: Self-pay | Admitting: Family Medicine

## 2020-06-29 DIAGNOSIS — K759 Inflammatory liver disease, unspecified: Secondary | ICD-10-CM

## 2020-06-29 HISTORY — DX: Inflammatory liver disease, unspecified: K75.9

## 2020-07-27 ENCOUNTER — Observation Stay (HOSPITAL_COMMUNITY): Payer: 59

## 2020-07-27 ENCOUNTER — Emergency Department (HOSPITAL_COMMUNITY): Payer: 59

## 2020-07-27 ENCOUNTER — Inpatient Hospital Stay (HOSPITAL_COMMUNITY)
Admission: EM | Admit: 2020-07-27 | Discharge: 2020-07-31 | DRG: 442 | Disposition: A | Discharge status: Home / Self Care | Payer: 59 | Attending: Internal Medicine | Admitting: Internal Medicine

## 2020-07-27 ENCOUNTER — Other Ambulatory Visit: Payer: Self-pay | Admitting: Physician Assistant

## 2020-07-27 ENCOUNTER — Encounter (HOSPITAL_COMMUNITY): Payer: Self-pay | Admitting: Radiology

## 2020-07-27 ENCOUNTER — Other Ambulatory Visit: Payer: Self-pay

## 2020-07-27 DIAGNOSIS — N179 Acute kidney failure, unspecified: Secondary | ICD-10-CM | POA: Diagnosis present

## 2020-07-27 DIAGNOSIS — F1020 Alcohol dependence, uncomplicated: Secondary | ICD-10-CM

## 2020-07-27 DIAGNOSIS — R5382 Chronic fatigue, unspecified: Secondary | ICD-10-CM | POA: Diagnosis present

## 2020-07-27 DIAGNOSIS — K760 Fatty (change of) liver, not elsewhere classified: Principal | ICD-10-CM | POA: Diagnosis present

## 2020-07-27 DIAGNOSIS — R109 Unspecified abdominal pain: Secondary | ICD-10-CM | POA: Diagnosis present

## 2020-07-27 DIAGNOSIS — I10 Essential (primary) hypertension: Secondary | ICD-10-CM | POA: Diagnosis present

## 2020-07-27 DIAGNOSIS — F0631 Mood disorder due to known physiological condition with depressive features: Secondary | ICD-10-CM | POA: Diagnosis present

## 2020-07-27 DIAGNOSIS — F10230 Alcohol dependence with withdrawal, uncomplicated: Secondary | ICD-10-CM | POA: Diagnosis present

## 2020-07-27 DIAGNOSIS — G4700 Insomnia, unspecified: Secondary | ICD-10-CM | POA: Diagnosis present

## 2020-07-27 DIAGNOSIS — Z720 Tobacco use: Secondary | ICD-10-CM | POA: Diagnosis present

## 2020-07-27 DIAGNOSIS — R7989 Other specified abnormal findings of blood chemistry: Secondary | ICD-10-CM | POA: Diagnosis not present

## 2020-07-27 DIAGNOSIS — B179 Acute viral hepatitis, unspecified: Secondary | ICD-10-CM | POA: Diagnosis not present

## 2020-07-27 DIAGNOSIS — F1091 Alcohol use, unspecified, in remission: Secondary | ICD-10-CM | POA: Diagnosis present

## 2020-07-27 DIAGNOSIS — R1084 Generalized abdominal pain: Secondary | ICD-10-CM | POA: Diagnosis not present

## 2020-07-27 DIAGNOSIS — Z8249 Family history of ischemic heart disease and other diseases of the circulatory system: Secondary | ICD-10-CM

## 2020-07-27 DIAGNOSIS — R1013 Epigastric pain: Secondary | ICD-10-CM | POA: Diagnosis present

## 2020-07-27 DIAGNOSIS — F411 Generalized anxiety disorder: Secondary | ICD-10-CM | POA: Diagnosis present

## 2020-07-27 DIAGNOSIS — F410 Panic disorder [episodic paroxysmal anxiety] without agoraphobia: Secondary | ICD-10-CM | POA: Diagnosis present

## 2020-07-27 DIAGNOSIS — E871 Hypo-osmolality and hyponatremia: Secondary | ICD-10-CM | POA: Diagnosis present

## 2020-07-27 DIAGNOSIS — F17220 Nicotine dependence, chewing tobacco, uncomplicated: Secondary | ICD-10-CM | POA: Diagnosis present

## 2020-07-27 DIAGNOSIS — Z833 Family history of diabetes mellitus: Secondary | ICD-10-CM

## 2020-07-27 DIAGNOSIS — F325 Major depressive disorder, single episode, in full remission: Secondary | ICD-10-CM | POA: Diagnosis present

## 2020-07-27 DIAGNOSIS — K2100 Gastro-esophageal reflux disease with esophagitis, without bleeding: Secondary | ICD-10-CM | POA: Diagnosis present

## 2020-07-27 DIAGNOSIS — K2970 Gastritis, unspecified, without bleeding: Secondary | ICD-10-CM | POA: Diagnosis present

## 2020-07-27 DIAGNOSIS — Z20822 Contact with and (suspected) exposure to covid-19: Secondary | ICD-10-CM | POA: Diagnosis present

## 2020-07-27 DIAGNOSIS — E669 Obesity, unspecified: Secondary | ICD-10-CM | POA: Diagnosis present

## 2020-07-27 DIAGNOSIS — Z6831 Body mass index (BMI) 31.0-31.9, adult: Secondary | ICD-10-CM

## 2020-07-27 DIAGNOSIS — F10930 Alcohol use, unspecified with withdrawal, uncomplicated: Secondary | ICD-10-CM

## 2020-07-27 DIAGNOSIS — R748 Abnormal levels of other serum enzymes: Secondary | ICD-10-CM

## 2020-07-27 DIAGNOSIS — K863 Pseudocyst of pancreas: Secondary | ICD-10-CM | POA: Diagnosis present

## 2020-07-27 DIAGNOSIS — Z801 Family history of malignant neoplasm of trachea, bronchus and lung: Secondary | ICD-10-CM

## 2020-07-27 LAB — CBC WITH DIFFERENTIAL/PLATELET
Abs Immature Granulocytes: 0.02 10*3/uL (ref 0.00–0.07)
Basophils Absolute: 0 10*3/uL (ref 0.0–0.1)
Basophils Relative: 1 %
Eosinophils Absolute: 0.1 10*3/uL (ref 0.0–0.5)
Eosinophils Relative: 1 %
HCT: 53.2 % — ABNORMAL HIGH (ref 39.0–52.0)
Hemoglobin: 18.4 g/dL — ABNORMAL HIGH (ref 13.0–17.0)
Immature Granulocytes: 0 %
Lymphocytes Relative: 24 %
Lymphs Abs: 1.9 10*3/uL (ref 0.7–4.0)
MCH: 31.3 pg (ref 26.0–34.0)
MCHC: 34.6 g/dL (ref 30.0–36.0)
MCV: 90.6 fL (ref 80.0–100.0)
Monocytes Absolute: 0.5 10*3/uL (ref 0.1–1.0)
Monocytes Relative: 6 %
Neutro Abs: 5.4 10*3/uL (ref 1.7–7.7)
Neutrophils Relative %: 68 %
Platelets: 268 10*3/uL (ref 150–400)
RBC: 5.87 MIL/uL — ABNORMAL HIGH (ref 4.22–5.81)
RDW: 11.9 % (ref 11.5–15.5)
WBC: 8 10*3/uL (ref 4.0–10.5)
nRBC: 0 % (ref 0.0–0.2)

## 2020-07-27 LAB — HEPATITIS PANEL, ACUTE
HCV Ab: NONREACTIVE
Hep A IgM: NONREACTIVE
Hep B C IgM: NONREACTIVE
Hepatitis B Surface Ag: NONREACTIVE

## 2020-07-27 LAB — URINALYSIS, ROUTINE W REFLEX MICROSCOPIC
Bilirubin Urine: NEGATIVE
Glucose, UA: NEGATIVE mg/dL
Hgb urine dipstick: NEGATIVE
Ketones, ur: NEGATIVE mg/dL
Leukocytes,Ua: NEGATIVE
Nitrite: NEGATIVE
Protein, ur: NEGATIVE mg/dL
Specific Gravity, Urine: 1.027 (ref 1.005–1.030)
pH: 5 (ref 5.0–8.0)

## 2020-07-27 LAB — PROTIME-INR
INR: 1.8 — ABNORMAL HIGH (ref 0.8–1.2)
Prothrombin Time: 20 seconds — ABNORMAL HIGH (ref 11.4–15.2)

## 2020-07-27 LAB — I-STAT VENOUS BLOOD GAS, ED
Acid-base deficit: 5 mmol/L — ABNORMAL HIGH (ref 0.0–2.0)
Bicarbonate: 22.2 mmol/L (ref 20.0–28.0)
Calcium, Ion: 1.05 mmol/L — ABNORMAL LOW (ref 1.15–1.40)
HCT: 52 % (ref 39.0–52.0)
Hemoglobin: 17.7 g/dL — ABNORMAL HIGH (ref 13.0–17.0)
O2 Saturation: 27 %
Potassium: 5.7 mmol/L — ABNORMAL HIGH (ref 3.5–5.1)
Sodium: 136 mmol/L (ref 135–145)
TCO2: 24 mmol/L (ref 22–32)
pCO2, Ven: 46.3 mmHg (ref 44.0–60.0)
pH, Ven: 7.288 (ref 7.250–7.430)
pO2, Ven: 20 mmHg — CL (ref 32.0–45.0)

## 2020-07-27 LAB — ACETAMINOPHEN LEVEL: Acetaminophen (Tylenol), Serum: <10 ug/mL — ABNORMAL LOW (ref 10–30)

## 2020-07-27 LAB — MAGNESIUM: Magnesium: 1.9 mg/dL (ref 1.7–2.4)

## 2020-07-27 LAB — COMPREHENSIVE METABOLIC PANEL
ALT: 492 U/L — ABNORMAL HIGH (ref 0–44)
AST: 940 U/L — ABNORMAL HIGH (ref 15–41)
Albumin: 4.1 g/dL (ref 3.5–5.0)
Alkaline Phosphatase: 134 U/L — ABNORMAL HIGH (ref 38–126)
Anion gap: 16 — ABNORMAL HIGH (ref 5–15)
BUN: 29 mg/dL — ABNORMAL HIGH (ref 6–20)
CO2: 17 mmol/L — ABNORMAL LOW (ref 22–32)
Calcium: 8.9 mg/dL (ref 8.9–10.3)
Chloride: 102 mmol/L (ref 98–111)
Creatinine, Ser: 1.59 mg/dL — ABNORMAL HIGH (ref 0.61–1.24)
GFR, Estimated: 56 mL/min — ABNORMAL LOW (ref >60)
Glucose, Bld: 165 mg/dL — ABNORMAL HIGH (ref 70–99)
Potassium: 4.7 mmol/L (ref 3.5–5.1)
Sodium: 135 mmol/L (ref 135–145)
Total Bilirubin: 2.2 mg/dL — ABNORMAL HIGH (ref 0.3–1.2)
Total Protein: 7.3 g/dL (ref 6.5–8.1)

## 2020-07-27 LAB — LACTATE DEHYDROGENASE: LDH: 448 U/L — ABNORMAL HIGH (ref 98–192)

## 2020-07-27 LAB — HEPATITIS B SURFACE ANTIBODY,QUALITATIVE: Hep B S Ab: NONREACTIVE

## 2020-07-27 LAB — HEPATITIS B CORE ANTIBODY, IGM: Hep B C IgM: NONREACTIVE

## 2020-07-27 LAB — APTT: aPTT: 33 seconds (ref 24–36)

## 2020-07-27 LAB — HIV ANTIBODY (ROUTINE TESTING W REFLEX): HIV Screen 4th Generation wRfx: NONREACTIVE

## 2020-07-27 LAB — TROPONIN I (HIGH SENSITIVITY)
Troponin I (High Sensitivity): 7 ng/L (ref <18)
Troponin I (High Sensitivity): 7 ng/L (ref <18)

## 2020-07-27 LAB — RESP PANEL BY RT-PCR (FLU A&B, COVID) ARPGX2
Influenza A by PCR: NEGATIVE
Influenza B by PCR: NEGATIVE
SARS Coronavirus 2 by RT PCR: NEGATIVE

## 2020-07-27 LAB — PHOSPHORUS: Phosphorus: 4.2 mg/dL (ref 2.5–4.6)

## 2020-07-27 LAB — LIPASE, BLOOD: Lipase: 56 U/L — ABNORMAL HIGH (ref 11–51)

## 2020-07-27 LAB — HEPATITIS C ANTIBODY: HCV Ab: NONREACTIVE

## 2020-07-27 LAB — TSH: TSH: 1.802 u[IU]/mL (ref 0.350–4.500)

## 2020-07-27 LAB — HEPATITIS A ANTIBODY, IGM: Hep A IgM: NONREACTIVE

## 2020-07-27 MED ORDER — LORAZEPAM 1 MG PO TABS: 0.0000 mg | ORAL_TABLET | Freq: Two times a day (BID) | ORAL | Status: DC

## 2020-07-27 MED ORDER — VITAMIN K1 10 MG/ML IJ SOLN
10.0000 mg | Freq: Every day | INTRAVENOUS | Status: AC
  Administered 2020-07-28 – 2020-07-30 (×3): 10 mg via INTRAVENOUS
  Filled 2020-07-27 (×4): qty 1

## 2020-07-27 MED ORDER — ONDANSETRON HCL 4 MG/2ML IJ SOLN
4.0000 mg | Freq: Once | INTRAMUSCULAR | Status: AC
  Administered 2020-07-27: 4 mg via INTRAVENOUS
  Filled 2020-07-27: qty 2

## 2020-07-27 MED ORDER — HYDROMORPHONE HCL 1 MG/ML IJ SOLN
1.0000 mg | INTRAMUSCULAR | Status: DC | PRN
  Administered 2020-07-27 – 2020-07-30 (×22): 1 mg via INTRAVENOUS
  Filled 2020-07-27 (×22): qty 1

## 2020-07-27 MED ORDER — THIAMINE HCL 100 MG/ML IJ SOLN
100.0000 mg | Freq: Every day | INTRAMUSCULAR | Status: DC
  Administered 2020-07-27 – 2020-07-28 (×2): 100 mg via INTRAVENOUS
  Filled 2020-07-27 (×2): qty 2

## 2020-07-27 MED ORDER — ADULT MULTIVITAMIN W/MINERALS CH
1.0000 | ORAL_TABLET | Freq: Every day | ORAL | Status: DC
  Administered 2020-07-27 – 2020-07-31 (×5): 1 via ORAL
  Filled 2020-07-27 (×5): qty 1

## 2020-07-27 MED ORDER — LORAZEPAM 2 MG/ML IJ SOLN: 0.0000 mg | Freq: Two times a day (BID) | INTRAMUSCULAR | Status: DC

## 2020-07-27 MED ORDER — FOLIC ACID 1 MG PO TABS
1.0000 mg | ORAL_TABLET | Freq: Every day | ORAL | Status: DC
  Administered 2020-07-27 – 2020-07-31 (×5): 1 mg via ORAL
  Filled 2020-07-27 (×5): qty 1

## 2020-07-27 MED ORDER — TRAZODONE HCL 100 MG PO TABS: 100.0000 mg | ORAL_TABLET | Freq: Every evening | ORAL | Status: DC | PRN

## 2020-07-27 MED ORDER — ENOXAPARIN SODIUM 40 MG/0.4ML ~~LOC~~ SOLN: 40.0000 mg | SUBCUTANEOUS | Status: DC

## 2020-07-27 MED ORDER — GADOBUTROL 1 MMOL/ML IV SOLN
9.0000 mL | Freq: Once | INTRAVENOUS | Status: AC | PRN
  Administered 2020-07-27: 9 mL via INTRAVENOUS

## 2020-07-27 MED ORDER — FENTANYL CITRATE (PF) 100 MCG/2ML IJ SOLN
12.5000 ug | INTRAMUSCULAR | Status: DC | PRN
  Administered 2020-07-27 (×2): 12.5 ug via INTRAVENOUS
  Filled 2020-07-27 (×3): qty 2

## 2020-07-27 MED ORDER — THIAMINE HCL 100 MG PO TABS
100.0000 mg | ORAL_TABLET | Freq: Every day | ORAL | Status: DC
  Administered 2020-07-29 – 2020-07-31 (×3): 100 mg via ORAL
  Filled 2020-07-27 (×3): qty 1

## 2020-07-27 MED ORDER — FLUVOXAMINE MALEATE 100 MG PO TABS
100.0000 mg | ORAL_TABLET | Freq: Every day | ORAL | Status: DC
  Administered 2020-07-27 – 2020-07-30 (×4): 100 mg via ORAL
  Filled 2020-07-27 (×4): qty 1

## 2020-07-27 MED ORDER — HYDROMORPHONE HCL 1 MG/ML IJ SOLN
1.0000 mg | Freq: Once | INTRAMUSCULAR | Status: AC
  Administered 2020-07-27: 1 mg via INTRAVENOUS
  Filled 2020-07-27: qty 1

## 2020-07-27 MED ORDER — LACTATED RINGERS IV SOLN: INTRAVENOUS | Status: AC

## 2020-07-27 MED ORDER — ONDANSETRON HCL 4 MG PO TABS: 4.0000 mg | ORAL_TABLET | Freq: Four times a day (QID) | ORAL | Status: DC | PRN

## 2020-07-27 MED ORDER — PANTOPRAZOLE SODIUM 40 MG IV SOLR
40.0000 mg | Freq: Once | INTRAVENOUS | Status: AC
  Administered 2020-07-27: 40 mg via INTRAVENOUS
  Filled 2020-07-27: qty 40

## 2020-07-27 MED ORDER — SODIUM CHLORIDE 0.9 % IV BOLUS
1000.0000 mL | Freq: Once | INTRAVENOUS | Status: AC
  Administered 2020-07-27: 1000 mL via INTRAVENOUS

## 2020-07-27 MED ORDER — LACTATED RINGERS IV BOLUS
1000.0000 mL | Freq: Once | INTRAVENOUS | Status: AC
  Administered 2020-07-27: 1000 mL via INTRAVENOUS

## 2020-07-27 MED ORDER — IOHEXOL 300 MG/ML  SOLN
100.0000 mL | Freq: Once | INTRAMUSCULAR | Status: AC | PRN
  Administered 2020-07-27: 100 mL via INTRAVENOUS

## 2020-07-27 MED ORDER — LORAZEPAM 2 MG/ML IJ SOLN
0.0000 mg | Freq: Four times a day (QID) | INTRAMUSCULAR | Status: DC
  Administered 2020-07-27: 2 mg via INTRAVENOUS
  Filled 2020-07-27: qty 1

## 2020-07-27 MED ORDER — LORAZEPAM 2 MG/ML IJ SOLN
1.0000 mg | INTRAMUSCULAR | Status: AC | PRN
  Administered 2020-07-28: 1 mg via INTRAVENOUS
  Administered 2020-07-30: 2 mg via INTRAVENOUS
  Filled 2020-07-27 (×2): qty 1

## 2020-07-27 MED ORDER — LOSARTAN POTASSIUM 50 MG PO TABS
100.0000 mg | ORAL_TABLET | Freq: Every day | ORAL | Status: DC
  Administered 2020-07-28: 100 mg via ORAL
  Filled 2020-07-27: qty 2

## 2020-07-27 MED ORDER — HYDROMORPHONE HCL 1 MG/ML IJ SOLN
0.5000 mg | Freq: Once | INTRAMUSCULAR | Status: AC
  Administered 2020-07-27: 0.5 mg via INTRAVENOUS
  Filled 2020-07-27: qty 1

## 2020-07-27 MED ORDER — LORAZEPAM 1 MG PO TABS
1.0000 mg | ORAL_TABLET | ORAL | Status: AC | PRN
  Administered 2020-07-28 – 2020-07-29 (×3): 1 mg via ORAL
  Filled 2020-07-27 (×4): qty 1

## 2020-07-27 MED ORDER — ONDANSETRON HCL 4 MG/2ML IJ SOLN
4.0000 mg | Freq: Four times a day (QID) | INTRAMUSCULAR | Status: DC | PRN
  Administered 2020-07-28 (×2): 4 mg via INTRAVENOUS
  Filled 2020-07-27 (×2): qty 2

## 2020-07-27 MED ORDER — METHYLPHENIDATE HCL 5 MG PO TABS
10.0000 mg | ORAL_TABLET | Freq: Three times a day (TID) | ORAL | Status: DC
  Administered 2020-07-29 – 2020-07-30 (×5): 10 mg via ORAL
  Filled 2020-07-27: qty 1
  Filled 2020-07-27 (×8): qty 2

## 2020-07-27 MED ORDER — LORAZEPAM 1 MG PO TABS: 0.0000 mg | ORAL_TABLET | Freq: Four times a day (QID) | ORAL | Status: DC
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
## 2020-07-27 NOTE — ED Notes (Signed)
Pt returned from US

## 2020-07-27 NOTE — ED Notes (Signed)
Pt returned from MRI °

## 2020-07-27 NOTE — ED Triage Notes (Signed)
Pt arrives via EMS from home with complaints of abdominal pain and back pain of 8/10. Per EMS pain began around midnight. Denies N/V, no chest pain. Alert and oriented X4

## 2020-07-27 NOTE — ED Notes (Signed)
Patient transported to CT 

## 2020-07-27 NOTE — ED Notes (Signed)
Pt transported to US
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
## 2020-07-27 NOTE — ED Notes (Signed)
Help get patient on the monitor patient is resting with call bell in reach 

## 2020-07-27 NOTE — ED Notes (Signed)
Per MRI, pt needs to remain NPO until after completion of MRI

## 2020-07-27 NOTE — ED Notes (Signed)
Dinner Tray Ordered @ 1716. 
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
## 2020-07-28 ENCOUNTER — Encounter (HOSPITAL_COMMUNITY): Payer: Self-pay | Admitting: Internal Medicine

## 2020-07-28 DIAGNOSIS — K760 Fatty (change of) liver, not elsewhere classified: Principal | ICD-10-CM

## 2020-07-28 DIAGNOSIS — F410 Panic disorder [episodic paroxysmal anxiety] without agoraphobia: Secondary | ICD-10-CM | POA: Diagnosis present

## 2020-07-28 DIAGNOSIS — F17220 Nicotine dependence, chewing tobacco, uncomplicated: Secondary | ICD-10-CM | POA: Diagnosis present

## 2020-07-28 DIAGNOSIS — B179 Acute viral hepatitis, unspecified: Secondary | ICD-10-CM | POA: Diagnosis not present

## 2020-07-28 DIAGNOSIS — E669 Obesity, unspecified: Secondary | ICD-10-CM | POA: Diagnosis present

## 2020-07-28 DIAGNOSIS — R1013 Epigastric pain: Secondary | ICD-10-CM | POA: Diagnosis present

## 2020-07-28 DIAGNOSIS — K209 Esophagitis, unspecified without bleeding: Secondary | ICD-10-CM | POA: Diagnosis not present

## 2020-07-28 DIAGNOSIS — K863 Pseudocyst of pancreas: Secondary | ICD-10-CM | POA: Diagnosis present

## 2020-07-28 DIAGNOSIS — Z6831 Body mass index (BMI) 31.0-31.9, adult: Secondary | ICD-10-CM | POA: Diagnosis not present

## 2020-07-28 DIAGNOSIS — I10 Essential (primary) hypertension: Secondary | ICD-10-CM | POA: Diagnosis present

## 2020-07-28 DIAGNOSIS — F10230 Alcohol dependence with withdrawal, uncomplicated: Secondary | ICD-10-CM

## 2020-07-28 DIAGNOSIS — Z801 Family history of malignant neoplasm of trachea, bronchus and lung: Secondary | ICD-10-CM | POA: Diagnosis not present

## 2020-07-28 DIAGNOSIS — R945 Abnormal results of liver function studies: Secondary | ICD-10-CM | POA: Diagnosis not present

## 2020-07-28 DIAGNOSIS — N179 Acute kidney failure, unspecified: Secondary | ICD-10-CM | POA: Diagnosis present

## 2020-07-28 DIAGNOSIS — F325 Major depressive disorder, single episode, in full remission: Secondary | ICD-10-CM | POA: Diagnosis present

## 2020-07-28 DIAGNOSIS — G4700 Insomnia, unspecified: Secondary | ICD-10-CM | POA: Diagnosis present

## 2020-07-28 DIAGNOSIS — F411 Generalized anxiety disorder: Secondary | ICD-10-CM | POA: Diagnosis present

## 2020-07-28 DIAGNOSIS — K2970 Gastritis, unspecified, without bleeding: Secondary | ICD-10-CM | POA: Diagnosis present

## 2020-07-28 DIAGNOSIS — E871 Hypo-osmolality and hyponatremia: Secondary | ICD-10-CM | POA: Diagnosis present

## 2020-07-28 DIAGNOSIS — R1011 Right upper quadrant pain: Secondary | ICD-10-CM

## 2020-07-28 DIAGNOSIS — R1084 Generalized abdominal pain: Secondary | ICD-10-CM | POA: Diagnosis not present

## 2020-07-28 DIAGNOSIS — F1020 Alcohol dependence, uncomplicated: Secondary | ICD-10-CM | POA: Diagnosis not present

## 2020-07-28 DIAGNOSIS — Z20822 Contact with and (suspected) exposure to covid-19: Secondary | ICD-10-CM | POA: Diagnosis present

## 2020-07-28 DIAGNOSIS — K2100 Gastro-esophageal reflux disease with esophagitis, without bleeding: Secondary | ICD-10-CM | POA: Diagnosis present

## 2020-07-28 DIAGNOSIS — Z833 Family history of diabetes mellitus: Secondary | ICD-10-CM | POA: Diagnosis not present

## 2020-07-28 DIAGNOSIS — R5382 Chronic fatigue, unspecified: Secondary | ICD-10-CM | POA: Diagnosis present

## 2020-07-28 DIAGNOSIS — Z8249 Family history of ischemic heart disease and other diseases of the circulatory system: Secondary | ICD-10-CM | POA: Diagnosis not present

## 2020-07-28 DIAGNOSIS — K3189 Other diseases of stomach and duodenum: Secondary | ICD-10-CM | POA: Diagnosis not present

## 2020-07-28 LAB — CBC
HCT: 40.1 % (ref 39.0–52.0)
Hemoglobin: 13.7 g/dL (ref 13.0–17.0)
MCH: 31.3 pg (ref 26.0–34.0)
MCHC: 34.2 g/dL (ref 30.0–36.0)
MCV: 91.6 fL (ref 80.0–100.0)
Platelets: 157 10*3/uL (ref 150–400)
RBC: 4.38 MIL/uL (ref 4.22–5.81)
RDW: 11.9 % (ref 11.5–15.5)
WBC: 7.9 10*3/uL (ref 4.0–10.5)
nRBC: 0 % (ref 0.0–0.2)

## 2020-07-28 LAB — COMPREHENSIVE METABOLIC PANEL
ALT: 396 U/L — ABNORMAL HIGH (ref 0–44)
AST: 513 U/L — ABNORMAL HIGH (ref 15–41)
Albumin: 3.3 g/dL — ABNORMAL LOW (ref 3.5–5.0)
Alkaline Phosphatase: 91 U/L (ref 38–126)
Anion gap: 7 (ref 5–15)
BUN: 12 mg/dL (ref 6–20)
CO2: 23 mmol/L (ref 22–32)
Calcium: 8.4 mg/dL — ABNORMAL LOW (ref 8.9–10.3)
Chloride: 97 mmol/L — ABNORMAL LOW (ref 98–111)
Creatinine, Ser: 1.22 mg/dL (ref 0.61–1.24)
GFR, Estimated: >60 mL/min (ref >60)
Glucose, Bld: 176 mg/dL — ABNORMAL HIGH (ref 70–99)
Potassium: 3.9 mmol/L (ref 3.5–5.1)
Sodium: 127 mmol/L — ABNORMAL LOW (ref 135–145)
Total Bilirubin: 3.1 mg/dL — ABNORMAL HIGH (ref 0.3–1.2)
Total Protein: 5.9 g/dL — ABNORMAL LOW (ref 6.5–8.1)

## 2020-07-28 LAB — PROTIME-INR
INR: 1.3 — ABNORMAL HIGH (ref 0.8–1.2)
Prothrombin Time: 16 seconds — ABNORMAL HIGH (ref 11.4–15.2)

## 2020-07-28 LAB — IRON AND TIBC
Iron: 187 ug/dL — ABNORMAL HIGH (ref 45–182)
Saturation Ratios: 97 % — ABNORMAL HIGH (ref 17.9–39.5)
TIBC: 193 ug/dL — ABNORMAL LOW (ref 250–450)
UIBC: 6 ug/dL

## 2020-07-28 LAB — ANTI-SMOOTH MUSCLE ANTIBODY, IGG: F-Actin IgG: 12 Units (ref 0–19)

## 2020-07-28 LAB — HEPATITIS A ANTIBODY, TOTAL: hep A Total Ab: NONREACTIVE

## 2020-07-28 LAB — CK: Total CK: 62 U/L (ref 49–397)

## 2020-07-28 LAB — FERRITIN: Ferritin: 6880 ng/mL — ABNORMAL HIGH (ref 24–336)

## 2020-07-28 LAB — BILIRUBIN, DIRECT: Bilirubin, Direct: 1.4 mg/dL — ABNORMAL HIGH (ref 0.0–0.2)

## 2020-07-28 LAB — HEPATITIS B CORE ANTIBODY, TOTAL: Hep B Core Total Ab: NONREACTIVE

## 2020-07-28 LAB — ANA: Anti Nuclear Antibody (ANA): NEGATIVE

## 2020-07-28 MED ORDER — DIAZEPAM 5 MG PO TABS: 10.0000 mg | ORAL_TABLET | Freq: Two times a day (BID) | ORAL | Status: DC | PRN

## 2020-07-28 MED ORDER — LABETALOL HCL 5 MG/ML IV SOLN: 5.0000 mg | INTRAVENOUS | Status: DC | PRN

## 2020-07-28 MED ORDER — ENSURE ENLIVE PO LIQD
237.0000 mL | Freq: Two times a day (BID) | ORAL | Status: DC
  Administered 2020-07-28 – 2020-07-30 (×5): 237 mL via ORAL

## 2020-07-28 MED ORDER — AMLODIPINE BESYLATE 5 MG PO TABS
5.0000 mg | ORAL_TABLET | Freq: Every day | ORAL | Status: DC
  Administered 2020-07-28 – 2020-07-31 (×4): 5 mg via ORAL
  Filled 2020-07-28 (×4): qty 1

## 2020-07-28 NOTE — Progress Notes (Addendum)
Progress Note    Glenn [name]  MWU:[id number] DOB: February 07, 1981  DOA: 07/27/2020 PCP: Marin Olp, MD    Brief Narrative:     Medical records reviewed and are as summarized below:  Glenn [name] is an 39 y.o. male with medical history significant for hypertension, history of acute pancreatitis, panic disorder, depression, anxiety, history of alcohol abuse presenting to the emergency department for chief concerns of acute onset abdominal pain.  Found to have acute hepatitis. Work up in progress with GI.  ? ERCP  Assessment/Plan:   Principal Problem:   Acute hepatitis Active Problems:   Panic disorder   Tobacco abuse   Hepatic steatosis   Alcohol use disorder, moderate, in controlled environment (HCC)   GAD (generalized anxiety disorder)   Mood disorder with depressive features due to general medical condition   Chronic fatigue   Abdominal pain     Acute hepatitis/elevated LFTs with acute abdominal pain -Patient does not have GI specialist outpatient - LDH: 448 - acute hepatitis panel negative - anti-smooth muscle antibody negative, ANA pending -INR 1.3 -GI consult appreciated -MRCP:Hepatic steatosis. Cystic lesion at the tail of pancreas/splenic hilum seen on CT earlier today is obscured by susceptibility artifact from metallic fragment in the left abdomen -dialudid PRN  Hyponatremia -monitor -on IVF  Primary hypertension -hold  home losartan 100 mg daily  alcohol use -CIWA  Major depressive disorder - resumed home Luvox (fluvoxamine) 100 mg nightly  Panic disorder/anxiety -On valium at home  Insomnia -resumed home trazodone 100 mg nightly as needed for sleep  obesity Body mass index is 31.32 kg/m.   Family Communication/Anticipated D/C date and plan/Code Status   DVT prophylaxis:scd Code Status: Full Code.  Disposition Plan: Status is: Observation  The patient will require care spanning > 2 midnights and should be moved to inpatient  because: Inpatient level of care appropriate due to severity of illness  Dispo: The patient is from: Home              Anticipated d/c is to: Home              Anticipated d/c date is: 2 days              Patient currently is not medically stable to d/c.         Medical Consultants:    GI     Subjective:   Able to tolerate food but sill with pain  Objective:    Vitals:   07/28/20 0450 07/28/20 0900 07/28/20 1000 07/28/20 1213  BP: (!) 146/90 (!) 149/88 (!) 146/100 (!) 146/96  Pulse: 97 (!) 103 (!) 106 (!) 102  Resp: 18   18  Temp: 98.7 F (37.1 C)   99.4 F (37.4 C)  TempSrc: Oral   Oral  SpO2: 96%   97%  Weight:      Height:        Intake/Output Summary (Last 24 hours) at 07/28/2020 1327 Last data filed at 07/28/2020 1100 Gross per 24 hour  Intake 1198.5 ml  Output 1100 ml  Net 98.5 ml   Filed Weights   07/27/20 0858  Weight: 93.4 kg    Exam:  General: Appearance:    Obese male  Who appears uncomfortable     Lungs:      respirations unlabored  Heart:    Tachycardic. Normal rhythm. No murmurs, rubs, or gallops.   MS:   All extremities are intact.   Neurologic:  Awake, alert, oriented x 3. No apparent focal neurological           defect.     Data Reviewed:   I have personally reviewed following labs and imaging studies:  Labs: Labs show the following:   Basic Metabolic Panel: Recent Labs  Lab 07/27/20 0915 07/27/20 0915 07/27/20 1226 07/27/20 1429 07/28/20 0717  NA 135  --  136  --  127*  K 4.7   < > 5.7*  --  3.9  CL 102  --   --   --  97*  CO2 17*  --   --   --  23  GLUCOSE 165*  --   --   --  176*  BUN 29*  --   --   --  12  CREATININE 1.59*  --   --   --  1.22  CALCIUM 8.9  --   --   --  8.4*  MG  --   --   --  1.9  --   PHOS  --   --   --  4.2  --    < > = values in this interval not displayed.   GFR Estimated Creatinine Clearance: 90.1 mL/min (by C-G formula based on SCr of 1.22 mg/dL). Liver Function Tests: Recent  Labs  Lab 07/27/20 0915 07/28/20 0717  AST 940* 513*  ALT 492* 396*  ALKPHOS 134* 91  BILITOT 2.2* 3.1*  PROT 7.3 5.9*  ALBUMIN 4.1 3.3*   Recent Labs  Lab 07/27/20 0915  LIPASE 56*   No results for input(s): AMMONIA in the last 168 hours. Coagulation profile Recent Labs  Lab 07/27/20 1428 07/28/20 0717  INR 1.8* 1.3*    CBC: Recent Labs  Lab 07/27/20 0915 07/27/20 1226 07/28/20 0717  WBC 8.0  --  7.9  NEUTROABS 5.4  --   --   HGB 18.4* 17.7* 13.7  HCT 53.2* 52.0 40.1  MCV 90.6  --  91.6  PLT 268  --  157   Cardiac Enzymes: Recent Labs  Lab 07/28/20 0717  CKTOTAL 62   BNP (last 3 results) No results for input(s): PROBNP in the last 8760 hours. CBG: No results for input(s): GLUCAP in the last 168 hours. D-Dimer: No results for input(s): DDIMER in the last 72 hours. Hgb A1c: No results for input(s): HGBA1C in the last 72 hours. Lipid Profile: No results for input(s): CHOL, HDL, LDLCALC, TRIG, CHOLHDL, LDLDIRECT in the last 72 hours. Thyroid function studies: Recent Labs    07/27/20 1429  TSH 1.802   Anemia work up: Recent Labs    07/28/20 0717  FERRITIN 6,880*  TIBC 193*  IRON 187*   Sepsis Labs: Recent Labs  Lab 07/27/20 0915 07/28/20 0717  WBC 8.0 7.9    Microbiology Recent Results (from the past 240 hour(s))  Resp Panel by RT-PCR (Flu A&B, Covid) Nasopharyngeal Swab     Status: None   Collection Time: 07/27/20 11:29 AM   Specimen: Nasopharyngeal Swab; Nasopharyngeal(NP) swabs in vial transport medium  Result Value Ref Range Status   SARS Coronavirus 2 by RT PCR NEGATIVE NEGATIVE Final    Comment: (NOTE) SARS-CoV-2 target nucleic acids are NOT DETECTED.  The SARS-CoV-2 RNA is generally detectable in upper respiratory specimens during the acute phase of infection. The lowest concentration of SARS-CoV-2 viral copies this assay can detect is 138 copies/mL. A negative result does not preclude SARS-Cov-2 infection and should not be  used as the sole  basis for treatment or other patient management decisions. A negative result may occur with  improper specimen collection/handling, submission of specimen other than nasopharyngeal swab, presence of viral mutation(s) within the areas targeted by this assay, and inadequate number of viral copies(<138 copies/mL). A negative result must be combined with clinical observations, patient history, and epidemiological information. The expected result is Negative.  Fact Sheet for Patients:  EntrepreneurPulse.com.au  Fact Sheet for Healthcare Providers:  IncredibleEmployment.be  This test is no t yet approved or cleared by the [place] FDA and  has been authorized for detection and/or diagnosis of SARS-CoV-2 by FDA under an Emergency Use Authorization (EUA). This EUA will remain  in effect (meaning this test can be used) for the duration of the COVID-19 declaration under Section 564(b)(1) of the Act, 21 U.S.C.section 360bbb-3(b)(1), unless the authorization is terminated  or revoked sooner.       Influenza A by PCR NEGATIVE NEGATIVE Final   Influenza B by PCR NEGATIVE NEGATIVE Final    Comment: (NOTE) The Xpert Xpress SARS-CoV-2/FLU/RSV plus assay is intended as an aid in the diagnosis of influenza from Nasopharyngeal swab specimens and should not be used as a sole basis for treatment. Nasal washings and aspirates are unacceptable for Xpert Xpress SARS-CoV-2/FLU/RSV testing.  Fact Sheet for Patients: EntrepreneurPulse.com.au  Fact Sheet for Healthcare Providers: IncredibleEmployment.be  This test is not yet approved or cleared by the [place] FDA and has been authorized for detection and/or diagnosis of SARS-CoV-2 by FDA under an Emergency Use Authorization (EUA). This EUA will remain in effect (meaning this test can be used) for the duration of the COVID-19 declaration under Section  564(b)(1) of the Act, 21 U.S.C. section 360bbb-3(b)(1), unless the authorization is terminated or revoked.  Performed at [facility], [place] [address] [place], [place] [address]     Procedures and diagnostic studies:  CT Abdomen Pelvis W Contrast  Result Date: 07/27/2020 CLINICAL DATA:  Epigastric pain with nausea beginning around midnight last night. EXAM: CT ABDOMEN AND PELVIS WITH CONTRAST TECHNIQUE: Multidetector CT imaging of the abdomen and pelvis was performed using the standard protocol following bolus administration of intravenous contrast. CONTRAST:  144mL OMNIPAQUE IOHEXOL 300 MG/ML  SOLN COMPARISON:  02/16/2018 FINDINGS: Lower chest: Clear lung bases.  Heart normal in size. Hepatobiliary: Liver normal in size. Diffuse decreased attenuation of the liver consistent with fatty infiltration. Fatty sparing noted adjacent to the gallbladder and anterior superior liver. No liver mass. Normal gallbladder. No bile duct dilation Pancreas: Cyst lies along the pancreatic tail adjacent to the spleen measuring 2.2 x 1.7 x 2.4 cm. No other pancreatic masses. Duct dilation or active inflammation. Spleen: Normal in size without focal abnormality. Adrenals/Urinary Tract: No adrenal masses. Kidneys normal in size, orientation and position with symmetric enhancement. Subtle low-attenuation area in the upper pole suggests a cysts common 9 mm in size. No other masses, no stones and no hydronephrosis. Normal ureters. Normal bladder. Stomach/Bowel: Stomach is within normal limits. Appendix appears normal. No evidence of bowel wall thickening, distention, or inflammatory changes. Vascular/Lymphatic: No significant vascular findings are present. No enlarged abdominal or pelvic lymph nodes. Reproductive: Unremarkable. Other: No abdominal wall hernia or abnormality. No abdominopelvic ascites. Musculoskeletal: No fracture or acute finding. No osteoblastic or osteolytic lesions. IMPRESSION: 1. No acute findings.   No evidence of active pancreatitis. 2. 2.4 cm cyst lies between the pancreatic tail and spleen consistent with a pseudocyst given the patient's history of pancreatitis, new since the prior CT.  Recommend follow-up CT in 6-12 months to assess for stability. 3. Extensive hepatic steatosis. Electronically Signed   By: Lajean Manes M.D.   On: 07/27/2020 12:09   MR 3D Recon At Scanner  Result Date: 07/27/2020 CLINICAL DATA:  Fatty liver. EXAM: MRI ABDOMEN WITHOUT AND WITH CONTRAST (INCLUDING MRCP) TECHNIQUE: Multiplanar multisequence MR imaging of the abdomen was performed both before and after the administration of intravenous contrast. Heavily T2-weighted images of the biliary and pancreatic ducts were obtained, and three-dimensional MRCP images were rendered by post processing. CONTRAST:  2mL GADAVIST GADOBUTROL 1 MMOL/ML IV SOLN COMPARISON:  CT scan 07/27/2020 FINDINGS: Lower chest: Unremarkable. Hepatobiliary: Diffuse loss of signal intensity in the liver parenchyma on out of phase T1 imaging is consistent with fatty deposition. There is some sparing along the gallbladder fossa. There is no evidence for gallstones, gallbladder wall thickening, or pericholecystic fluid. No intrahepatic or extrahepatic biliary dilation. No choledocholithiasis. Pancreas: No focal mass lesion. No dilatation of the main duct. No peripancreatic edema. Cystic lesion at the tail of pancreas/splenic hilum is obscured by susceptibility artifact from metallic fragment in the left abdomen. Spleen:  Obscured. Adrenals/Urinary Tract: No adrenal nodule or mass. Right kidney unremarkable. Left kidney largely obscured by susceptibility artifact. Stomach/Bowel: Stomach is unremarkable. No gastric wall thickening. No evidence of outlet obstruction. Duodenum is normally positioned as is the ligament of Treitz. No small bowel or colonic dilatation within the visualized abdomen. Vascular/Lymphatic: No abdominal aortic aneurysm. There is no  gastrohepatic or hepatoduodenal ligament lymphadenopathy. No retroperitoneal or mesenteric lymphadenopathy. Other:  No intraperitoneal free fluid. Musculoskeletal: No focal suspicious marrow enhancement within the visualized bony anatomy. IMPRESSION: 1. Hepatic steatosis. 2. Cystic lesion at the tail of pancreas/splenic hilum seen on CT earlier today is obscured by susceptibility artifact from metallic fragment in the left abdomen. 3. No evidence for cholelithiasis.  No biliary dilation. Electronically Signed   By: Misty Stanley M.D.   On: 07/27/2020 18:06   DG Chest Portable 1 View  Result Date: 07/27/2020 CLINICAL DATA:  Midline pain from the chest to the abdomen since last night. EXAM: PORTABLE CHEST 1 VIEW COMPARISON:  02/11/2018. FINDINGS: The heart size and mediastinal contours are within normal limits. Both lungs are clear. No pleural effusion or pneumothorax. The visualized skeletal structures are unremarkable. IMPRESSION: No active disease. Electronically Signed   By: Lajean Manes M.D.   On: 07/27/2020 09:59   MR ABDOMEN MRCP W WO CONTAST  Result Date: 07/27/2020 CLINICAL DATA:  Fatty liver. EXAM: MRI ABDOMEN WITHOUT AND WITH CONTRAST (INCLUDING MRCP) TECHNIQUE: Multiplanar multisequence MR imaging of the abdomen was performed both before and after the administration of intravenous contrast. Heavily T2-weighted images of the biliary and pancreatic ducts were obtained, and three-dimensional MRCP images were rendered by post processing. CONTRAST:  13mL GADAVIST GADOBUTROL 1 MMOL/ML IV SOLN COMPARISON:  CT scan 07/27/2020 FINDINGS: Lower chest: Unremarkable. Hepatobiliary: Diffuse loss of signal intensity in the liver parenchyma on out of phase T1 imaging is consistent with fatty deposition. There is some sparing along the gallbladder fossa. There is no evidence for gallstones, gallbladder wall thickening, or pericholecystic fluid. No intrahepatic or extrahepatic biliary dilation. No  choledocholithiasis. Pancreas: No focal mass lesion. No dilatation of the main duct. No peripancreatic edema. Cystic lesion at the tail of pancreas/splenic hilum is obscured by susceptibility artifact from metallic fragment in the left abdomen. Spleen:  Obscured. Adrenals/Urinary Tract: No adrenal nodule or mass. Right kidney unremarkable. Left kidney largely obscured by susceptibility artifact. Stomach/Bowel:  Stomach is unremarkable. No gastric wall thickening. No evidence of outlet obstruction. Duodenum is normally positioned as is the ligament of Treitz. No small bowel or colonic dilatation within the visualized abdomen. Vascular/Lymphatic: No abdominal aortic aneurysm. There is no gastrohepatic or hepatoduodenal ligament lymphadenopathy. No retroperitoneal or mesenteric lymphadenopathy. Other:  No intraperitoneal free fluid. Musculoskeletal: No focal suspicious marrow enhancement within the visualized bony anatomy. IMPRESSION: 1. Hepatic steatosis. 2. Cystic lesion at the tail of pancreas/splenic hilum seen on CT earlier today is obscured by susceptibility artifact from metallic fragment in the left abdomen. 3. No evidence for cholelithiasis.  No biliary dilation. Electronically Signed   By: Misty Stanley M.D.   On: 07/27/2020 18:06   US Abdomen Limited RUQ (LIVER/GB)  Result Date: 07/27/2020 CLINICAL DATA:  Elevated liver function tests. EXAM: ULTRASOUND ABDOMEN LIMITED RIGHT UPPER QUADRANT COMPARISON:  February 16, 2018. FINDINGS: Gallbladder: No gallstones or wall thickening visualized. No sonographic Murphy sign noted by sonographer. Common bile duct: Diameter: 4 mm which is within normal limits Liver: No focal lesion identified. Increased echogenicity of hepatic parenchyma is noted suggesting hepatic steatosis. Portal vein is patent on color Doppler imaging with normal direction of blood flow towards the liver. Other: None. IMPRESSION: Hepatic steatosis. No other definite abnormality seen in the right  upper quadrant of the abdomen. Electronically Signed   By: Marijo Conception M.D.   On: 07/27/2020 11:41    Medications:   . fluvoxaMINE  100 mg Oral QHS  . folic acid  1 mg Oral Daily  . losartan  100 mg Oral Daily  . methylphenidate  10 mg Oral TID WC  . multivitamin with minerals  1 tablet Oral Daily  . thiamine  100 mg Oral Daily   Or  . thiamine  100 mg Intravenous Daily   Continuous Infusions: . lactated ringers 75 mL/hr at 07/28/20 1154  . phytonadione (VITAMIN K) IV 10 mg (07/28/20 1002)     LOS: 0 days   Geradine Girt  [facility]   How to contact the [facility] Attending or Consulting provider [place] or covering provider during after hours [place], for this patient?  1. Check the care team in [facility] and look for a) attending/consulting [facility] provider listed and b) the [facility] team listed 2. Log into [email or web address] and use [place]'s universal password to access. If you do not have the password, please contact the hospital operator. 3. Locate the [facility] provider you are looking for under [facility] and page to a number that you can be directly reached. 4. If you still have difficulty reaching the provider, please page the [facility] (Director on Call) for the Hospitalists listed on amion for assistance.  07/28/2020, 1:27 PM

## 2020-07-28 NOTE — Progress Notes (Signed)
Daily Rounding Note  07/28/2020, 11:56 AM  LOS: 0 days   SUBJECTIVE:   Chief complaint:   Abdominal pain, elevated LFTs, pancreatic pseudocyst.  Received 25 of fentanyl, 4.5 mg Dilaudid yesterday.  4 mg Dilaudid thus far today.  Abdominal pain better.   Tolerating heart healthy diet but not much appetite.  No nausea.    OBJECTIVE:         Vital signs in last 24 hours:    Temp:  [98.6 F (37 C)-98.7 F (37.1 C)] 98.7 F (37.1 C) (11/30 0450) Pulse Rate:  [75-119] 106 (11/30 1000) Resp:  [10-18] 18 (11/30 0450) BP: (123-153)/(77-100) 146/100 (11/30 1000) SpO2:  [94 %-98 %] 96 % (11/30 0450)   Filed Weights   07/27/20 0858  Weight: 93.4 kg   General: pleasant, comfortable.  Looks diaphoretic.  Florid complexion.    Heart: RRR Chest: clear no labored breathing.   Abdomen: soft, tender w/o guarding across upper abdomen.    Extremities: no CCE.   Neuro/Psych:  Pleasant, cooperative.  Fluid speech.  No tremors.    Intake/Output from previous day: 11/29 0701 - 11/30 0700 In: 2198.5 [P.O.:240; I.V.:958.5; IV Piggyback:1000] Out: 500 [Urine:500]  Intake/Output this shift: Total I/O In: -  Out: 600 [Urine:600]  Lab Results: Recent Labs    07/27/20 0915 07/27/20 1226 07/28/20 0717  WBC 8.0  --  7.9  HGB 18.4* 17.7* 13.7  HCT 53.2* 52.0 40.1  PLT 268  --  157   BMET Recent Labs    07/27/20 0915 07/27/20 1226 07/28/20 0717  NA 135 136 127*  K 4.7 5.7* 3.9  CL 102  --  97*  CO2 17*  --  23  GLUCOSE 165*  --  176*  BUN 29*  --  12  CREATININE 1.59*  --  1.22  CALCIUM 8.9  --  8.4*   LFT Recent Labs    07/27/20 0915 07/28/20 0717  PROT 7.3 5.9*  ALBUMIN 4.1 3.3*  AST 940* 513*  ALT 492* 396*  ALKPHOS 134* 91  BILITOT 2.2* 3.1*  BILIDIR  --  1.4*   PT/INR Recent Labs    07/27/20 1428 07/28/20 0717  LABPROT 20.0* 16.0*  INR 1.8* 1.3*   Hepatitis Panel Recent Labs    07/27/20 1129  07/27/20 1129 07/27/20 1428  HEPBSAG NON REACTIVE  --   --   HCVAB NON REACTIVE   < > NON REACTIVE  HEPAIGM NON REACTIVE   < > NON REACTIVE  HEPBIGM NON REACTIVE   < > NON REACTIVE   < > = values in this interval not displayed.    Studies/Results: CT Abdomen Pelvis W Contrast  Result Date: 07/27/2020 CLINICAL DATA:  Epigastric pain with nausea beginning around midnight last night. EXAM: CT ABDOMEN AND PELVIS WITH CONTRAST TECHNIQUE: Multidetector CT imaging of the abdomen and pelvis was performed using the standard protocol following bolus administration of intravenous contrast. CONTRAST:  183m OMNIPAQUE IOHEXOL 300 MG/ML  SOLN COMPARISON:  02/16/2018 FINDINGS: Lower chest: Clear lung bases.  Heart normal in size. Hepatobiliary: Liver normal in size. Diffuse decreased attenuation of the liver consistent with fatty infiltration. Fatty sparing noted adjacent to the gallbladder and anterior superior liver. No liver mass. Normal gallbladder. No bile duct dilation Pancreas: Cyst lies along the pancreatic tail adjacent to the spleen measuring 2.2 x 1.7 x 2.4 cm. No other pancreatic masses. Duct dilation or active inflammation. Spleen: Normal in size without  focal abnormality. Adrenals/Urinary Tract: No adrenal masses. Kidneys normal in size, orientation and position with symmetric enhancement. Subtle low-attenuation area in the upper pole suggests a cysts common 9 mm in size. No other masses, no stones and no hydronephrosis. Normal ureters. Normal bladder. Stomach/Bowel: Stomach is within normal limits. Appendix appears normal. No evidence of bowel wall thickening, distention, or inflammatory changes. Vascular/Lymphatic: No significant vascular findings are present. No enlarged abdominal or pelvic lymph nodes. Reproductive: Unremarkable. Other: No abdominal wall hernia or abnormality. No abdominopelvic ascites. Musculoskeletal: No fracture or acute finding. No osteoblastic or osteolytic lesions. IMPRESSION:  1. No acute findings.  No evidence of active pancreatitis. 2. 2.4 cm cyst lies between the pancreatic tail and spleen consistent with a pseudocyst given the patient's history of pancreatitis, new since the prior CT. Recommend follow-up CT in 6-12 months to assess for stability. 3. Extensive hepatic steatosis. Electronically Signed   By: Lajean Manes M.D.   On: 07/27/2020 12:09   MR 3D Recon At Scanner  Result Date: 07/27/2020 CLINICAL DATA:  Fatty liver. EXAM: MRI ABDOMEN WITHOUT AND WITH CONTRAST (INCLUDING MRCP) TECHNIQUE: Multiplanar multisequence MR imaging of the abdomen was performed both before and after the administration of intravenous contrast. Heavily T2-weighted images of the biliary and pancreatic ducts were obtained, and three-dimensional MRCP images were rendered by post processing. CONTRAST:  36m GADAVIST GADOBUTROL 1 MMOL/ML IV SOLN COMPARISON:  CT scan 07/27/2020 FINDINGS: Lower chest: Unremarkable. Hepatobiliary: Diffuse loss of signal intensity in the liver parenchyma on out of phase T1 imaging is consistent with fatty deposition. There is some sparing along the gallbladder fossa. There is no evidence for gallstones, gallbladder wall thickening, or pericholecystic fluid. No intrahepatic or extrahepatic biliary dilation. No choledocholithiasis. Pancreas: No focal mass lesion. No dilatation of the main duct. No peripancreatic edema. Cystic lesion at the tail of pancreas/splenic hilum is obscured by susceptibility artifact from metallic fragment in the left abdomen. Spleen:  Obscured. Adrenals/Urinary Tract: No adrenal nodule or mass. Right kidney unremarkable. Left kidney largely obscured by susceptibility artifact. Stomach/Bowel: Stomach is unremarkable. No gastric wall thickening. No evidence of outlet obstruction. Duodenum is normally positioned as is the ligament of Treitz. No small bowel or colonic dilatation within the visualized abdomen. Vascular/Lymphatic: No abdominal aortic  aneurysm. There is no gastrohepatic or hepatoduodenal ligament lymphadenopathy. No retroperitoneal or mesenteric lymphadenopathy. Other:  No intraperitoneal free fluid. Musculoskeletal: No focal suspicious marrow enhancement within the visualized bony anatomy. IMPRESSION: 1. Hepatic steatosis. 2. Cystic lesion at the tail of pancreas/splenic hilum seen on CT earlier today is obscured by susceptibility artifact from metallic fragment in the left abdomen. 3. No evidence for cholelithiasis.  No biliary dilation. Electronically Signed   By: EMisty StanleyM.D.   On: 07/27/2020 18:06   DG Chest Portable 1 View  Result Date: 07/27/2020 CLINICAL DATA:  Midline pain from the chest to the abdomen since last night. EXAM: PORTABLE CHEST 1 VIEW COMPARISON:  02/11/2018. FINDINGS: The heart size and mediastinal contours are within normal limits. Both lungs are clear. No pleural effusion or pneumothorax. The visualized skeletal structures are unremarkable. IMPRESSION: No active disease. Electronically Signed   By: DLajean ManesM.D.   On: 07/27/2020 09:59   MR ABDOMEN MRCP W WO CONTAST  Result Date: 07/27/2020 CLINICAL DATA:  Fatty liver. EXAM: MRI ABDOMEN WITHOUT AND WITH CONTRAST (INCLUDING MRCP) TECHNIQUE: Multiplanar multisequence MR imaging of the abdomen was performed both before and after the administration of intravenous contrast. Heavily T2-weighted images of the  biliary and pancreatic ducts were obtained, and three-dimensional MRCP images were rendered by post processing. CONTRAST:  47m GADAVIST GADOBUTROL 1 MMOL/ML IV SOLN COMPARISON:  CT scan 07/27/2020 FINDINGS: Lower chest: Unremarkable. Hepatobiliary: Diffuse loss of signal intensity in the liver parenchyma on out of phase T1 imaging is consistent with fatty deposition. There is some sparing along the gallbladder fossa. There is no evidence for gallstones, gallbladder wall thickening, or pericholecystic fluid. No intrahepatic or extrahepatic biliary  dilation. No choledocholithiasis. Pancreas: No focal mass lesion. No dilatation of the main duct. No peripancreatic edema. Cystic lesion at the tail of pancreas/splenic hilum is obscured by susceptibility artifact from metallic fragment in the left abdomen. Spleen:  Obscured. Adrenals/Urinary Tract: No adrenal nodule or mass. Right kidney unremarkable. Left kidney largely obscured by susceptibility artifact. Stomach/Bowel: Stomach is unremarkable. No gastric wall thickening. No evidence of outlet obstruction. Duodenum is normally positioned as is the ligament of Treitz. No small bowel or colonic dilatation within the visualized abdomen. Vascular/Lymphatic: No abdominal aortic aneurysm. There is no gastrohepatic or hepatoduodenal ligament lymphadenopathy. No retroperitoneal or mesenteric lymphadenopathy. Other:  No intraperitoneal free fluid. Musculoskeletal: No focal suspicious marrow enhancement within the visualized bony anatomy. IMPRESSION: 1. Hepatic steatosis. 2. Cystic lesion at the tail of pancreas/splenic hilum seen on CT earlier today is obscured by susceptibility artifact from metallic fragment in the left abdomen. 3. No evidence for cholelithiasis.  No biliary dilation. Electronically Signed   By: EMisty StanleyM.D.   On: 07/27/2020 18:06   UKoreaAbdomen Limited RUQ (LIVER/GB)  Result Date: 07/27/2020 CLINICAL DATA:  Elevated liver function tests. EXAM: ULTRASOUND ABDOMEN LIMITED RIGHT UPPER QUADRANT COMPARISON:  February 16, 2018. FINDINGS: Gallbladder: No gallstones or wall thickening visualized. No sonographic Murphy sign noted by sonographer. Common bile duct: Diameter: 4 mm which is within normal limits Liver: No focal lesion identified. Increased echogenicity of hepatic parenchyma is noted suggesting hepatic steatosis. Portal vein is patent on color Doppler imaging with normal direction of blood flow towards the liver. Other: None. IMPRESSION: Hepatic steatosis. No other definite abnormality seen in  the right upper quadrant of the abdomen. Electronically Signed   By: JMarijo ConceptionM.D.   On: 07/27/2020 11:41    Scheduled Meds: . fluvoxaMINE  100 mg Oral QHS  . folic acid  1 mg Oral Daily  . losartan  100 mg Oral Daily  . methylphenidate  10 mg Oral TID WC  . multivitamin with minerals  1 tablet Oral Daily  . thiamine  100 mg Oral Daily   Or  . thiamine  100 mg Intravenous Daily   Continuous Infusions: . lactated ringers 75 mL/hr at 07/28/20 1154  . phytonadione (VITAMIN K) IV 10 mg (07/28/20 1002)   PRN Meds:.HYDROmorphone (DILAUDID) injection, LORazepam **OR** LORazepam, ondansetron **OR** ondansetron (ZOFRAN) IV, traZODone   ASSESMENT:   *   Abdominal pain, elevated LFTs, pancreatic pseudocyst at TOP.   T bili 2.2 >> 3.1.  Alk phos 134 >> 91.  AST/ALT 940/492 >> 513/396. Acute pancreatitis with concern for small area of necrosis attributed to alcohol in 01/2018. 07/27/2020 MRCP, MRI abdomen:   Hepatic steatosis.  Cystic lesion at TOPS/splenic hilum obscured by artifact from nearby metal fragment.  No cholelithiasis. LR at 75 mL/hour.    *    Extensive hepatic steatosis per CT.  Improving LFTs.  Multiple hepatitis serologies all negative.  ANA, smooth muscle antibody, immunoglobulin QN/A/B/G/M levels ordered by admitting physician, pending. Patient drinks regularly, perhaps more than  he admits to which is 2 beers daily, more recently over the Thanksgiving holiday.  *   Coagulopathy.  INR 1.8 >> vitamin K d 1 of 3 >> 1.3. Platelets normal.  *    AKI, resolved.  Good urine output  *    Hyponatremia.  Na 127.  *    Panic disorder.  Multiple meds in place.     PLAN   *   Leave the LR at 75/hour.  Continue HH diet as tolerated.  Continue CIWA    Azucena Freed  07/28/2020, 11:56 AM Phone [phone]

## 2020-07-28 NOTE — Progress Notes (Signed)
Initial Nutrition Assessment  DOCUMENTATION CODES:   Obesity unspecified  INTERVENTION:  Provide Ensure Enlive po BID, each supplement provides 350 kcal and 20 grams of protein  Encourage adequate PO intake.   NUTRITION DIAGNOSIS:   Increased nutrient needs related to acute illness (hepatitis) as evidenced by estimated needs.  GOAL:   Patient will meet greater than or equal to 90% of their needs  MONITOR:   PO intake, Supplement acceptance, Skin, Weight trends, Labs, I & O's  REASON FOR ASSESSMENT:   Malnutrition Screening Tool    ASSESSMENT:   39 y.o. male with medical history significant for hypertension, history of acute pancreatitis, alcohol abuse presents with abdominal pain.  Found to have acute hepatitis  Pt unavailable during attempted time of contact. RD unable to obtain pt nutrition history at this time. Per MD, pt able to tolerate his diet po, however pt with abdominal pains. RD to order nutritional supplements to aid in caloric and protein needs. Unable to complete Nutrition-Focused physical exam at this time.   Labs and medications reviewed.   Diet Order:   Diet Order            Diet Heart Room service appropriate? Yes; Fluid consistency: Thin  Diet effective now                 EDUCATION NEEDS:   Not appropriate for education at this time  Skin:  Skin Assessment: Reviewed RN Assessment  Last BM:  Unknown  Height:   Ht Readings from Last 1 Encounters:  07/27/20 5\' 8"  (1.727 m)    Weight:   Wt Readings from Last 1 Encounters:  07/27/20 93.4 kg   BMI:  Body mass index is 31.32 kg/m.  Estimated Nutritional Needs:   Kcal:  2200-2400  Protein:  115-130 grams  Fluid:  >/= 2 L/day  Corrin Parker, MS, RD, LDN RD pager number/after hours weekend pager number on Am[employer].

## 2020-07-29 DIAGNOSIS — R945 Abnormal results of liver function studies: Secondary | ICD-10-CM | POA: Diagnosis not present

## 2020-07-29 DIAGNOSIS — B179 Acute viral hepatitis, unspecified: Secondary | ICD-10-CM | POA: Diagnosis not present

## 2020-07-29 DIAGNOSIS — F1020 Alcohol dependence, uncomplicated: Secondary | ICD-10-CM | POA: Diagnosis not present

## 2020-07-29 DIAGNOSIS — K760 Fatty (change of) liver, not elsewhere classified: Secondary | ICD-10-CM | POA: Diagnosis not present

## 2020-07-29 LAB — COMPREHENSIVE METABOLIC PANEL
ALT: 249 U/L — ABNORMAL HIGH (ref 0–44)
AST: 232 U/L — ABNORMAL HIGH (ref 15–41)
Albumin: 3 g/dL — ABNORMAL LOW (ref 3.5–5.0)
Alkaline Phosphatase: 81 U/L (ref 38–126)
Anion gap: 11 (ref 5–15)
BUN: 7 mg/dL (ref 6–20)
CO2: 25 mmol/L (ref 22–32)
Calcium: 8.7 mg/dL — ABNORMAL LOW (ref 8.9–10.3)
Chloride: 98 mmol/L (ref 98–111)
Creatinine, Ser: 1.27 mg/dL — ABNORMAL HIGH (ref 0.61–1.24)
GFR, Estimated: >60 mL/min (ref >60)
Glucose, Bld: 185 mg/dL — ABNORMAL HIGH (ref 70–99)
Potassium: 4 mmol/L (ref 3.5–5.1)
Sodium: 134 mmol/L — ABNORMAL LOW (ref 135–145)
Total Bilirubin: 2.9 mg/dL — ABNORMAL HIGH (ref 0.3–1.2)
Total Protein: 6 g/dL — ABNORMAL LOW (ref 6.5–8.1)

## 2020-07-29 LAB — CBC
HCT: 39.3 % (ref 39.0–52.0)
Hemoglobin: 13.8 g/dL (ref 13.0–17.0)
MCH: 31.8 pg (ref 26.0–34.0)
MCHC: 35.1 g/dL (ref 30.0–36.0)
MCV: 90.6 fL (ref 80.0–100.0)
Platelets: 116 10*3/uL — ABNORMAL LOW (ref 150–400)
RBC: 4.34 MIL/uL (ref 4.22–5.81)
RDW: 11.9 % (ref 11.5–15.5)
WBC: 9.9 10*3/uL (ref 4.0–10.5)
nRBC: 0 % (ref 0.0–0.2)

## 2020-07-29 LAB — HEPATITIS B SURFACE ANTIBODY, QUANTITATIVE: Hep B S AB Quant (Post): <3.1 m[IU]/mL — ABNORMAL LOW (ref >9.9)

## 2020-07-29 MED ORDER — PANTOPRAZOLE SODIUM 40 MG PO TBEC
40.0000 mg | DELAYED_RELEASE_TABLET | Freq: Two times a day (BID) | ORAL | Status: DC
  Administered 2020-07-29: 40 mg via ORAL
  Filled 2020-07-29: qty 1

## 2020-07-29 NOTE — H&P (View-Only) (Signed)
Gastroenterology Inpatient Follow-up Note   PATIENT IDENTIFICATION  Glenn [name] is a 39 y.o. male with a pmh significant for alcohol use disorder, previous necrotizing pancreatitis, GERD, MDD, anxiety, hypertension, pancreatic cyst.  The patient was admitted with abdominal pain and abnormal LFTs and anorexia. Hospital Day: 3  SUBJECTIVE  The patient is evaluated this morning.  He continues to have abdominal pain rated as 4-5 out of 10 but this is improving day by day.  He actually is having some slight improvement in his appetite/anorexia and was able to tolerate some small breakfast this morning.  LFT pattern continues to improve showing improvement and transferases and bilirubin although they have not normalized.  Laboratories for further work-up remain pending for some of these but no acute common viral hepatitis has occurred.  Patient has some mild dyspepsia still.  He has never had an upper endoscopy.  He met with social work today but is not clear that he wants any further help with alcohol cessation.   OBJECTIVE  Scheduled Inpatient Medications:   amLODipine  5 mg Oral Daily   feeding supplement  237 mL Oral BID BM   fluvoxaMINE  100 mg Oral QHS   folic acid  1 mg Oral Daily   methylphenidate  10 mg Oral TID WC   multivitamin with minerals  1 tablet Oral Daily   thiamine  100 mg Oral Daily   Or   thiamine  100 mg Intravenous Daily   Continuous Inpatient Infusions:   lactated ringers 75 mL/hr at 07/29/20 0956   phytonadione (VITAMIN K) IV 10 mg (07/29/20 0958)   PRN Inpatient Medications: diazepam, HYDROmorphone (DILAUDID) injection, labetalol, LORazepam **OR** LORazepam, ondansetron **OR** ondansetron (ZOFRAN) IV, traZODone   Physical Examination  Temp:  [98.4 F (36.9 C)-99.4 F (37.4 C)] 98.9 F (37.2 C) (12/01 1241) Pulse Rate:  [96-109] 109 (12/01 1241) Resp:  [16-18] 16 (12/01 1241) BP: (126-133)/(62-93) 131/89 (12/01 1241) SpO2:  [95 %-98 %] 96 %  (12/01 1241) Temp (24hrs), Avg:98.9 F (37.2 C), Min:98.4 F (36.9 C), Max:99.4 F (37.4 C)  Weight: 93.4 kg GEN: NAD, appears stated age, doesn't appear chronically ill PSYCH: Cooperative, without pressured speech EYE: Sclerae icteric ENT: MMM CV: RR without R/Gs  RESP: No audible wheezing GI: NABS, soft, mildly protuberant, tenderness to palpation in right upper quadrant and midepigastrium upon deep palpation, volitional guarding without rebound, unable to appreciate significant hepatosplenomegaly MSK/EXT: No lower extremity edema SKIN: No jaundice, spider angiomata present on upper thorax NEURO:  Alert & Oriented x 3, no focal deficits, no evidence of asterixis   Review of Data   Laboratory Studies   Recent Labs  Lab 07/27/20 1429 07/28/20 0717 07/29/20 0254  NA  --    < > 134*  K  --    < > 4.0  CL  --    < > 98  CO2  --    < > 25  BUN  --    < > 7  CREATININE  --    < > 1.27*  GLUCOSE  --    < > 185*  CALCIUM  --    < > 8.7*  MG 1.9  --   --   PHOS 4.2  --   --    < > = values in this interval not displayed.   Recent Labs  Lab 07/29/20 0254  AST 232*  ALT 249*  ALKPHOS 81    Recent Labs  Lab 07/27/20 0915 07/27/20  1226 07/28/20 0717 07/28/20 0717 07/29/20 0254  WBC 8.0   < > 7.9   < > 9.9  HGB 18.4*   < > 13.7   < > 13.8  HCT 53.2*   < > 40.1   < > 39.3  PLT 268  --  157  --  116*   < > = values in this interval not displayed.   Recent Labs  Lab 07/27/20 1428 07/28/20 0717  APTT 33  --   INR 1.8* 1.3*   Imaging Studies  MR 3D Recon At Scanner  Result Date: 07/27/2020 CLINICAL DATA:  Fatty liver. EXAM: MRI ABDOMEN WITHOUT AND WITH CONTRAST (INCLUDING MRCP) TECHNIQUE: Multiplanar multisequence MR imaging of the abdomen was performed both before and after the administration of intravenous contrast. Heavily T2-weighted images of the biliary and pancreatic ducts were obtained, and three-dimensional MRCP images were rendered by post processing.  CONTRAST:  50m GADAVIST GADOBUTROL 1 MMOL/ML IV SOLN COMPARISON:  CT scan 07/27/2020 FINDINGS: Lower chest: Unremarkable. Hepatobiliary: Diffuse loss of signal intensity in the liver parenchyma on out of phase T1 imaging is consistent with fatty deposition. There is some sparing along the gallbladder fossa. There is no evidence for gallstones, gallbladder wall thickening, or pericholecystic fluid. No intrahepatic or extrahepatic biliary dilation. No choledocholithiasis. Pancreas: No focal mass lesion. No dilatation of the main duct. No peripancreatic edema. Cystic lesion at the tail of pancreas/splenic hilum is obscured by susceptibility artifact from metallic fragment in the left abdomen. Spleen:  Obscured. Adrenals/Urinary Tract: No adrenal nodule or mass. Right kidney unremarkable. Left kidney largely obscured by susceptibility artifact. Stomach/Bowel: Stomach is unremarkable. No gastric wall thickening. No evidence of outlet obstruction. Duodenum is normally positioned as is the ligament of Treitz. No small bowel or colonic dilatation within the visualized abdomen. Vascular/Lymphatic: No abdominal aortic aneurysm. There is no gastrohepatic or hepatoduodenal ligament lymphadenopathy. No retroperitoneal or mesenteric lymphadenopathy. Other:  No intraperitoneal free fluid. Musculoskeletal: No focal suspicious marrow enhancement within the visualized bony anatomy. IMPRESSION: 1. Hepatic steatosis. 2. Cystic lesion at the tail of pancreas/splenic hilum seen on CT earlier today is obscured by susceptibility artifact from metallic fragment in the left abdomen. 3. No evidence for cholelithiasis.  No biliary dilation. Electronically Signed   By: EMisty StanleyM.D.   On: 07/27/2020 18:06   MR ABDOMEN MRCP W WO CONTAST  Result Date: 07/27/2020 CLINICAL DATA:  Fatty liver. EXAM: MRI ABDOMEN WITHOUT AND WITH CONTRAST (INCLUDING MRCP) TECHNIQUE: Multiplanar multisequence MR imaging of the abdomen was performed both  before and after the administration of intravenous contrast. Heavily T2-weighted images of the biliary and pancreatic ducts were obtained, and three-dimensional MRCP images were rendered by post processing. CONTRAST:  915mGADAVIST GADOBUTROL 1 MMOL/ML IV SOLN COMPARISON:  CT scan 07/27/2020 FINDINGS: Lower chest: Unremarkable. Hepatobiliary: Diffuse loss of signal intensity in the liver parenchyma on out of phase T1 imaging is consistent with fatty deposition. There is some sparing along the gallbladder fossa. There is no evidence for gallstones, gallbladder wall thickening, or pericholecystic fluid. No intrahepatic or extrahepatic biliary dilation. No choledocholithiasis. Pancreas: No focal mass lesion. No dilatation of the main duct. No peripancreatic edema. Cystic lesion at the tail of pancreas/splenic hilum is obscured by susceptibility artifact from metallic fragment in the left abdomen. Spleen:  Obscured. Adrenals/Urinary Tract: No adrenal nodule or mass. Right kidney unremarkable. Left kidney largely obscured by susceptibility artifact. Stomach/Bowel: Stomach is unremarkable. No gastric wall thickening. No evidence of outlet obstruction. Duodenum is  normally positioned as is the ligament of Treitz. No small bowel or colonic dilatation within the visualized abdomen. Vascular/Lymphatic: No abdominal aortic aneurysm. There is no gastrohepatic or hepatoduodenal ligament lymphadenopathy. No retroperitoneal or mesenteric lymphadenopathy. Other:  No intraperitoneal free fluid. Musculoskeletal: No focal suspicious marrow enhancement within the visualized bony anatomy. IMPRESSION: 1. Hepatic steatosis. 2. Cystic lesion at the tail of pancreas/splenic hilum seen on CT earlier today is obscured by susceptibility artifact from metallic fragment in the left abdomen. 3. No evidence for cholelithiasis.  No biliary dilation. Electronically Signed   By: Misty Stanley M.D.   On: 07/27/2020 18:06     ASSESSMENT  Mr. Ducre  is a 39 y.o. male  with a pmh significant for alcohol use disorder, previous necrotizing pancreatitis, GERD, MDD, anxiety, hypertension, pancreatic cyst.  The patient was admitted with abdominal pain and abnormal LFTs and anorexia.  The patient is hemodynamically stable.  Clinically he seems to be having some slight improvement.  Is not clear the underlying etiology of his symptomatology and findings of significant abnormal LFTs.  Certainly there is some contribution from his underlying alcohol use but this is not completely alcoholic hepatitis.  Still has a clinical picture that is very suggestive of biliary colic but he has no evidence of gallstone disease on his imaging.  I think based on the persistence of symptoms although he is having some improvement today, he will benefit from an upper endoscopy with endoscopic ultrasound.  I will have an opportunity to also rule out chronic pancreatitis as well as to evaluate the pancreatic cyst that is likely a issue from his prior necrotizing pancreatitis 3 years ago (in the setting of his significant alcohol use disorder at that time).  I do not plan to likely sample the cyst unless something were to look abnormal.  If the patient is feeling very well tomorrow we may not need to pursue this otherwise we will plan to try and have this done on Thursday as an EGD/EUS.  The risks of an EUS including intestinal perforation, bleeding, infection, aspiration, and medication effects were discussed as was the possibility it may not give a definitive diagnosis if a biopsy is performed.  When a biopsy of the pancreas is done as part of the EUS, there is an additional risk of pancreatitis at the rate of about 1-2%.  It was explained that procedure related pancreatitis is typically mild, although it can be severe and even life threatening, which is why we do not perform random pancreatic biopsies and only biopsy a lesion/area we feel is concerning enough to warrant the risk.  The  risks and benefits of endoscopic evaluation were discussed with the patient; these include but are not limited to the risk of perforation, infection, bleeding, missed lesions, lack of diagnosis, severe illness requiring hospitalization, as well as anesthesia and sedation related illnesses.  The patient is agreeable to proceed.   PLAN/RECOMMENDATIONS  Protonix 40 mg twice daily Carafate can be added as needed depending on how he does over the course the next 24 hours Tentative EGD/EUS on Thursday (pending time and availability with anesthesia) -N.p.o. at midnight -If patient is doing very well we can consider holding on this Trend LFTs and INR tomorrow    Please page/call with questions or concerns.   Justice Britain, MD [facility] Office # [phone]    LOS: 1 day  Irving Copas  07/29/2020, 2:41 PM

## 2020-07-29 NOTE — Progress Notes (Signed)
CSW met with pt for substance use consult. Pt expresses he is unconcerned with his alcohol use. He states he is not wanting any treatment or resources. 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 At end of consult, pt does accept pamphlet of local substance use resources.

## 2020-07-29 NOTE — Progress Notes (Signed)
PROGRESS NOTE    Glenn [name]  GMW:[id number] DOB: 1981/02/08 DOA: 07/27/2020 PCP: Marin Olp, MD    Brief Narrative:  Glenn [name] is an 39 y.o. male with medical history significant forhypertension, history of acute pancreatitis, panic disorder, depression, anxiety, history of alcohol abuse presenting to the emergency department for chief concerns of acute onset abdominal pain and abnormal LFTs and anorexia.  12/1-trying a sandwich today.  Consultants:   GI  Procedures:   Antimicrobials:      Subjective: Pain controlled with pain management.  Going to give a sample to try.  No nausea or vomiting.  Objective: Vitals:   07/29/20 0048 07/29/20 0522 07/29/20 0600 07/29/20 1241  BP: (!) 131/93 (!) 127/92 127/62 131/89  Pulse: (!) 106 96 96 (!) 109  Resp: 18 18  16   Temp: 98.8 F (37.1 C) 98.4 F (36.9 C)  98.9 F (37.2 C)  TempSrc: Oral Oral  Oral  SpO2: 95% 98%  96%  Weight:      Height:        Intake/Output Summary (Last 24 hours) at 07/29/2020 1712 Last data filed at 07/28/2020 2146 Gross per 24 hour  Intake 240 ml  Output --  Net 240 ml   Filed Weights   07/27/20 0858  Weight: 93.4 kg    Examination:  General exam: Appears calm and comfortable  Respiratory system: Clear to auscultation. Respiratory effort normal. Cardiovascular system: S1 & S2 heard, RRR. No JVD, murmurs, rubs, gallops or clicks. No pedal edema. Gastrointestinal system: Abdomen is nondistended, soft and nontender. No organomegaly or masses felt. Normal bowel sounds heard. Central nervous system: Alert and oriented. No focal neurological deficits. Extremities: Symmetric 5 x 5 power. Skin: No rashes, lesions or ulcers Psychiatry: Judgement and insight appear normal. Mood & affect appropriate.     Data Reviewed: I have personally reviewed following labs and imaging studies  CBC: Recent Labs  Lab 07/27/20 0915 07/27/20 1226 07/28/20 0717 07/29/20 0254  WBC 8.0  --  7.9  9.9  NEUTROABS 5.4  --   --   --   HGB 18.4* 17.7* 13.7 13.8  HCT 53.2* 52.0 40.1 39.3  MCV 90.6  --  91.6 90.6  PLT 268  --  157 440*   Basic Metabolic Panel: Recent Labs  Lab 07/27/20 0915 07/27/20 1226 07/27/20 1429 07/28/20 0717 07/29/20 0254  NA 135 136  --  127* 134*  K 4.7 5.7*  --  3.9 4.0  CL 102  --   --  97* 98  CO2 17*  --   --  23 25  GLUCOSE 165*  --   --  176* 185*  BUN 29*  --   --  12 7  CREATININE 1.59*  --   --  1.22 1.27*  CALCIUM 8.9  --   --  8.4* 8.7*  MG  --   --  1.9  --   --   PHOS  --   --  4.2  --   --    GFR: Estimated Creatinine Clearance: 86.6 mL/min (A) (by C-G formula based on SCr of 1.27 mg/dL (H)). Liver Function Tests: Recent Labs  Lab 07/27/20 0915 07/28/20 0717 07/29/20 0254  AST 940* 513* 232*  ALT 492* 396* 249*  ALKPHOS 134* 91 81  BILITOT 2.2* 3.1* 2.9*  PROT 7.3 5.9* 6.0*  ALBUMIN 4.1 3.3* 3.0*   Recent Labs  Lab 07/27/20 0915  LIPASE 56*   No results for  input(s): AMMONIA in the last 168 hours. Coagulation Profile: Recent Labs  Lab 07/27/20 1428 07/28/20 0717  INR 1.8* 1.3*   Cardiac Enzymes: Recent Labs  Lab 07/28/20 0717  CKTOTAL 62   BNP (last 3 results) No results for input(s): PROBNP in the last 8760 hours. HbA1C: No results for input(s): HGBA1C in the last 72 hours. CBG: No results for input(s): GLUCAP in the last 168 hours. Lipid Profile: No results for input(s): CHOL, HDL, LDLCALC, TRIG, CHOLHDL, LDLDIRECT in the last 72 hours. Thyroid Function Tests: Recent Labs    07/27/20 1429  TSH 1.802   Anemia Panel: Recent Labs    07/28/20 0717  FERRITIN 6,880*  TIBC 193*  IRON 187*   Sepsis Labs: No results for input(s): PROCALCITON, LATICACIDVEN in the last 168 hours.  Recent Results (from the past 240 hour(s))  Resp Panel by RT-PCR (Flu A&B, Covid) Nasopharyngeal Swab     Status: None   Collection Time: 07/27/20 11:29 AM   Specimen: Nasopharyngeal Swab; Nasopharyngeal(NP) swabs in vial  transport medium  Result Value Ref Range Status   SARS Coronavirus 2 by RT PCR NEGATIVE NEGATIVE Final    Comment: (NOTE) SARS-CoV-2 target nucleic acids are NOT DETECTED.  The SARS-CoV-2 RNA is generally detectable in upper respiratory specimens during the acute phase of infection. The lowest concentration of SARS-CoV-2 viral copies this assay can detect is 138 copies/mL. A negative result does not preclude SARS-Cov-2 infection and should not be used as the sole basis for treatment or other patient management decisions. A negative result may occur with  improper specimen collection/handling, submission of specimen other than nasopharyngeal swab, presence of viral mutation(s) within the areas targeted by this assay, and inadequate number of viral copies(<138 copies/mL). A negative result must be combined with clinical observations, patient history, and epidemiological information. The expected result is Negative.  Fact Sheet for Patients:  EntrepreneurPulse.com.au  Fact Sheet for Healthcare Providers:  IncredibleEmployment.be  This test is no t yet approved or cleared by the [place] FDA and  has been authorized for detection and/or diagnosis of SARS-CoV-2 by FDA under an Emergency Use Authorization (EUA). This EUA will remain  in effect (meaning this test can be used) for the duration of the COVID-19 declaration under Section 564(b)(1) of the Act, 21 U.S.C.section 360bbb-3(b)(1), unless the authorization is terminated  or revoked sooner.       Influenza A by PCR NEGATIVE NEGATIVE Final   Influenza B by PCR NEGATIVE NEGATIVE Final    Comment: (NOTE) The Xpert Xpress SARS-CoV-2/FLU/RSV plus assay is intended as an aid in the diagnosis of influenza from Nasopharyngeal swab specimens and should not be used as a sole basis for treatment. Nasal washings and aspirates are unacceptable for Xpert Xpress SARS-CoV-2/FLU/RSV testing.  Fact  Sheet for Patients: EntrepreneurPulse.com.au  Fact Sheet for Healthcare Providers: IncredibleEmployment.be  This test is not yet approved or cleared by the [place] FDA and has been authorized for detection and/or diagnosis of SARS-CoV-2 by FDA under an Emergency Use Authorization (EUA). This EUA will remain in effect (meaning this test can be used) for the duration of the COVID-19 declaration under Section 564(b)(1) of the Act, 21 U.S.C. section 360bbb-3(b)(1), unless the authorization is terminated or revoked.  Performed at [facility], [place] [address] [place], [place] [address]          Radiology Studies: MR 3D Recon At Scanner  Result Date: 07/27/2020 CLINICAL DATA:  Fatty liver. EXAM: MRI ABDOMEN WITHOUT AND  WITH CONTRAST (INCLUDING MRCP) TECHNIQUE: Multiplanar multisequence MR imaging of the abdomen was performed both before and after the administration of intravenous contrast. Heavily T2-weighted images of the biliary and pancreatic ducts were obtained, and three-dimensional MRCP images were rendered by post processing. CONTRAST:  6mL GADAVIST GADOBUTROL 1 MMOL/ML IV SOLN COMPARISON:  CT scan 07/27/2020 FINDINGS: Lower chest: Unremarkable. Hepatobiliary: Diffuse loss of signal intensity in the liver parenchyma on out of phase T1 imaging is consistent with fatty deposition. There is some sparing along the gallbladder fossa. There is no evidence for gallstones, gallbladder wall thickening, or pericholecystic fluid. No intrahepatic or extrahepatic biliary dilation. No choledocholithiasis. Pancreas: No focal mass lesion. No dilatation of the main duct. No peripancreatic edema. Cystic lesion at the tail of pancreas/splenic hilum is obscured by susceptibility artifact from metallic fragment in the left abdomen. Spleen:  Obscured. Adrenals/Urinary Tract: No adrenal nodule or mass. Right kidney unremarkable. Left kidney largely obscured by  susceptibility artifact. Stomach/Bowel: Stomach is unremarkable. No gastric wall thickening. No evidence of outlet obstruction. Duodenum is normally positioned as is the ligament of Treitz. No small bowel or colonic dilatation within the visualized abdomen. Vascular/Lymphatic: No abdominal aortic aneurysm. There is no gastrohepatic or hepatoduodenal ligament lymphadenopathy. No retroperitoneal or mesenteric lymphadenopathy. Other:  No intraperitoneal free fluid. Musculoskeletal: No focal suspicious marrow enhancement within the visualized bony anatomy. IMPRESSION: 1. Hepatic steatosis. 2. Cystic lesion at the tail of pancreas/splenic hilum seen on CT earlier today is obscured by susceptibility artifact from metallic fragment in the left abdomen. 3. No evidence for cholelithiasis.  No biliary dilation. Electronically Signed   By: Misty Stanley M.D.   On: 07/27/2020 18:06   MR ABDOMEN MRCP W WO CONTAST  Result Date: 07/27/2020 CLINICAL DATA:  Fatty liver. EXAM: MRI ABDOMEN WITHOUT AND WITH CONTRAST (INCLUDING MRCP) TECHNIQUE: Multiplanar multisequence MR imaging of the abdomen was performed both before and after the administration of intravenous contrast. Heavily T2-weighted images of the biliary and pancreatic ducts were obtained, and three-dimensional MRCP images were rendered by post processing. CONTRAST:  72mL GADAVIST GADOBUTROL 1 MMOL/ML IV SOLN COMPARISON:  CT scan 07/27/2020 FINDINGS: Lower chest: Unremarkable. Hepatobiliary: Diffuse loss of signal intensity in the liver parenchyma on out of phase T1 imaging is consistent with fatty deposition. There is some sparing along the gallbladder fossa. There is no evidence for gallstones, gallbladder wall thickening, or pericholecystic fluid. No intrahepatic or extrahepatic biliary dilation. No choledocholithiasis. Pancreas: No focal mass lesion. No dilatation of the main duct. No peripancreatic edema. Cystic lesion at the tail of pancreas/splenic hilum is  obscured by susceptibility artifact from metallic fragment in the left abdomen. Spleen:  Obscured. Adrenals/Urinary Tract: No adrenal nodule or mass. Right kidney unremarkable. Left kidney largely obscured by susceptibility artifact. Stomach/Bowel: Stomach is unremarkable. No gastric wall thickening. No evidence of outlet obstruction. Duodenum is normally positioned as is the ligament of Treitz. No small bowel or colonic dilatation within the visualized abdomen. Vascular/Lymphatic: No abdominal aortic aneurysm. There is no gastrohepatic or hepatoduodenal ligament lymphadenopathy. No retroperitoneal or mesenteric lymphadenopathy. Other:  No intraperitoneal free fluid. Musculoskeletal: No focal suspicious marrow enhancement within the visualized bony anatomy. IMPRESSION: 1. Hepatic steatosis. 2. Cystic lesion at the tail of pancreas/splenic hilum seen on CT earlier today is obscured by susceptibility artifact from metallic fragment in the left abdomen. 3. No evidence for cholelithiasis.  No biliary dilation. Electronically Signed   By: Misty Stanley M.D.   On: 07/27/2020 18:06  Scheduled Meds: . amLODipine  5 mg Oral Daily  . feeding supplement  237 mL Oral BID BM  . fluvoxaMINE  100 mg Oral QHS  . folic acid  1 mg Oral Daily  . methylphenidate  10 mg Oral TID WC  . multivitamin with minerals  1 tablet Oral Daily  . pantoprazole  40 mg Oral BID AC  . thiamine  100 mg Oral Daily   Or  . thiamine  100 mg Intravenous Daily   Continuous Infusions: . lactated ringers 75 mL/hr at 07/29/20 0956  . phytonadione (VITAMIN K) IV 10 mg (07/29/20 3016)    Assessment & Plan:   Principal Problem:   Acute hepatitis Active Problems:   Panic disorder   Tobacco abuse   Hepatic steatosis   Alcohol use disorder, moderate, in controlled environment (HCC)   GAD (generalized anxiety disorder)   Mood disorder with depressive features due to general medical condition   Chronic fatigue   Abdominal  pain  1.Abn LFT/abd pain- imaging without gallstone dz. Gi following. Plan for upper EGD with endoscopic US-r/o chronic pancreatitis and evaluate pancreatic cyst Patient feels well tomorrow GI we will not need to pursue this otherwise plan is to try to have this done on Thursday. NPO at [place] LFT improving  Trend LFT, INR in am PPI bid Carafate depending how he does the next 24 hrs.  2.  Primary hypertension-continue amlodipine  3.  Major depressive disorder-currently in remission.  Resume Fluvoxamine  4.Insomnia-resume trazodone  5.panic ds/anxiety- hold valium Ativan prn  6.hyponatremia- na on admission 127, improved with ivf.   7.hx/o alcohol abuse On CIWA protocal Continue folate and thiamine   DVT prophylaxis: scd Code Status: Full Family Communication: None at bedside  Status is: Inpatient  Remains inpatient appropriate because:Ongoing diagnostic testing needed not appropriate for outpatient work up   Dispo: The patient is from: Home              Anticipated d/c is to: Home              Anticipated d/c date is: 2 days              Patient currently is not medically stable to d/c.            LOS: 1 day   Time spent: 45 minutes with more than 50% on [place], MD [facility] Pager [phone]  If 7PM-7AM, please contact night-coverage [email or web address] Password [facility] 07/29/2020, 5:12 PM

## 2020-07-29 NOTE — Progress Notes (Signed)
Gastroenterology Inpatient Follow-up Note   PATIENT IDENTIFICATION  Glenn [name] is a 39 y.o. male with a pmh significant for alcohol use disorder, previous necrotizing pancreatitis, GERD, MDD, anxiety, hypertension, pancreatic cyst.  The patient was admitted with abdominal pain and abnormal LFTs and anorexia. Hospital Day: 3  SUBJECTIVE  The patient is evaluated this morning.  He continues to have abdominal pain rated as 4-5 out of 10 but this is improving day by day.  He actually is having some slight improvement in his appetite/anorexia and was able to tolerate some small breakfast this morning.  LFT pattern continues to improve showing improvement and transferases and bilirubin although they have not normalized.  Laboratories for further work-up remain pending for some of these but no acute common viral hepatitis has occurred.  Patient has some mild dyspepsia still.  He has never had an upper endoscopy.  He met with social work today but is not clear that he wants any further help with alcohol cessation.   OBJECTIVE  Scheduled Inpatient Medications:  . amLODipine  5 mg Oral Daily  . feeding supplement  237 mL Oral BID BM  . fluvoxaMINE  100 mg Oral QHS  . folic acid  1 mg Oral Daily  . methylphenidate  10 mg Oral TID WC  . multivitamin with minerals  1 tablet Oral Daily  . thiamine  100 mg Oral Daily   Or  . thiamine  100 mg Intravenous Daily   Continuous Inpatient Infusions:  . lactated ringers 75 mL/hr at 07/29/20 0956  . phytonadione (VITAMIN K) IV 10 mg (07/29/20 0958)   PRN Inpatient Medications: diazepam, HYDROmorphone (DILAUDID) injection, labetalol, LORazepam **OR** LORazepam, ondansetron **OR** ondansetron (ZOFRAN) IV, traZODone   Physical Examination  Temp:  [98.4 F (36.9 C)-99.4 F (37.4 C)] 98.9 F (37.2 C) (12/01 1241) Pulse Rate:  [96-109] 109 (12/01 1241) Resp:  [16-18] 16 (12/01 1241) BP: (126-133)/(62-93) 131/89 (12/01 1241) SpO2:  [95 %-98 %] 96 %  (12/01 1241) Temp (24hrs), Avg:98.9 F (37.2 C), Min:98.4 F (36.9 C), Max:99.4 F (37.4 C)  Weight: 93.4 kg GEN: NAD, appears stated age, doesn't appear chronically ill PSYCH: Cooperative, without pressured speech EYE: Sclerae icteric ENT: MMM CV: RR without R/Gs  RESP: No audible wheezing GI: NABS, soft, mildly protuberant, tenderness to palpation in right upper quadrant and midepigastrium upon deep palpation, volitional guarding without rebound, unable to appreciate significant hepatosplenomegaly MSK/EXT: No lower extremity edema SKIN: No jaundice, spider angiomata present on upper thorax NEURO:  Alert & Oriented x 3, no focal deficits, no evidence of asterixis   Review of Data   Laboratory Studies   Recent Labs  Lab 07/27/20 1429 07/28/20 0717 07/29/20 0254  NA  --    < > 134*  K  --    < > 4.0  CL  --    < > 98  CO2  --    < > 25  BUN  --    < > 7  CREATININE  --    < > 1.27*  GLUCOSE  --    < > 185*  CALCIUM  --    < > 8.7*  MG 1.9  --   --   PHOS 4.2  --   --    < > = values in this interval not displayed.   Recent Labs  Lab 07/29/20 0254  AST 232*  ALT 249*  ALKPHOS 81    Recent Labs  Lab 07/27/20 0915 07/27/20  1226 07/28/20 0717 07/28/20 0717 07/29/20 0254  WBC 8.0   < > 7.9   < > 9.9  HGB 18.4*   < > 13.7   < > 13.8  HCT 53.2*   < > 40.1   < > 39.3  PLT 268  --  157  --  116*   < > = values in this interval not displayed.   Recent Labs  Lab 07/27/20 1428 07/28/20 0717  APTT 33  --   INR 1.8* 1.3*   Imaging Studies  MR 3D Recon At Scanner  Result Date: 07/27/2020 CLINICAL DATA:  Fatty liver. EXAM: MRI ABDOMEN WITHOUT AND WITH CONTRAST (INCLUDING MRCP) TECHNIQUE: Multiplanar multisequence MR imaging of the abdomen was performed both before and after the administration of intravenous contrast. Heavily T2-weighted images of the biliary and pancreatic ducts were obtained, and three-dimensional MRCP images were rendered by post processing.  CONTRAST:  70m GADAVIST GADOBUTROL 1 MMOL/ML IV SOLN COMPARISON:  CT scan 07/27/2020 FINDINGS: Lower chest: Unremarkable. Hepatobiliary: Diffuse loss of signal intensity in the liver parenchyma on out of phase T1 imaging is consistent with fatty deposition. There is some sparing along the gallbladder fossa. There is no evidence for gallstones, gallbladder wall thickening, or pericholecystic fluid. No intrahepatic or extrahepatic biliary dilation. No choledocholithiasis. Pancreas: No focal mass lesion. No dilatation of the main duct. No peripancreatic edema. Cystic lesion at the tail of pancreas/splenic hilum is obscured by susceptibility artifact from metallic fragment in the left abdomen. Spleen:  Obscured. Adrenals/Urinary Tract: No adrenal nodule or mass. Right kidney unremarkable. Left kidney largely obscured by susceptibility artifact. Stomach/Bowel: Stomach is unremarkable. No gastric wall thickening. No evidence of outlet obstruction. Duodenum is normally positioned as is the ligament of Treitz. No small bowel or colonic dilatation within the visualized abdomen. Vascular/Lymphatic: No abdominal aortic aneurysm. There is no gastrohepatic or hepatoduodenal ligament lymphadenopathy. No retroperitoneal or mesenteric lymphadenopathy. Other:  No intraperitoneal free fluid. Musculoskeletal: No focal suspicious marrow enhancement within the visualized bony anatomy. IMPRESSION: 1. Hepatic steatosis. 2. Cystic lesion at the tail of pancreas/splenic hilum seen on CT earlier today is obscured by susceptibility artifact from metallic fragment in the left abdomen. 3. No evidence for cholelithiasis.  No biliary dilation. Electronically Signed   By: EMisty StanleyM.D.   On: 07/27/2020 18:06   MR ABDOMEN MRCP W WO CONTAST  Result Date: 07/27/2020 CLINICAL DATA:  Fatty liver. EXAM: MRI ABDOMEN WITHOUT AND WITH CONTRAST (INCLUDING MRCP) TECHNIQUE: Multiplanar multisequence MR imaging of the abdomen was performed both  before and after the administration of intravenous contrast. Heavily T2-weighted images of the biliary and pancreatic ducts were obtained, and three-dimensional MRCP images were rendered by post processing. CONTRAST:  972mGADAVIST GADOBUTROL 1 MMOL/ML IV SOLN COMPARISON:  CT scan 07/27/2020 FINDINGS: Lower chest: Unremarkable. Hepatobiliary: Diffuse loss of signal intensity in the liver parenchyma on out of phase T1 imaging is consistent with fatty deposition. There is some sparing along the gallbladder fossa. There is no evidence for gallstones, gallbladder wall thickening, or pericholecystic fluid. No intrahepatic or extrahepatic biliary dilation. No choledocholithiasis. Pancreas: No focal mass lesion. No dilatation of the main duct. No peripancreatic edema. Cystic lesion at the tail of pancreas/splenic hilum is obscured by susceptibility artifact from metallic fragment in the left abdomen. Spleen:  Obscured. Adrenals/Urinary Tract: No adrenal nodule or mass. Right kidney unremarkable. Left kidney largely obscured by susceptibility artifact. Stomach/Bowel: Stomach is unremarkable. No gastric wall thickening. No evidence of outlet obstruction. Duodenum is  normally positioned as is the ligament of Treitz. No small bowel or colonic dilatation within the visualized abdomen. Vascular/Lymphatic: No abdominal aortic aneurysm. There is no gastrohepatic or hepatoduodenal ligament lymphadenopathy. No retroperitoneal or mesenteric lymphadenopathy. Other:  No intraperitoneal free fluid. Musculoskeletal: No focal suspicious marrow enhancement within the visualized bony anatomy. IMPRESSION: 1. Hepatic steatosis. 2. Cystic lesion at the tail of pancreas/splenic hilum seen on CT earlier today is obscured by susceptibility artifact from metallic fragment in the left abdomen. 3. No evidence for cholelithiasis.  No biliary dilation. Electronically Signed   By: Misty Stanley M.D.   On: 07/27/2020 18:06     ASSESSMENT  Mr. Hawker  is a 39 y.o. male  with a pmh significant for alcohol use disorder, previous necrotizing pancreatitis, GERD, MDD, anxiety, hypertension, pancreatic cyst.  The patient was admitted with abdominal pain and abnormal LFTs and anorexia.  The patient is hemodynamically stable.  Clinically he seems to be having some slight improvement.  Is not clear the underlying etiology of his symptomatology and findings of significant abnormal LFTs.  Certainly there is some contribution from his underlying alcohol use but this is not completely alcoholic hepatitis.  Still has a clinical picture that is very suggestive of biliary colic but he has no evidence of gallstone disease on his imaging.  I think based on the persistence of symptoms although he is having some improvement today, he will benefit from an upper endoscopy with endoscopic ultrasound.  I will have an opportunity to also rule out chronic pancreatitis as well as to evaluate the pancreatic cyst that is likely a issue from his prior necrotizing pancreatitis 3 years ago (in the setting of his significant alcohol use disorder at that time).  I do not plan to likely sample the cyst unless something were to look abnormal.  If the patient is feeling very well tomorrow we may not need to pursue this otherwise we will plan to try and have this done on Thursday as an EGD/EUS.  The risks of an EUS including intestinal perforation, bleeding, infection, aspiration, and medication effects were discussed as was the possibility it may not give a definitive diagnosis if a biopsy is performed.  When a biopsy of the pancreas is done as part of the EUS, there is an additional risk of pancreatitis at the rate of about 1-2%.  It was explained that procedure related pancreatitis is typically mild, although it can be severe and even life threatening, which is why we do not perform random pancreatic biopsies and only biopsy a lesion/area we feel is concerning enough to warrant the risk.  The  risks and benefits of endoscopic evaluation were discussed with the patient; these include but are not limited to the risk of perforation, infection, bleeding, missed lesions, lack of diagnosis, severe illness requiring hospitalization, as well as anesthesia and sedation related illnesses.  The patient is agreeable to proceed.   PLAN/RECOMMENDATIONS  Protonix 40 mg twice daily Carafate can be added as needed depending on how he does over the course the next 24 hours Tentative EGD/EUS on Thursday (pending time and availability with anesthesia) -N.p.o. at midnight -If patient is doing very well we can consider holding on this Trend LFTs and INR tomorrow    Please page/call with questions or concerns.   Justice Britain, MD [facility] Office # [phone]    LOS: 1 day  Irving Copas  07/29/2020, 2:41 PM

## 2020-07-30 ENCOUNTER — Encounter (HOSPITAL_COMMUNITY): Payer: Self-pay | Admitting: Internal Medicine

## 2020-07-30 ENCOUNTER — Encounter (HOSPITAL_COMMUNITY)
Admission: EM | Disposition: A | Discharge status: Home / Self Care | Payer: Self-pay | Source: Home / Self Care | Attending: Internal Medicine

## 2020-07-30 ENCOUNTER — Inpatient Hospital Stay (HOSPITAL_COMMUNITY): Payer: 59 | Admitting: Certified Registered"

## 2020-07-30 DIAGNOSIS — K209 Esophagitis, unspecified without bleeding: Secondary | ICD-10-CM

## 2020-07-30 DIAGNOSIS — K3189 Other diseases of stomach and duodenum: Secondary | ICD-10-CM

## 2020-07-30 HISTORY — PX: ESOPHAGOGASTRODUODENOSCOPY (EGD) WITH PROPOFOL: SHX5813

## 2020-07-30 HISTORY — PX: BIOPSY: SHX5522

## 2020-07-30 LAB — PROTIME-INR
INR: 1.1 (ref 0.8–1.2)
Prothrombin Time: 13.5 seconds (ref 11.4–15.2)

## 2020-07-30 LAB — COMPREHENSIVE METABOLIC PANEL
ALT: 141 U/L — ABNORMAL HIGH (ref 0–44)
AST: 90 U/L — ABNORMAL HIGH (ref 15–41)
Albumin: 2.7 g/dL — ABNORMAL LOW (ref 3.5–5.0)
Alkaline Phosphatase: 75 U/L (ref 38–126)
Anion gap: 10 (ref 5–15)
BUN: 10 mg/dL (ref 6–20)
CO2: 29 mmol/L (ref 22–32)
Calcium: 8.4 mg/dL — ABNORMAL LOW (ref 8.9–10.3)
Chloride: 96 mmol/L — ABNORMAL LOW (ref 98–111)
Creatinine, Ser: 1.27 mg/dL — ABNORMAL HIGH (ref 0.61–1.24)
GFR, Estimated: >60 mL/min (ref >60)
Glucose, Bld: 143 mg/dL — ABNORMAL HIGH (ref 70–99)
Potassium: 3.6 mmol/L (ref 3.5–5.1)
Sodium: 135 mmol/L (ref 135–145)
Total Bilirubin: 2 mg/dL — ABNORMAL HIGH (ref 0.3–1.2)
Total Protein: 5.7 g/dL — ABNORMAL LOW (ref 6.5–8.1)

## 2020-07-30 SURGERY — ESOPHAGOGASTRODUODENOSCOPY (EGD) WITH PROPOFOL
Anesthesia: Monitor Anesthesia Care

## 2020-07-30 MED ORDER — SUCRALFATE 1 GM/10ML PO SUSP
1.0000 g | Freq: Two times a day (BID) | ORAL | Status: DC
  Administered 2020-07-30 – 2020-07-31 (×3): 1 g via ORAL
  Filled 2020-07-30 (×3): qty 10

## 2020-07-30 MED ORDER — PANTOPRAZOLE SODIUM 40 MG PO TBEC
40.0000 mg | DELAYED_RELEASE_TABLET | Freq: Every day | ORAL | Status: DC
  Administered 2020-07-30 – 2020-07-31 (×2): 40 mg via ORAL
  Filled 2020-07-30 (×3): qty 1

## 2020-07-30 MED ORDER — DIPHENHYDRAMINE HCL 50 MG/ML IJ SOLN
25.0000 mg | Freq: Four times a day (QID) | INTRAMUSCULAR | Status: DC | PRN
  Administered 2020-07-31: 25 mg via INTRAVENOUS
  Filled 2020-07-30: qty 1

## 2020-07-30 MED ORDER — LIDOCAINE 2% (20 MG/ML) 5 ML SYRINGE
INTRAMUSCULAR | Status: DC | PRN
  Administered 2020-07-30: 40 mg via INTRAVENOUS

## 2020-07-30 MED ORDER — SODIUM CHLORIDE 0.9 % IV SOLN: INTRAVENOUS | Status: DC

## 2020-07-30 MED ORDER — MORPHINE SULFATE (PF) 2 MG/ML IV SOLN
2.0000 mg | Freq: Once | INTRAVENOUS | Status: AC
  Administered 2020-07-30: 2 mg via INTRAVENOUS
  Filled 2020-07-30: qty 1

## 2020-07-30 MED ORDER — PROPOFOL 500 MG/50ML IV EMUL
INTRAVENOUS | Status: DC | PRN
  Administered 2020-07-30: 160 ug/kg/min via INTRAVENOUS

## 2020-07-30 NOTE — Anesthesia Preprocedure Evaluation (Addendum)
Anesthesia Evaluation  Patient identified by MRN, date of birth, ID band Patient awake    Reviewed: Allergy & Precautions, NPO status , Patient's Chart, lab work & pertinent test results  History of Anesthesia Complications Negative for: history of anesthetic complications  Airway Mallampati: III  TM Distance: >3 FB Neck ROM: Full    Dental  (+) Dental Advisory Given, Teeth Intact   Pulmonary neg pulmonary ROS, neg shortness of breath, neg sleep apnea, neg COPD, neg recent URI, former smoker,  Covid-19 Nucleic Acid Test Results Lab Results      Component                Value               Date                      [place]              NEGATIVE            07/27/2020                [place]              Not Detected        07/29/2019                [place]              Not Detected        02/15/2019              breath sounds clear to auscultation       Cardiovascular hypertension, Pt. on medications (-) angina(-) Past MI and (-) CHF  Rhythm:Regular     Neuro/Psych PSYCHIATRIC DISORDERS Anxiety Depression negative neurological ROS     GI/Hepatic GERD  ,(+) Hepatitis -Lab Results      Component                Value               Date                      ALT                      141 (H)             07/30/2020                AST                      90 (H)              07/30/2020                ALKPHOS                  75                  07/30/2020                BILITOT                  2.0 (H)             07/30/2020              Endo/Other  negative endocrine ROS  Renal/GU ARFRenal diseaseLab Results      Component  Value               Date                      CREATININE               1.27 (H)            07/30/2020                Musculoskeletal negative musculoskeletal ROS (+)   Abdominal   Peds  Hematology negative hematology ROS (+) Lab Results      Component                Value                Date                      WBC                      9.9                 07/29/2020                HGB                      13.8                07/29/2020                HCT                      39.3                07/29/2020                MCV                      90.6                07/29/2020                PLT                      116 (L)             07/29/2020              Anesthesia Other Findings   Reproductive/Obstetrics                             Anesthesia Physical Anesthesia Plan  ASA: III  Anesthesia Plan: MAC   Post-op Pain Management:    Induction: Intravenous  PONV Risk Score and Plan: 1 and Treatment may vary due to age or medical condition and Propofol infusion  Airway Management Planned: Nasal Cannula  Additional Equipment: None  Intra-op Plan:   Post-operative Plan:   Informed Consent: I have reviewed the patients History and Physical, chart, labs and discussed the procedure including the risks, benefits and alternatives for the proposed anesthesia with the patient or authorized representative who has indicated his/her understanding and acceptance.     Dental advisory given  Plan Discussed with: CRNA and Anesthesiologist  Anesthesia Plan Comments:         Anesthesia Quick Evaluation

## 2020-07-30 NOTE — Interval H&P Note (Signed)
History and Physical Interval Note:  07/30/2020 9:28 AM  [name]  has presented today for surgery, with the diagnosis of Abdominal pain, abnormal LFTs, pancreatic cyst, rule out chronic pancreatitis.  The various methods of treatment have been discussed with the patient and family. After consideration of risks, benefits and other options for treatment, the patient has consented to  Procedure(s): UPPER ENDOSCOPIC ULTRASOUND (EUS) LINEAR (N/A) ESOPHAGOGASTRODUODENOSCOPY (EGD) WITH PROPOFOL (N/A) as a surgical intervention.  The patient's history has been reviewed, patient examined, no change in status, stable for surgery.  I have reviewed the patient's chart and labs.  Questions were answered to the patient's satisfaction.     [employer]

## 2020-07-30 NOTE — Op Note (Signed)
[facility] Patient Name: Glenn [name] Procedure Date : 07/30/2020 MRN: [id number] Attending MD: Justice Britain , MD Date of Birth: October 09, 1980 CSN: [id number] Age: 39 Admit Type: Inpatient Procedure:                Upper GI endoscopy - intention had been for EUS but                            patient had large foodstuffs Indications:              Abdominal pain in the right upper quadrant,                            Generalized abdominal pain, Exclusion of peptic                            ulcer, Pain atypical for biliary colic but to try                            and exclude microcholelithiasis (not seen on U/S) Providers:                Justice Britain, MD, Kary Kos, RN, Cletis [place], Technician, Lesia Sago, Technician,                            Barrett Henle, CRNA Referring MD:             [facility] Medicines:                Monitored Anesthesia Care Complications:            No immediate complications. Estimated Blood Loss:     Estimated blood loss was minimal. Procedure:                Pre-Anesthesia Assessment:                           - Prior to the procedure, a History and Physical                            was performed, and patient medications and                            allergies were reviewed. The patient's tolerance of                            previous anesthesia was also reviewed. The risks                            and benefits of the procedure and the sedation                            options and risks were discussed with the patient.  All questions were answered, and informed consent                            was obtained. Prior Anticoagulants: The patient has                            taken no previous anticoagulant or antiplatelet                            agents. ASA Grade Assessment: II - A patient with                            mild systemic disease. After  reviewing the risks                            and benefits, the patient was deemed in                            satisfactory condition to undergo the procedure.                           After obtaining informed consent, the endoscope was                            passed under direct vision. Throughout the                            procedure, the patient's blood pressure, pulse, and                            oxygen saturations were monitored continuously. The                            [id number]-H190 ([id number]) Olympus gastroscope was                            introduced through the mouth, and advanced to the                            second part of duodenum. After obtaining informed                            consent, the endoscope was passed under direct                            vision. Throughout the procedure, the patient's                            blood pressure, pulse, and oxygen saturations were                            monitored continuously. The [id number]-[id number] ([id number])  Olympus Duodenoscope was introduced through the                            mouth, and advanced to the second part of duodenum.                            The upper GI endoscopy was accomplished without                            difficulty. The patient tolerated the procedure. Scope In: Scope Out: Findings:      No gross lesions were noted in the proximal esophagus and in the mid       esophagus.      LA Grade B (one or more mucosal breaks greater than 5 mm, not extending       between the tops of two mucosal folds) esophagitis with no bleeding was       found in the distal esophagus in [place].      A large amount of food (residue) was found in the gastric body. This       precludes ability to perform EUS.      Patchy mildly erythematous mucosa was found in the gastric body.       Biopsies were taken randomly from stomach with a cold forceps for       histology and Helicobacter  pylori testing.      No gross lesions were noted in the duodenal bulb, in the first portion       of the duodenum and in the second portion of the duodenum.      The major papilla was normal and hidden under a hood. Impression:               - No gross lesions in esophagus proximally. LA                            Grade B esophagitis with no bleeding distally and                            at [employer].                           - A large amount of food (residue) in the stomach -                            unfortunately precludes ability to safely and                            accurately perform EUS.                           - Erythematous mucosa in the gastric body. Biopsied                            for HP.                           - No gross lesions in the duodenal bulb, in the  first portion of the duodenum and in the second                            portion of the duodenum.                           - Normal major papilla (under hood). Recommendation:           - The patient will be observed post-procedure,                            until all discharge criteria are met.                           - Return patient to hospital ward for ongoing care.                           - Advance diet as tolerated.                           - Continue Protonix 40 mg daily.                           - Trend LFTs while inpatient.                           - Start Carafate 1g BID.                           - If patient's pain progresses or worsens then                            obtain HIDA.                           - Complete alcohol cessation recommended.                           - If issues of N/V persist as outpatient in near                            future, consideration of SF-GES could be had.                           - MRI/MRCP should be considered to ensure stability                            of the pancreatic cyst in 30-month.                            - Should patient want to follow up in clinic that                            can be arranged. Recommendation for potential  repeat EGD in 36-month to evaluate and ensure                            healing of esophagus would be recommended.                            Consideration of repeat attempt at EGD-EUS can also                            be considered.                           - The findings and recommendations were discussed                            with the patient.                           - The findings and recommendations were discussed                            with the referring physician. Procedure Code(s):        --- Professional ---                           [id number] Esophagogastroduodenoscopy, flexible,                            transoral; with biopsy, single or multiple Diagnosis Code(s):        --- Professional ---                           K20.90, Esophagitis, unspecified without bleeding                           K31.89, Other diseases of stomach and duodenum                           R10.11, Right upper quadrant pain                           R10.84, Generalized abdominal pain CPT copyright 2019 American Medical Association. All rights reserved. The codes documented in this report are preliminary and upon coder review may  be revised to meet current compliance requirements. GJustice Britain MD 07/30/2020 10:08:33 AM Number of Addenda: 0

## 2020-07-30 NOTE — Anesthesia Postprocedure Evaluation (Signed)
Anesthesia Post Note  Patient: Glenn [name]  Procedure(s) Performed: ESOPHAGOGASTRODUODENOSCOPY (EGD) WITH PROPOFOL (N/A ) BIOPSY     Patient location during evaluation: Endoscopy Anesthesia Type: MAC Level of consciousness: awake and alert Pain management: pain level controlled Vital Signs Assessment: post-procedure vital signs reviewed and stable Respiratory status: spontaneous breathing, nonlabored ventilation, respiratory function stable and patient connected to nasal cannula oxygen Cardiovascular status: stable and blood pressure returned to baseline Postop Assessment: no apparent nausea or vomiting Anesthetic complications: no   No complications documented.  Last Vitals:  Vitals:   07/30/20 1025 07/30/20 1147  BP: (!) 142/109 124/89  Pulse: 87 87  Resp: 15 18  Temp:  36.8 C  SpO2: 97% 94%    Last Pain:  Vitals:   07/30/20 1147  TempSrc: Oral  PainSc:                  Rhonda Vangieson

## 2020-07-30 NOTE — Transfer of Care (Signed)
Immediate Anesthesia Transfer of Care Note  Patient: Glenn [name]  Procedure(s) Performed: ESOPHAGOGASTRODUODENOSCOPY (EGD) WITH PROPOFOL (N/A ) BIOPSY  Patient Location: Endoscopy Unit  Anesthesia Type:MAC  Level of Consciousness: sedated and responds to stimulation  Airway & Oxygen Therapy: Patient Spontanous Breathing and Patient connected to nasal cannula oxygen  Post-op Assessment: Report given to RN, Post -op Vital signs reviewed and stable and Patient moving all extremities  Post vital signs: Reviewed and stable  Last Vitals:  Vitals Value Taken Time  BP    Temp    Pulse    Resp    SpO2      Last Pain:  Vitals:   07/30/20 0802  TempSrc: Temporal  PainSc: 5       Patients Stated Pain Goal: 0 (36/92/23 0097)  Complications: No complications documented.

## 2020-07-30 NOTE — Progress Notes (Signed)
PROGRESS NOTE    Glenn [name]  CVE:[id number] DOB: [birth year] DOA: 07/27/2020 PCP: Marin Olp, MD    Brief Narrative:  Glenn [name] is an 39 y.o. male with medical history significant forhypertension, history of acute pancreatitis, panic disorder, depression, anxiety, history of alcohol abuse presenting to the emergency department for chief concerns of acute onset abdominal pain and abnormal LFTs and anorexia.  12/1-trying a sandwich today. 12/2-EGD today. Unable to do EUS due to  Food residue in stomach Consultants:   GI  Procedures:   Antimicrobials:      Subjective: Pt lying in bed, reports still with pain , but milder. Tolerating clears.   Objective: Vitals:   07/29/20 2338 07/30/20 0412 07/30/20 0600 07/30/20 0802  BP: 130/85 129/86 129/86 (!) 142/97  Pulse: 85 95 96 91  Resp: 18 18  12   Temp: 98.7 F (37.1 C) 98.7 F (37.1 C)  97.9 F (36.6 C)  TempSrc: Oral Oral  Temporal  SpO2: 98% 98%  99%  Weight:      Height:       No intake or output data in the 24 hours ending 07/30/20 0921 Filed Weights   07/27/20 0858  Weight: 93.4 kg    Examination: Calm, comfortable CTA no wheeze rales rhonchi's Regular S1-S2 no murmurs Soft, minimal tenderness nondistended positive bowel sounds No edema Alert oriented x3, grossly intact       Data Reviewed: I have personally reviewed following labs and imaging studies  CBC: Recent Labs  Lab 07/27/20 0915 07/27/20 1226 07/28/20 0717 07/29/20 0254  WBC 8.0  --  7.9 9.9  NEUTROABS 5.4  --   --   --   HGB 18.4* 17.7* 13.7 13.8  HCT 53.2* 52.0 40.1 39.3  MCV 90.6  --  91.6 90.6  PLT 268  --  157 025*   Basic Metabolic Panel: Recent Labs  Lab 07/27/20 0915 07/27/20 1226 07/27/20 1429 07/28/20 0717 07/29/20 0254 07/30/20 0501  NA 135 136  --  127* 134* 135  K 4.7 5.7*  --  3.9 4.0 3.6  CL 102  --   --  97* 98 96*  CO2 17*  --   --  23 25 29   GLUCOSE 165*  --   --  176* 185* 143*  BUN 29*  --    --  12 7 10   CREATININE 1.59*  --   --  1.22 1.27* 1.27*  CALCIUM 8.9  --   --  8.4* 8.7* 8.4*  MG  --   --  1.9  --   --   --   PHOS  --   --  4.2  --   --   --    GFR: Estimated Creatinine Clearance: 86.6 mL/min (A) (by C-G formula based on SCr of 1.27 mg/dL (H)). Liver Function Tests: Recent Labs  Lab 07/27/20 0915 07/28/20 0717 07/29/20 0254 07/30/20 0501  AST 940* 513* 232* 90*  ALT 492* 396* 249* 141*  ALKPHOS 134* 91 81 75  BILITOT 2.2* 3.1* 2.9* 2.0*  PROT 7.3 5.9* 6.0* 5.7*  ALBUMIN 4.1 3.3* 3.0* 2.7*   Recent Labs  Lab 07/27/20 0915  LIPASE 56*   No results for input(s): AMMONIA in the last 168 hours. Coagulation Profile: Recent Labs  Lab 07/27/20 1428 07/28/20 0717 07/30/20 0501  INR 1.8* 1.3* 1.1   Cardiac Enzymes: Recent Labs  Lab 07/28/20 0717  CKTOTAL 62   BNP (last 3 results) No results for  input(s): PROBNP in the last 8760 hours. HbA1C: No results for input(s): HGBA1C in the last 72 hours. CBG: No results for input(s): GLUCAP in the last 168 hours. Lipid Profile: No results for input(s): CHOL, HDL, LDLCALC, TRIG, CHOLHDL, LDLDIRECT in the last 72 hours. Thyroid Function Tests: Recent Labs    07/27/20 1429  TSH 1.802   Anemia Panel: Recent Labs    07/28/20 0717  FERRITIN 6,880*  TIBC 193*  IRON 187*   Sepsis Labs: No results for input(s): PROCALCITON, LATICACIDVEN in the last 168 hours.  Recent Results (from the past 240 hour(s))  Resp Panel by RT-PCR (Flu A&B, Covid) Nasopharyngeal Swab     Status: None   Collection Time: 07/27/20 11:29 AM   Specimen: Nasopharyngeal Swab; Nasopharyngeal(NP) swabs in vial transport medium  Result Value Ref Range Status   SARS Coronavirus 2 by RT PCR NEGATIVE NEGATIVE Final    Comment: (NOTE) SARS-CoV-2 target nucleic acids are NOT DETECTED.  The SARS-CoV-2 RNA is generally detectable in upper respiratory specimens during the acute phase of infection. The lowest concentration of SARS-CoV-2  viral copies this assay can detect is 138 copies/mL. A negative result does not preclude SARS-Cov-2 infection and should not be used as the sole basis for treatment or other patient management decisions. A negative result may occur with  improper specimen collection/handling, submission of specimen other than nasopharyngeal swab, presence of viral mutation(s) within the areas targeted by this assay, and inadequate number of viral copies(<138 copies/mL). A negative result must be combined with clinical observations, patient history, and epidemiological information. The expected result is Negative.  Fact Sheet for Patients:  EntrepreneurPulse.com.au  Fact Sheet for Healthcare Providers:  IncredibleEmployment.be  This test is no t yet approved or cleared by the [place] FDA and  has been authorized for detection and/or diagnosis of SARS-CoV-2 by FDA under an Emergency Use Authorization (EUA). This EUA will remain  in effect (meaning this test can be used) for the duration of the COVID-19 declaration under Section 564(b)(1) of the Act, 21 U.S.C.section 360bbb-3(b)(1), unless the authorization is terminated  or revoked sooner.       Influenza A by PCR NEGATIVE NEGATIVE Final   Influenza B by PCR NEGATIVE NEGATIVE Final    Comment: (NOTE) The Xpert Xpress SARS-CoV-2/FLU/RSV plus assay is intended as an aid in the diagnosis of influenza from Nasopharyngeal swab specimens and should not be used as a sole basis for treatment. Nasal washings and aspirates are unacceptable for Xpert Xpress SARS-CoV-2/FLU/RSV testing.  Fact Sheet for Patients: EntrepreneurPulse.com.au  Fact Sheet for Healthcare Providers: IncredibleEmployment.be  This test is not yet approved or cleared by the [place] FDA and has been authorized for detection and/or diagnosis of SARS-CoV-2 by FDA under an Emergency Use Authorization  (EUA). This EUA will remain in effect (meaning this test can be used) for the duration of the COVID-19 declaration under Section 564(b)(1) of the Act, 21 U.S.C. section 360bbb-3(b)(1), unless the authorization is terminated or revoked.  Performed at [facility], [place] [address] [place], [place] [address]          Radiology Studies: No results found.      Scheduled Meds: . [MAR Hold] amLODipine  5 mg Oral Daily  . [MAR Hold] feeding supplement  237 mL Oral BID BM  . [MAR Hold] fluvoxaMINE  100 mg Oral QHS  . [MAR Hold] folic acid  1 mg Oral Daily  . [MAR Hold] methylphenidate  10 mg Oral TID  WC  . [MAR Hold] multivitamin with minerals  1 tablet Oral Daily  . [MAR Hold] pantoprazole  40 mg Oral BID AC  . [MAR Hold] thiamine  100 mg Oral Daily   Or  . [MAR Hold] thiamine  100 mg Intravenous Daily   Continuous Infusions: . sodium chloride    . lactated ringers 75 mL/hr at 07/30/20 0339  . 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 Gi following. Plan for upper EGD with endoscopic US-r/o chronic pancreatitis and evaluate pancreatic cyst 12/2-status post EGD with LA grade B esophagitis with no bleed distally.  Due to large amount of food residue unable to perform EUS.  Erythematous mucosa in the gastric body biopsy for HP.  Started on clears and will advance diet as tolerated LFTs improving If patient's pain progresses or worsen will obtain HIDA per GIs recommendation To start Carafate 1 g twice daily Continue Protonix 40 daily Complete alcohol  cessation recommended We will wean off pain meds Will need repeat EGD in 3 months   2.  Primary hypertension-continue amlodipine  3.  Major depressive disorder-currently in remission.  Continue fluvoxamine   4.Insomnia-resume trazodone  5.panic ds/anxiety- hold valium Ativan prn  6.hyponatremia- na on admission 127 Improved with IV fluids and now stable   7.hx/o alcohol abuse On CIWA protocal Continue folate and thiamine Was counseled on alcohol cessation   DVT prophylaxis: scd Code Status: Full Family Communication: None at bedside  Status is: Inpatient  Remains inpatient appropriate because:Ongoing diagnostic testing needed not appropriate for outpatient work up   Dispo: The patient is from: Home              Anticipated d/c is to: Home              Anticipated d/c date is: 1 day              Patient currently is not medically stable to d/c.  Will wean off pain meds if no pain and DC in a.m. if he tolerates p.o.            LOS: 2 days   Time spent: 35 minutes with more than 50% on [place], MD [facility] Pager [phone]  If 7PM-7AM, please contact night-coverage [email or web address] Password [facility] 07/30/2020, 9:21 AM

## 2020-07-31 ENCOUNTER — Other Ambulatory Visit: Payer: Self-pay | Admitting: Physician Assistant

## 2020-07-31 DIAGNOSIS — B179 Acute viral hepatitis, unspecified: Secondary | ICD-10-CM | POA: Diagnosis not present

## 2020-07-31 LAB — SURGICAL PATHOLOGY

## 2020-07-31 MED ORDER — MORPHINE SULFATE (PF) 2 MG/ML IV SOLN
2.0000 mg | Freq: Once | INTRAVENOUS | Status: AC
  Administered 2020-07-31: 2 mg via INTRAVENOUS

## 2020-07-31 MED ORDER — THIAMINE HCL 100 MG PO TABS: 100.0000 mg | ORAL_TABLET | Freq: Every day | ORAL | 0 refills | Status: AC

## 2020-07-31 MED ORDER — PANTOPRAZOLE SODIUM 40 MG PO TBEC: 40.0000 mg | DELAYED_RELEASE_TABLET | Freq: Every day | ORAL | 1 refills | Status: DC

## 2020-07-31 MED ORDER — AMLODIPINE BESYLATE 5 MG PO TABS: 5.0000 mg | ORAL_TABLET | Freq: Every day | ORAL | 0 refills | Status: DC

## 2020-07-31 MED ORDER — MORPHINE SULFATE (PF) 2 MG/ML IV SOLN
INTRAVENOUS | Status: AC
  Filled 2020-07-31: qty 1

## 2020-07-31 MED ORDER — FOLIC ACID 1 MG PO TABS: 1.0000 mg | ORAL_TABLET | Freq: Every day | ORAL | 0 refills | Status: AC

## 2020-07-31 MED ORDER — SUCRALFATE 1 GM/10ML PO SUSP: 1.0000 g | Freq: Two times a day (BID) | ORAL | 0 refills | Status: DC

## 2020-07-31 NOTE — Discharge Summary (Signed)
Glenn [name] WEX:[id number] DOB: 08/05/1981 DOA: 07/27/2020  PCP: Marin Olp, MD  Admit date: 07/27/2020 Discharge date: 07/31/2020  Admitted From: home Disposition:  home  Recommendations for Outpatient Follow-up:  1. Follow up with PCP in 1 week 2. Please obtain BMP/CBC in one week 3. Follow up with GI in 2-3 weeks     Discharge Condition:Stable CODE STATUS:full  Diet recommendation: Heart Healthy Brief/Interim Summary: Glenn [name] is a 39 y.o. male with medical history significant for hypertension, history of acute pancreatitis, panic disorder, depression, anxiety, history of alcohol abuse presenting to the emergency department for chief concerns of acute onset abdominal pain.  Patient also found with elevated LFTs.  GI was consulted.  1.Abn LFT/abd pain- imaging without gallstone dz.  Plan for upper EGD with endoscopic US-r/o chronic pancreatitis and evaluate pancreatic cyst CT abdomen found with pseudocyst and  Radiology Recommend follow-up CT in 6-12 months to assess for stability. On 12/2-status post EGD with LA grade B esophagitis with no bleed distally.  Due to large amount of food residue unable to perform EUS.  Erythematous mucosa in the gastric body biopsy for HP.  Carafate was added. Multiple hepatitis serologies all negative. ANA, smooth muscle antibody, immunoglobulin QN/A/B/G/M levels ordered pending. Will need pcp or GI to f/u with results LFTs improving Was counseled to stop all alcohol  Carafate 1 g twice daily x4 weeks Continue Protonix 40 daily Will need repeat EGD in 3 months, to f/u with GI .   2.  Primary hypertension-continue amlodipine. Held losartan as bp stable.  3.  Major depressive disorder-currently in remission.  Continue fluvoxamine   4.Insomnia-on home trazodone  5.panic ds/anxiety- continue home  6.hyponatremia- na on admission 127 Improved with IV fluids and now stable   7.hx/o alcohol abuse- no withdrawl Continue  folate and thiamine Was counseled on alcohol cessation   Discharge Diagnoses:  Principal Problem:   Acute hepatitis Active Problems:   Panic disorder   Tobacco abuse   Hepatic steatosis   Alcohol use disorder, moderate, in controlled environment (HCC)   GAD (generalized anxiety disorder)   Mood disorder with depressive features due to general medical condition   Chronic fatigue   Abdominal pain    Discharge Instructions  Discharge Instructions    Call MD for:  severe uncontrolled pain   Complete by: As directed    Diet - low sodium heart healthy   Complete by: As directed    Discharge instructions   Complete by: As directed    No alcohol consumption F/u with pcp in one week F/u with GI in 3 weeks   Increase activity slowly   Complete by: As directed      Allergies as of 07/31/2020      Reactions   Morphine And Related Itching      Medication List    STOP taking these medications   losartan 100 MG tablet Commonly known as: COZAAR   methylphenidate 10 MG tablet Commonly known as: RITALIN     TAKE these medications   amLODipine 5 MG tablet Commonly known as: NORVASC Take 1 tablet (5 mg total) by mouth daily.   diazepam 10 MG tablet Commonly known as: VALIUM Take 1 tablet (10 mg total) by mouth 2 (two) times daily as needed for anxiety (panic).   fluvoxaMINE 100 MG tablet Commonly known as: LUVOX TAKE 1 TABLET(100 MG) BY MOUTH AT BEDTIME What changed:   how much to take  how to take this  when  to take this  additional instructions   folic acid 1 MG tablet Commonly known as: FOLVITE Take 1 tablet (1 mg total) by mouth daily.   OVER THE COUNTER MEDICATION Take 6 capsules by mouth See admin instructions. Pt takes fruit and vegetable supplement. 3 fruit capsules and 3 vegetable capsules once daily   OVER THE COUNTER MEDICATION Take 1 Scoop by mouth daily. Super beets supplement powder.   pantoprazole 40 MG tablet Commonly known as:  PROTONIX Take 1 tablet (40 mg total) by mouth daily.   sucralfate 1 GM/10ML suspension Commonly known as: CARAFATE Take 10 mLs (1 g total) by mouth 2 (two) times daily.   thiamine 100 MG tablet Take 1 tablet (100 mg total) by mouth daily.   traZODone 100 MG tablet Commonly known as: DESYREL TAKE 1 TABLET(100 MG) BY MOUTH AT BEDTIME AS NEEDED FOR SLEEP What changed:   how much to take  how to take this  when to take this  reasons to take this  additional instructions       Follow-up Information    Levin Erp, PA Follow up on 08/18/2020.   Specialty: Gastroenterology Why: 930 AM follow up with Gertie Fey PA.   Contact information: [address] [place] [address] [phone]        [facility] Follow up on 08/13/2020.   Why: go to lab in basement on the  thurs, friday or monday before your appointment with GI.   Contact information: [address]  [place] [address]       Marin Olp, MD Follow up in 1 week(s).   Specialty: Family Medicine Contact information: [place] [address] [phone]              Allergies  Allergen Reactions   Morphine And Related Itching    Consultations:  GI   Procedures/Studies: CT Abdomen Pelvis W Contrast  Result Date: 07/27/2020 CLINICAL DATA:  Epigastric pain with nausea beginning around midnight last night. EXAM: CT ABDOMEN AND PELVIS WITH CONTRAST TECHNIQUE: Multidetector CT imaging of the abdomen and pelvis was performed using the standard protocol following bolus administration of intravenous contrast. CONTRAST:  173mL OMNIPAQUE IOHEXOL 300 MG/ML  SOLN COMPARISON:  02/16/2018 FINDINGS: Lower chest: Clear lung bases.  Heart normal in size. Hepatobiliary: Liver normal in size. Diffuse decreased attenuation of the liver consistent with fatty infiltration. Fatty sparing noted adjacent to the gallbladder and anterior superior  liver. No liver mass. Normal gallbladder. No bile duct dilation Pancreas: Cyst lies along the pancreatic tail adjacent to the spleen measuring 2.2 x 1.7 x 2.4 cm. No other pancreatic masses. Duct dilation or active inflammation. Spleen: Normal in size without focal abnormality. Adrenals/Urinary Tract: No adrenal masses. Kidneys normal in size, orientation and position with symmetric enhancement. Subtle low-attenuation area in the upper pole suggests a cysts common 9 mm in size. No other masses, no stones and no hydronephrosis. Normal ureters. Normal bladder. Stomach/Bowel: Stomach is within normal limits. Appendix appears normal. No evidence of bowel wall thickening, distention, or inflammatory changes. Vascular/Lymphatic: No significant vascular findings are present. No enlarged abdominal or pelvic lymph nodes. Reproductive: Unremarkable. Other: No abdominal wall hernia or abnormality. No abdominopelvic ascites. Musculoskeletal: No fracture or acute finding. No osteoblastic or osteolytic lesions. IMPRESSION: 1. No acute findings.  No evidence of active pancreatitis. 2. 2.4 cm cyst lies between the pancreatic tail and spleen consistent with a pseudocyst given the patient's history of  pancreatitis, new since the prior CT. Recommend follow-up CT in 6-12 months to assess for stability. 3. Extensive hepatic steatosis. Electronically Signed   By: Lajean Manes M.D.   On: 07/27/2020 12:09   MR 3D Recon At Scanner  Result Date: 07/27/2020 CLINICAL DATA:  Fatty liver. EXAM: MRI ABDOMEN WITHOUT AND WITH CONTRAST (INCLUDING MRCP) TECHNIQUE: Multiplanar multisequence MR imaging of the abdomen was performed both before and after the administration of intravenous contrast. Heavily T2-weighted images of the biliary and pancreatic ducts were obtained, and three-dimensional MRCP images were rendered by post processing. CONTRAST:  45mL GADAVIST GADOBUTROL 1 MMOL/ML IV SOLN COMPARISON:  CT scan 07/27/2020 FINDINGS: Lower chest:  Unremarkable. Hepatobiliary: Diffuse loss of signal intensity in the liver parenchyma on out of phase T1 imaging is consistent with fatty deposition. There is some sparing along the gallbladder fossa. There is no evidence for gallstones, gallbladder wall thickening, or pericholecystic fluid. No intrahepatic or extrahepatic biliary dilation. No choledocholithiasis. Pancreas: No focal mass lesion. No dilatation of the main duct. No peripancreatic edema. Cystic lesion at the tail of pancreas/splenic hilum is obscured by susceptibility artifact from metallic fragment in the left abdomen. Spleen:  Obscured. Adrenals/Urinary Tract: No adrenal nodule or mass. Right kidney unremarkable. Left kidney largely obscured by susceptibility artifact. Stomach/Bowel: Stomach is unremarkable. No gastric wall thickening. No evidence of outlet obstruction. Duodenum is normally positioned as is the ligament of Treitz. No small bowel or colonic dilatation within the visualized abdomen. Vascular/Lymphatic: No abdominal aortic aneurysm. There is no gastrohepatic or hepatoduodenal ligament lymphadenopathy. No retroperitoneal or mesenteric lymphadenopathy. Other:  No intraperitoneal free fluid. Musculoskeletal: No focal suspicious marrow enhancement within the visualized bony anatomy. IMPRESSION: 1. Hepatic steatosis. 2. Cystic lesion at the tail of pancreas/splenic hilum seen on CT earlier today is obscured by susceptibility artifact from metallic fragment in the left abdomen. 3. No evidence for cholelithiasis.  No biliary dilation. Electronically Signed   By: Misty Stanley M.D.   On: 07/27/2020 18:06   DG Chest Portable 1 View  Result Date: 07/27/2020 CLINICAL DATA:  Midline pain from the chest to the abdomen since last night. EXAM: PORTABLE CHEST 1 VIEW COMPARISON:  02/11/2018. FINDINGS: The heart size and mediastinal contours are within normal limits. Both lungs are clear. No pleural effusion or pneumothorax. The visualized skeletal  structures are unremarkable. IMPRESSION: No active disease. Electronically Signed   By: Lajean Manes M.D.   On: 07/27/2020 09:59   MR ABDOMEN MRCP W WO CONTAST  Result Date: 07/27/2020 CLINICAL DATA:  Fatty liver. EXAM: MRI ABDOMEN WITHOUT AND WITH CONTRAST (INCLUDING MRCP) TECHNIQUE: Multiplanar multisequence MR imaging of the abdomen was performed both before and after the administration of intravenous contrast. Heavily T2-weighted images of the biliary and pancreatic ducts were obtained, and three-dimensional MRCP images were rendered by post processing. CONTRAST:  79mL GADAVIST GADOBUTROL 1 MMOL/ML IV SOLN COMPARISON:  CT scan 07/27/2020 FINDINGS: Lower chest: Unremarkable. Hepatobiliary: Diffuse loss of signal intensity in the liver parenchyma on out of phase T1 imaging is consistent with fatty deposition. There is some sparing along the gallbladder fossa. There is no evidence for gallstones, gallbladder wall thickening, or pericholecystic fluid. No intrahepatic or extrahepatic biliary dilation. No choledocholithiasis. Pancreas: No focal mass lesion. No dilatation of the main duct. No peripancreatic edema. Cystic lesion at the tail of pancreas/splenic hilum is obscured by susceptibility artifact from metallic fragment in the left abdomen. Spleen:  Obscured. Adrenals/Urinary Tract: No adrenal nodule or mass. Right kidney unremarkable. Left kidney  largely obscured by susceptibility artifact. Stomach/Bowel: Stomach is unremarkable. No gastric wall thickening. No evidence of outlet obstruction. Duodenum is normally positioned as is the ligament of Treitz. No small bowel or colonic dilatation within the visualized abdomen. Vascular/Lymphatic: No abdominal aortic aneurysm. There is no gastrohepatic or hepatoduodenal ligament lymphadenopathy. No retroperitoneal or mesenteric lymphadenopathy. Other:  No intraperitoneal free fluid. Musculoskeletal: No focal suspicious marrow enhancement within the visualized bony  anatomy. IMPRESSION: 1. Hepatic steatosis. 2. Cystic lesion at the tail of pancreas/splenic hilum seen on CT earlier today is obscured by susceptibility artifact from metallic fragment in the left abdomen. 3. No evidence for cholelithiasis.  No biliary dilation. Electronically Signed   By: Misty Stanley M.D.   On: 07/27/2020 18:06   US Abdomen Limited RUQ (LIVER/GB)  Result Date: 07/27/2020 CLINICAL DATA:  Elevated liver function tests. EXAM: ULTRASOUND ABDOMEN LIMITED RIGHT UPPER QUADRANT COMPARISON:  February 16, 2018. FINDINGS: Gallbladder: No gallstones or wall thickening visualized. No sonographic Murphy sign noted by sonographer. Common bile duct: Diameter: 4 mm which is within normal limits Liver: No focal lesion identified. Increased echogenicity of hepatic parenchyma is noted suggesting hepatic steatosis. Portal vein is patent on color Doppler imaging with normal direction of blood flow towards the liver. Other: None. IMPRESSION: Hepatic steatosis. No other definite abnormality seen in the right upper quadrant of the abdomen. Electronically Signed   By: Marijo Conception M.D.   On: 07/27/2020 11:41       Subjective: Tolerated food.  Has no new complaints pain controlled  Discharge Exam: Vitals:   07/31/20 0031 07/31/20 0530  BP: 127/85 125/80  Pulse: 96 80  Resp: 18 18  Temp: 98.6 F (37 C) 98.2 F (36.8 C)  SpO2: 98% 99%   Vitals:   07/30/20 1147 07/30/20 1712 07/31/20 0031 07/31/20 0530  BP: 124/89 140/87 127/85 125/80  Pulse: 87 96 96 80  Resp: 18 17 18 18   Temp: 98.2 F (36.8 C) 98.6 F (37 C) 98.6 F (37 C) 98.2 F (36.8 C)  TempSrc: Oral Oral Oral Oral  SpO2: 94% 97% 98% 99%  Weight:      Height:        General: Pt is alert, awake, not in acute distress Cardiovascular: RRR, S1/S2 +, no rubs, no gallops Respiratory: CTA bilaterally, no wheezing, no rhonchi Abdominal: Soft, NT, ND, bowel sounds + Extremities: no edema, no cyanosis    The results of significant  diagnostics from this hospitalization (including imaging, microbiology, ancillary and laboratory) are listed below for reference.     Microbiology: Recent Results (from the past 240 hour(s))  Resp Panel by RT-PCR (Flu A&B, Covid) Nasopharyngeal Swab     Status: None   Collection Time: 07/27/20 11:29 AM   Specimen: Nasopharyngeal Swab; Nasopharyngeal(NP) swabs in vial transport medium  Result Value Ref Range Status   SARS Coronavirus 2 by RT PCR NEGATIVE NEGATIVE Final    Comment: (NOTE) SARS-CoV-2 target nucleic acids are NOT DETECTED.  The SARS-CoV-2 RNA is generally detectable in upper respiratory specimens during the acute phase of infection. The lowest concentration of SARS-CoV-2 viral copies this assay can detect is 138 copies/mL. A negative result does not preclude SARS-Cov-2 infection and should not be used as the sole basis for treatment or other patient management decisions. A negative result may occur with  improper specimen collection/handling, submission of specimen other than nasopharyngeal swab, presence of viral mutation(s) within the areas targeted by this assay, and inadequate number of viral copies(<138 copies/mL).  A negative result must be combined with clinical observations, patient history, and epidemiological information. The expected result is Negative.  Fact Sheet for Patients:  EntrepreneurPulse.com.au  Fact Sheet for Healthcare Providers:  IncredibleEmployment.be  This test is no t yet approved or cleared by the [place] FDA and  has been authorized for detection and/or diagnosis of SARS-CoV-2 by FDA under an Emergency Use Authorization (EUA). This EUA will remain  in effect (meaning this test can be used) for the duration of the COVID-19 declaration under Section 564(b)(1) of the Act, 21 U.S.C.section 360bbb-3(b)(1), unless the authorization is terminated  or revoked sooner.       Influenza A by PCR NEGATIVE  NEGATIVE Final   Influenza B by PCR NEGATIVE NEGATIVE Final    Comment: (NOTE) The Xpert Xpress SARS-CoV-2/FLU/RSV plus assay is intended as an aid in the diagnosis of influenza from Nasopharyngeal swab specimens and should not be used as a sole basis for treatment. Nasal washings and aspirates are unacceptable for Xpert Xpress SARS-CoV-2/FLU/RSV testing.  Fact Sheet for Patients: EntrepreneurPulse.com.au  Fact Sheet for Healthcare Providers: IncredibleEmployment.be  This test is not yet approved or cleared by the [place] FDA and has been authorized for detection and/or diagnosis of SARS-CoV-2 by FDA under an Emergency Use Authorization (EUA). This EUA will remain in effect (meaning this test can be used) for the duration of the COVID-19 declaration under Section 564(b)(1) of the Act, 21 U.S.C. section 360bbb-3(b)(1), unless the authorization is terminated or revoked.  Performed at [facility], [place] [address] [place], [place] [address]      Labs: BNP (last 3 results) No results for input(s): BNP in the last 8760 hours. Basic Metabolic Panel: Recent Labs  Lab 07/27/20 0915 07/27/20 1226 07/27/20 1429 07/28/20 0717 07/29/20 0254 07/30/20 0501  NA 135 136  --  127* 134* 135  K 4.7 5.7*  --  3.9 4.0 3.6  CL 102  --   --  97* 98 96*  CO2 17*  --   --  23 25 29   GLUCOSE 165*  --   --  176* 185* 143*  BUN 29*  --   --  12 7 10   CREATININE 1.59*  --   --  1.22 1.27* 1.27*  CALCIUM 8.9  --   --  8.4* 8.7* 8.4*  MG  --   --  1.9  --   --   --   PHOS  --   --  4.2  --   --   --    Liver Function Tests: Recent Labs  Lab 07/27/20 0915 07/28/20 0717 07/29/20 0254 07/30/20 0501  AST 940* 513* 232* 90*  ALT 492* 396* 249* 141*  ALKPHOS 134* 91 81 75  BILITOT 2.2* 3.1* 2.9* 2.0*  PROT 7.3 5.9* 6.0* 5.7*  ALBUMIN 4.1 3.3* 3.0* 2.7*   Recent Labs  Lab 07/27/20 0915  LIPASE 56*   No results for input(s): AMMONIA in the  last 168 hours. CBC: Recent Labs  Lab 07/27/20 0915 07/27/20 1226 07/28/20 0717 07/29/20 0254  WBC 8.0  --  7.9 9.9  NEUTROABS 5.4  --   --   --   HGB 18.4* 17.7* 13.7 13.8  HCT 53.2* 52.0 40.1 39.3  MCV 90.6  --  91.6 90.6  PLT 268  --  157 116*   Cardiac Enzymes: Recent Labs  Lab 07/28/20 0717  CKTOTAL 62   BNP: Invalid input(s): POCBNP CBG: No results for input(s):  GLUCAP in the last 168 hours. D-Dimer No results for input(s): DDIMER in the last 72 hours. Hgb A1c No results for input(s): HGBA1C in the last 72 hours. Lipid Profile No results for input(s): CHOL, HDL, LDLCALC, TRIG, CHOLHDL, LDLDIRECT in the last 72 hours. Thyroid function studies No results for input(s): TSH, T4TOTAL, T3FREE, THYROIDAB in the last 72 hours.  Invalid input(s): FREET3 Anemia work up No results for input(s): VITAMINB12, FOLATE, FERRITIN, TIBC, IRON, RETICCTPCT in the last 72 hours. Urinalysis    Component Value Date/Time   COLORURINE YELLOW 07/27/2020 1120   APPEARANCEUR CLEAR 07/27/2020 1120   LABSPEC 1.027 07/27/2020 1120   PHURINE 5.0 07/27/2020 1120   GLUCOSEU NEGATIVE 07/27/2020 1120   HGBUR NEGATIVE 07/27/2020 1120   BILIRUBINUR NEGATIVE 07/27/2020 1120   BILIRUBINUR Negative 09/04/2017 1339   KETONESUR NEGATIVE 07/27/2020 1120   PROTEINUR NEGATIVE 07/27/2020 1120   UROBILINOGEN 0.2 09/04/2017 1339   NITRITE NEGATIVE 07/27/2020 1120   LEUKOCYTESUR NEGATIVE 07/27/2020 1120   Sepsis Labs Invalid input(s): PROCALCITONIN,  WBC,  LACTICIDVEN Microbiology Recent Results (from the past 240 hour(s))  Resp Panel by RT-PCR (Flu A&B, Covid) Nasopharyngeal Swab     Status: None   Collection Time: 07/27/20 11:29 AM   Specimen: Nasopharyngeal Swab; Nasopharyngeal(NP) swabs in vial transport medium  Result Value Ref Range Status   SARS Coronavirus 2 by RT PCR NEGATIVE NEGATIVE Final    Comment: (NOTE) SARS-CoV-2 target nucleic acids are NOT DETECTED.  The SARS-CoV-2 RNA is  generally detectable in upper respiratory specimens during the acute phase of infection. The lowest concentration of SARS-CoV-2 viral copies this assay can detect is 138 copies/mL. A negative result does not preclude SARS-Cov-2 infection and should not be used as the sole basis for treatment or other patient management decisions. A negative result may occur with  improper specimen collection/handling, submission of specimen other than nasopharyngeal swab, presence of viral mutation(s) within the areas targeted by this assay, and inadequate number of viral copies(<138 copies/mL). A negative result must be combined with clinical observations, patient history, and epidemiological information. The expected result is Negative.  Fact Sheet for Patients:  EntrepreneurPulse.com.au  Fact Sheet for Healthcare Providers:  IncredibleEmployment.be  This test is no t yet approved or cleared by the [place] FDA and  has been authorized for detection and/or diagnosis of SARS-CoV-2 by FDA under an Emergency Use Authorization (EUA). This EUA will remain  in effect (meaning this test can be used) for the duration of the COVID-19 declaration under Section 564(b)(1) of the Act, 21 U.S.C.section 360bbb-3(b)(1), unless the authorization is terminated  or revoked sooner.       Influenza A by PCR NEGATIVE NEGATIVE Final   Influenza B by PCR NEGATIVE NEGATIVE Final    Comment: (NOTE) The Xpert Xpress SARS-CoV-2/FLU/RSV plus assay is intended as an aid in the diagnosis of influenza from Nasopharyngeal swab specimens and should not be used as a sole basis for treatment. Nasal washings and aspirates are unacceptable for Xpert Xpress SARS-CoV-2/FLU/RSV testing.  Fact Sheet for Patients: EntrepreneurPulse.com.au  Fact Sheet for Healthcare Providers: IncredibleEmployment.be  This test is not yet approved or cleared by the [place] FDA and has been authorized for detection and/or diagnosis of SARS-CoV-2 by FDA under an Emergency Use Authorization (EUA). This EUA will remain in effect (meaning this test can be used) for the duration of the COVID-19 declaration under Section 564(b)(1) of the Act, 21 U.S.C. section 360bbb-3(b)(1), unless the authorization is terminated or revoked.  Performed at [facility], [place] [address] [place], [place] [address]      Time coordinating discharge: Over 30 minutes  SIGNED:   Nolberto Hanlon, MD  [facility] 07/31/2020, 12:41 PM Pager   If 7PM-7AM, please contact night-coverage [email or web address] Password [email or web address]

## 2020-07-31 NOTE — Progress Notes (Signed)
Daily Rounding Note  07/31/2020, 9:06 AM  LOS: 3 days   SUBJECTIVE:   Chief complaint: abdominal pain.  Elevated LFTs     Feeling well.  Never had N/V.  BM's yest and this AM.  Pain much improved not completely gone.  Feels he can go home.  OBJECTIVE:         Vital signs in last 24 hours:    Temp:  [97.2 F (36.2 C)-98.6 F (37 C)] 98.2 F (36.8 C) (12/03 0530) Pulse Rate:  [78-96] 80 (12/03 0530) Resp:  [10-18] 18 (12/03 0530) BP: (114-142)/(78-109) 125/80 (12/03 0530) SpO2:  [94 %-99 %] 99 % (12/03 0530) Last BM Date: 07/31/20 Filed Weights   07/27/20 0858  Weight: 93.4 kg   General: facial rubor.  Alert.  comfortable   Heart: RRR Chest: clear bil.  No dyspnea Abdomen: soft, epigastric tenderness is mild  Extremities: no CCE Neuro/Psych:  Oriented x 3.  Alert.  No tremor or weakness.  Slightly anxious.    Intake/Output from previous day: 12/02 0701 - 12/03 0700 In: 301.5 [I.V.:301.5] Out: 0   Intake/Output this shift: Total I/O In: 120 [P.O.:120] Out: -   Lab Results: Recent Labs    07/29/20 0254  WBC 9.9  HGB 13.8  HCT 39.3  PLT 116*   BMET Recent Labs    07/29/20 0254 07/30/20 0501  NA 134* 135  K 4.0 3.6  CL 98 96*  CO2 25 29  GLUCOSE 185* 143*  BUN 7 10  CREATININE 1.27* 1.27*  CALCIUM 8.7* 8.4*   LFT Recent Labs    07/29/20 0254 07/30/20 0501  PROT 6.0* 5.7*  ALBUMIN 3.0* 2.7*  AST 232* 90*  ALT 249* 141*  ALKPHOS 81 75  BILITOT 2.9* 2.0*   PT/INR Recent Labs    07/30/20 0501  LABPROT 13.5  INR 1.1   Hepatitis Panel No results for input(s): HEPBSAG, HCVAB, HEPAIGM, HEPBIGM in the last 72 hours.  Studies/Results: No results found.   Scheduled Meds: . amLODipine  5 mg Oral Daily  . feeding supplement  237 mL Oral BID BM  . fluvoxaMINE  100 mg Oral QHS  . folic acid  1 mg Oral Daily  . methylphenidate  10 mg Oral TID WC  . morphine      . multivitamin with  minerals  1 tablet Oral Daily  . pantoprazole  40 mg Oral Daily  . sucralfate  1 g Oral BID  . thiamine  100 mg Oral Daily   Or  . thiamine  100 mg Intravenous Daily   Continuous Infusions: PRN Meds:.diazepam, diphenhydrAMINE, labetalol, ondansetron **OR** ondansetron (ZOFRAN) IV, traZODone   ASSESMENT:   *    Abdominal pain, n/v, elevated LFTs, pancreatic pseudocyst at TOP.   LFTs steadily improved.   Acute pancreatitis with concern for small area of necrosis attributed to alcohol in 01/2018. 07/27/2020 MRCP, MRI:   Hepatic steatosis.  Cystic lesion at TOPS/splenic hilum obscured by artifact from nearby metal fragment.  No cholelithiasis.   07/30/20 EGD: esoophagitis, large foodstuffs in stomach so unable to proceed with planned EUS.  Gastritis.  bx of stomach carafate added to PPI 12/2.    *    Extensive hepatic steatosis per CT.  Improving LFTs.  Multiple hepatitis serologies all negative.  ANA, smooth muscle antibody, immunoglobulin QN/A/B/G/M levels ordered by admitting physician, pending. Patient drinks regularly, perhaps more than he admits to which is 2 beers daily,  more recently over the Thanksgiving holiday.  *   Coagulopathy.  INR 1.8 >> vitamin K d 1 of 3 >> 1.3. Platelets normal.  *    AKI, overall improved.    *    Hyponatremia.  Na 127.  *    Panic disorder.  Multiple meds in place.      PLAN   *  Ok to discharge today.  GI will arrange office fup in 3 to 6 weeks. Pre visit cmet ordered..    *   Carafate bid for 4 weeks.  Protonix or other PPI 40 mg daily.      Azucena Freed  07/31/2020, 9:06 AM Phone [phone]

## 2020-07-31 NOTE — Progress Notes (Signed)
Pt IV removed, cathter intact. Tele removed, ccmd made aware. Pt understands d/c instructions. Pt has all belongings. Pt d/c via NT.

## 2020-08-01 ENCOUNTER — Encounter: Payer: Self-pay | Admitting: Gastroenterology

## 2020-08-01 LAB — IMMUNOGLOBULINS A/E/G/M, SERUM
IgA: 416 mg/dL — ABNORMAL HIGH (ref 90–386)
IgE (Immunoglobulin E), Serum: 63 IU/mL (ref 6–495)
IgG (Immunoglobin G), Serum: 1163 mg/dL (ref 603–1613)
IgM (Immunoglobulin M), Srm: 65 mg/dL (ref 20–172)

## 2020-08-02 ENCOUNTER — Encounter (HOSPITAL_COMMUNITY): Payer: Self-pay | Admitting: Gastroenterology

## 2020-08-03 ENCOUNTER — Telehealth: Payer: Self-pay

## 2020-08-03 NOTE — Telephone Encounter (Signed)
11 40 Wednesday please

## 2020-08-03 NOTE — Telephone Encounter (Signed)
Pt is able to come Wednesday, at 11:40. Maddy please schedule.

## 2020-08-03 NOTE — Telephone Encounter (Signed)
See below

## 2020-08-03 NOTE — Telephone Encounter (Signed)
Patient has been schedule.

## 2020-08-03 NOTE — Telephone Encounter (Cosign Needed)
Transition Care Management Follow-up Telephone Call  Date of discharge and from where: 07/31/20 from [facility]  How have you been since you were released from the hospital? better  Any questions or concerns? No  Items Reviewed:  Did the pt receive and understand the discharge instructions provided? Yes   Medications obtained and verified? Yes   Other? No   Any new allergies since your discharge? No   Dietary orders reviewed? Yes  Do you have support at home? Yes   Home Care and Equipment/Supplies: Were home health services ordered? not applicable If so, what is the name of the agency?   Has the agency set up a time to come to the patient's home? not applicable Were any new equipment or medical supplies ordered?  No What is the name of the medical supply agency?  Were you able to get the supplies/equipment? not applicable Do you have any questions related to the use of the equipment or supplies? No  Functional Questionnaire: (I = Independent and D = Dependent) ADLs: I  Bathing/Dressing- I  Meal Prep- I  Eating- I  Maintaining continence- I  Transferring/Ambulation- I  Managing Meds- I  Follow up appointments reviewed:   PCP Hospital f/u appt confirmed? Yes  Scheduled to see Dr Yong Channel on 08/05/20 @ 11:40 a.m.  [facility] f/u appt confirmed? Yes  Scheduled to see Dr Fabio Asa on 08/18/20 @ 9:30  Are transportation arrangements needed? No   If their condition worsens, is the pt aware to call PCP or go to the Emergency Dept.? Yes  Was the patient provided with contact information for the PCP's office or ED? Yes  Was to pt encouraged to call back with questions or concerns? Yes

## 2020-08-03 NOTE — Telephone Encounter (Signed)
Pt states he needs a hospital follow up. We have no 40 min slots open for this week. Please advise how to schedule.

## 2020-08-03 NOTE — Telephone Encounter (Signed)
Noted thanks °

## 2020-08-05 ENCOUNTER — Ambulatory Visit: Payer: 59 | Admitting: Family Medicine

## 2020-08-05 ENCOUNTER — Ambulatory Visit (INDEPENDENT_AMBULATORY_CARE_PROVIDER_SITE_OTHER): Payer: 59

## 2020-08-05 ENCOUNTER — Ambulatory Visit: Payer: Self-pay

## 2020-08-05 ENCOUNTER — Other Ambulatory Visit: Payer: Self-pay

## 2020-08-05 ENCOUNTER — Encounter: Payer: Self-pay | Admitting: Family Medicine

## 2020-08-05 VITALS — BP 128/88 | HR 76 | Ht 68.0 in | Wt 214.0 lb

## 2020-08-05 VITALS — BP 128/88 | HR 76 | Temp 98.4°F | Ht 68.0 in | Wt 214.0 lb

## 2020-08-05 DIAGNOSIS — M25561 Pain in right knee: Secondary | ICD-10-CM

## 2020-08-05 DIAGNOSIS — I10 Essential (primary) hypertension: Secondary | ICD-10-CM

## 2020-08-05 DIAGNOSIS — Z118 Encounter for screening for other infectious and parasitic diseases: Secondary | ICD-10-CM

## 2020-08-05 DIAGNOSIS — M25469 Effusion, unspecified knee: Secondary | ICD-10-CM

## 2020-08-05 DIAGNOSIS — Z113 Encounter for screening for infections with a predominantly sexual mode of transmission: Secondary | ICD-10-CM

## 2020-08-05 DIAGNOSIS — F3342 Major depressive disorder, recurrent, in full remission: Secondary | ICD-10-CM

## 2020-08-05 DIAGNOSIS — K863 Pseudocyst of pancreas: Secondary | ICD-10-CM

## 2020-08-05 DIAGNOSIS — B179 Acute viral hepatitis, unspecified: Secondary | ICD-10-CM

## 2020-08-05 DIAGNOSIS — K760 Fatty (change of) liver, not elsewhere classified: Secondary | ICD-10-CM

## 2020-08-05 DIAGNOSIS — E790 Hyperuricemia without signs of inflammatory arthritis and tophaceous disease: Secondary | ICD-10-CM | POA: Diagnosis not present

## 2020-08-05 DIAGNOSIS — F1020 Alcohol dependence, uncomplicated: Secondary | ICD-10-CM

## 2020-08-05 DIAGNOSIS — R7989 Other specified abnormal findings of blood chemistry: Secondary | ICD-10-CM | POA: Insufficient documentation

## 2020-08-05 LAB — URIC ACID: Uric Acid, Serum: 6.2 mg/dL (ref 4.0–7.8)

## 2020-08-05 LAB — CBC WITH DIFFERENTIAL/PLATELET
Basophils Absolute: 0.1 10*3/uL (ref 0.0–0.1)
Basophils Relative: 0.7 % (ref 0.0–3.0)
Eosinophils Absolute: 0.3 10*3/uL (ref 0.0–0.7)
Eosinophils Relative: 4.7 % (ref 0.0–5.0)
HCT: 39.7 % (ref 39.0–52.0)
Hemoglobin: 13.4 g/dL (ref 13.0–17.0)
Lymphocytes Relative: 43.5 % (ref 12.0–46.0)
Lymphs Abs: 3.1 10*3/uL (ref 0.7–4.0)
MCHC: 33.9 g/dL (ref 30.0–36.0)
MCV: 92.8 fl (ref 78.0–100.0)
Monocytes Absolute: 0.5 10*3/uL (ref 0.1–1.0)
Monocytes Relative: 7.4 % (ref 3.0–12.0)
Neutro Abs: 3.1 10*3/uL (ref 1.4–7.7)
Neutrophils Relative %: 43.7 % (ref 43.0–77.0)
Platelets: 408 10*3/uL — ABNORMAL HIGH (ref 150.0–400.0)
RBC: 4.27 Mil/uL (ref 4.22–5.81)
RDW: 12.4 % (ref 11.5–15.5)
WBC: 7.1 10*3/uL (ref 4.0–10.5)

## 2020-08-05 LAB — COMPREHENSIVE METABOLIC PANEL
ALT: 44 U/L (ref 0–53)
AST: 26 U/L (ref 0–37)
Albumin: 4.2 g/dL (ref 3.5–5.2)
Alkaline Phosphatase: 78 U/L (ref 39–117)
BUN: 10 mg/dL (ref 6–23)
CO2: 32 mEq/L (ref 19–32)
Calcium: 9.5 mg/dL (ref 8.4–10.5)
Chloride: 94 mEq/L — ABNORMAL LOW (ref 96–112)
Creatinine, Ser: 1.25 mg/dL (ref 0.40–1.50)
GFR: 72.71 mL/min (ref >60.00)
Glucose, Bld: 133 mg/dL — ABNORMAL HIGH (ref 70–99)
Potassium: 4.2 mEq/L (ref 3.5–5.1)
Sodium: 134 mEq/L — ABNORMAL LOW (ref 135–145)
Total Bilirubin: 0.5 mg/dL (ref 0.2–1.2)
Total Protein: 7.5 g/dL (ref 6.0–8.3)

## 2020-08-05 LAB — C-REACTIVE PROTEIN: CRP: 1.7 mg/dL (ref 0.5–20.0)

## 2020-08-05 LAB — SEDIMENTATION RATE: Sed Rate: 40 mm/hr — ABNORMAL HIGH (ref 0–15)

## 2020-08-05 MED ORDER — TRAMADOL HCL 50 MG PO TABS
25.0000 mg | ORAL_TABLET | Freq: Four times a day (QID) | ORAL | 0 refills | Status: DC | PRN
Start: 2020-08-05 — End: 2020-08-12

## 2020-08-05 NOTE — Progress Notes (Signed)
Phone [phone]   Subjective:  Glenn [name] is a 39 y.o. year old very pleasant male patient who presents for transitional care management and hospital follow up for acute hepatitis/elevated LFTs (AST up to 940, alt to 492, alk phos to 134 and bilirubin to 2.2 and lipase minimally elevated)/abdominal pain. Patient was hospitalized from 07/27/20 to 07/31/20. A TCM phone call was completed on 08/03/20. Medical complexity high  Patient presented to the hospital with significant abdominal pain.  He was found to have elevated LFTs.  Initial imaging without gallstone disease on initial ultrasound July 27, 2020-fatty liver was noted.  Patient had EGD with endoscopic ultrasound to rule out chronic pancreatitis and evaluate a pancreatic cyst.  CT of the abdomen found a pseudocyst between the pancreatic tail and the spleen-fatty liver was again noted and radiology recommended repeat imaging 6 to 12 months to assess for stability.  MRI of abdomen/MRCP was obtained but cystic lesion was obscured by susceptibility artifact from metallic fragment from shrapnel in left abdomen endoscopic ultrasound was unable to be performed due to large amount of food residue.  Concern for H pylori with erythematous mucosa in gastric body and biopsy was performed- thankfully no H. Pylori noted but did have reactive gastropathy.  Patient was started on Carafate.  Hepatitis serologies were negative.  ANA, smooth muscle antibody, immunoglobulin nightly/A/P/G/M levels ordered and were pending- resulted since that time and largely normal other than IgA slightly elevated at 416.  Recommended PCP or GI follow-up.  Thankfully LFTs were improving by time of discharge. Lab Results  Component Value Date   ALT 141 (H) 07/30/2020   AST 90 (H) 07/30/2020   ALKPHOS 75 07/30/2020   BILITOT 2.0 (H) 07/30/2020   Patient was strongly recommended to discontinue all alcohol. Had only had a few on thanksgiving and very sparing 2 (he will stop this  now) Thankfully no signs of withdrawal during hospitalization.  Patient was recommended to continue folate and thiamine.  He was counseled on alcohol cessation continue on Protonix 40 mg daily.  Carafate 1 g twice daily for 4 weeks.  Plan for EGD in 3 weeks be believes- I thought notes said 3 months  For hypertension-patient was continued on amlodipine.  Losartan was held during hospitalization as blood pressure was stable.  Patient remains off losartan and reports good control blood pressure at home  For depression patient was continued on fluvoxamine (had been working with Dr. Creig Hines now with new psychiatrist)-they believe he was in remission. Add meds they thought could be affecint things  For insomnia patient was continued on trazodone through psychiatry  For hyponatremia-noted on admission but improved with IV hydration.  Have been seeing psychiatry and methylphenidate was held due to possible liver issues Depression screen [facility] 2/9 08/05/2020 12/03/2018 09/04/2017  Decreased Interest 0 3 0  Down, Depressed, Hopeless 0 - 1  PHQ - 2 Score 0 3 1  Altered sleeping 0 3 -  Tired, decreased energy 3 3 -  Change in appetite 0 3 -  Feeling bad or failure about yourself  0 3 -  Trouble concentrating 0 3 -  Moving slowly or fidgety/restless 0 0 -  Suicidal thoughts 0 1 -  PHQ-9 Score 3 19 -  Difficult doing work/chores Somewhat difficult Somewhat difficult -   CT of abdomen pelvis July 27, 2020 was independently reviewed-heart and lungs largely normal.  Decreased attenuation throughout liver concerning for fatty liver.  Gallbladder appears normal.  Spleen is normal.  Pancreas  normal other than cyst noted along pancreatic tail adjacent to the spleen.  Adrenal glands normal.  Kidneys largely normal-possible cyst in bilateral upper poles.  Genitourinary exam appears normal appendix is normal.  Stomach and bowels appear largely normal.  No vascular abnormalities noted.  No bony  abnormalities.  Patient denies any abdominal pain today-his main concern is Right knee pain . Started on Saturday the day after getting out of hospital. Does not remember fall or injury in hospital or afterwards. Somewhat swollen and hurts above the knee and cannot bend fully. No calf pain or swelling. He states had compression socks in hospital and he ambulated. Pain moderate to severe- rates 6/10 a the moment. On exam extremely tender and edematous without redness or warmth. Even in musculature above the knee has pain. No pain in calf or hamstring. No issues at patella. Has not taken any aleve/ibuprofen. Not sexually active and has had STD testing since last sexual activity which was negative  In regards to fatty liver. Not immune to hepatitis B or A based off labs.  We will need to complete these at a later date-knee pain to be improved need to do this later   See problem oriented charting as well  Past Medical History-  Patient Active Problem List   Diagnosis Date Noted  . Pancreatic pseudocyst 08/05/2020    Priority: High  . Acute hepatitis 07/27/2020    Priority: High  . Hepatic steatosis 02/11/2018    Priority: High  . Alcohol use disorder, moderate, in controlled environment ([place]) 02/11/2018    Priority: High  . Chronic fatigue 10/28/2018    Priority: Medium  . GAD (generalized anxiety disorder) 07/02/2018    Priority: Medium  . Major depression in full remission ([place]) 07/02/2018    Priority: Medium  . Tobacco abuse 02/24/2015    Priority: Medium  . Essential hypertension, benign 02/18/2014    Priority: Medium  . Panic disorder 12/10/2013    Priority: Medium  . Hyperglycemia 02/11/2018    Priority: Low  . Pancreatitis, alcoholic, acute 07/01/1593    Priority: Low  . Abdominal pain 07/27/2020  . Mood disorder with depressive features due to general medical condition 10/28/2018  . Acute pain of right knee 05/14/2018  . Elevated uric acid in blood 03/16/2018     Medications- reviewed and updated  A medical reconciliation was performed comparing current medicines to hospital discharge medications. Current Outpatient Medications  Medication Sig Dispense Refill  . amLODipine (NORVASC) 5 MG tablet Take 1 tablet (5 mg total) by mouth daily. 30 tablet 0  . diazepam (VALIUM) 10 MG tablet Take 1 tablet (10 mg total) by mouth 2 (two) times daily as needed for anxiety (panic). 60 tablet 5  . fluvoxaMINE (LUVOX) 100 MG tablet TAKE 1 TABLET(100 MG) BY MOUTH AT BEDTIME (Patient taking differently: Take 100 mg by mouth at bedtime. ) 30 tablet 5  . folic acid (FOLVITE) 1 MG tablet Take 1 tablet (1 mg total) by mouth daily. 30 tablet 0  . OVER THE COUNTER MEDICATION Take 6 capsules by mouth See admin instructions. Pt takes fruit and vegetable supplement. 3 fruit capsules and 3 vegetable capsules once daily    . OVER THE COUNTER MEDICATION Take 1 Scoop by mouth daily. Super beets supplement powder.    . pantoprazole (PROTONIX) 40 MG tablet Take 1 tablet (40 mg total) by mouth daily. 30 tablet 1  . sucralfate (CARAFATE) 1 GM/10ML suspension Take 10 mLs (1 g total) by mouth 2 (  two) times daily. 600 mL 0  . thiamine 100 MG tablet Take 1 tablet (100 mg total) by mouth daily. 30 tablet 0  . traZODone (DESYREL) 100 MG tablet TAKE 1 TABLET(100 MG) BY MOUTH AT BEDTIME AS NEEDED FOR SLEEP (Patient taking differently: Take 100 mg by mouth at bedtime as needed for sleep. ) 30 tablet 5  . traMADol (ULTRAM) 50 MG tablet Take 0.5 tablets (25 mg total) by mouth every 6 (six) hours as needed for moderate pain or severe pain. 10 tablet 0   No current facility-administered medications for this visit.   Objective  Objective:  BP 128/88   Pulse 76   Temp 98.4 F (36.9 C) (Temporal)   Ht '5\' 8"'  (1.727 m)   Wt 214 lb (97.1 kg)   SpO2 99%   BMI 32.54 kg/m  Gen: NAD, resting comfortably, with movement appears very uncomfortable regarding right knee CV: RRR no murmurs rubs or  gallops Lungs: CTAB no crackles, wheeze, rhonchi Abdomen: soft/nontender/nondistended/normal bowel sounds. No rebound or guarding.  Ext: no edema Skin: warm, dry MSK: Effusion of right knee-patient very tender to palpation of joint line as well as over upper portions of patella-also tender into the musculature above the knee/bottom portion of quadriceps.  No patellar tendon tenderness.  Limited range of motion due to pain   Assessment and Plan:   #Hospital follow-up/TCM  Acute hepatitis Acute hepatitis of unknown etiology. Was taken off ADD meds which I think is the right move at least acutely and with concern about alcohol intake.   Extensive work-up during hospitalization unrevealing but alcohol may be a key factor though patient reports minimal intake in general 2 a day and more over thanksgiving- we discussed elimininating alcohol with fatty liver findings. .  In regards to abdominal pain did appear to have gastritis-improved on PPI and Carafate-he is going to complete course of this and follow-up with GI.  LFTs significantly trended down during hospitalization.  History of pancreatitis but no evidence of recurrence-very low level of lipase elevation  From GI notes before discharge "Extensive hepatic steatosis per CT. Improving LFTs. Multiple hepatitis serologies all negative. ANA, smooth muscle antibody, immunoglobulin QN/A/B/G/M levels ordered by admitting physician, pending. Patient drinks regularly, perhaps more than he admits to which is2 beers daily,more recently over the Thanksgiving holiday."  We will update LFTs today.  Hepatic steatosis Fatty liver noted on CT.  We discussed importance of alcohol avoidance, healthy eating's, regular exercise, weight loss. -Would recommend Twinrix vaccination but would like for knee pain to be in a more stable place first-consider at follow-up  Alcohol use disorder, moderate, in controlled environment ([place]) Thankfully no withdrawal during  hospitalization.  Patient reports he rarely drinks.  In the hospital looks like he reported to a day or less with more over Thanksgiving-he did report increased amount over Thanksgiving.  With LFT elevation/hepatitis we discussed complete cessation.  Would strongly consider program like AA  Major depression in full remission ([place]) Appears to be in full remission on fluvoxamine with PHQ-9 under 5.  Also using trazodone for insomnia.  Continue psychiatry follow-up and current medication  Essential hypertension, benign Controlled on losartan 100 mg alone.  Taken off of amlodipine in the hospital and still remains reasonably controlled-he agrees to do some monitoring at home-ideally would like to see him at least less than 135/85 on home readings on average.  Consider restart if needed  Pancreatic pseudocyst Noted during hospitalization November 2021 with 6 to 59-monthrepeat.  I set a reminder to check in 6 months  Acute pain of right knee Interestingly patient had similar episode of knee pain in 2019 after hospitalization.  No clear source of infection without hospitalization but I wondered about a possible reactive arthritis-we will get CBC/CMP and UA-also add on CRP and ESR.  Patient in significant pain will do urgent referral to sports medicine. Lab Results  Component Value Date   LABURIC 7.7 03/06/2018  He has had elevated uric acid level in the past but this does not obviously appear to be gout to me-would like sports medicine's opinion.  No calf pain or swelling-doubt DVT I do not think we need work-up at this time. -I do not think reactive arthritis as the cause but will check urine cytology for gonorrhea and chlamydia as well -Try Voltaren gel for pain control.  Also gave him backup tramadol but want him to use very sparingly and only half tablet especially with trazodone and SNRI on board.  Should avoid driving for 6 to 8 hours after tramadol and should remain alcohol free  Recommended  follow up: Return in about 3 months (around 11/03/2020) for follow up- or sooner if needed. Future Appointments  Date Time Provider [place]  08/05/2020  2:30 PM Gregor Hams, MD [facility] None  08/18/2020  9:30 AM Levin Erp, PA [facility]  11/06/2020 10:00 AM Marin Olp, MD [facility]    Lab/Order associations:   ICD-10-CM   1. Acute hepatitis  B17.9   2. Hepatic steatosis  K76.0   3. Pancreatic pseudocyst  K86.3   4. Acute pain of right knee  M25.561 Ambulatory referral to Sports Medicine    C-reactive protein    Sedimentation rate    CANCELED: Ambulatory referral to Sports Medicine  5. Essential hypertension, benign  I10 CBC With Differential/Platelet    COMPLETE METABOLIC PANEL WITH GFR  6. Alcohol use disorder, moderate, in controlled environment ([place])  F10.20   7. Recurrent major depressive disorder, in full remission ([place])  F33.42   8. Screening for chlamydial disease  Z11.8 Urine cytology ancillary only  9. Screening for gonorrhea  Z11.3 Urine cytology ancillary only    Meds ordered this encounter  Medications  . traMADol (ULTRAM) 50 MG tablet    Sig: Take 0.5 tablets (25 mg total) by mouth every 6 (six) hours as needed for moderate pain or severe pain.    Dispense:  10 tablet    Refill:  0    Return precautions advised.  [name], MD

## 2020-08-05 NOTE — Assessment & Plan Note (Signed)
Thankfully no withdrawal during hospitalization.  Patient reports he rarely drinks.  In the hospital looks like he reported to a day or less with more over Thanksgiving-he did report increased amount over Thanksgiving.  With LFT elevation/hepatitis we discussed complete cessation.  Would strongly consider program like AA

## 2020-08-05 NOTE — Assessment & Plan Note (Addendum)
Acute hepatitis of unknown etiology. Was taken off ADD meds which I think is the right move at least acutely and with concern about alcohol intake.   Extensive work-up during hospitalization unrevealing but alcohol may be a key factor though patient reports minimal intake in general 2 a day and more over thanksgiving- we discussed elimininating alcohol with fatty liver findings. .  In regards to abdominal pain did appear to have gastritis-improved on PPI and Carafate-he is going to complete course of this and follow-up with GI.  LFTs significantly trended down during hospitalization.  History of pancreatitis but no evidence of recurrence-very low level of lipase elevation  From GI notes before discharge "Extensive hepatic steatosis per CT. Improving LFTs. Multiple hepatitis serologies all negative. ANA, smooth muscle antibody, immunoglobulin QN/A/B/G/M levels ordered by admitting physician, pending. Patient drinks regularly, perhaps more than he admits to which is2 beers daily,more recently over the Thanksgiving holiday."  We will update LFTs today.

## 2020-08-05 NOTE — Patient Instructions (Signed)
Thank you for coming in today. Please use voltaren gel up to 4x daily for pain as needed.   Call or go to the ER if you develop a large red swollen joint with extreme pain or oozing puss.   Please get labs today before you leave

## 2020-08-05 NOTE — Assessment & Plan Note (Signed)
Noted during hospitalization November 2021 with 6 to 18-month repeat.  I set a reminder to check in 6 months

## 2020-08-05 NOTE — Assessment & Plan Note (Signed)
Controlled on losartan 100 mg alone.  Taken off of amlodipine in the hospital and still remains reasonably controlled-he agrees to do some monitoring at home-ideally would like to see him at least less than 135/85 on home readings on average.  Consider restart if needed

## 2020-08-05 NOTE — Assessment & Plan Note (Signed)
Appears to be in full remission on fluvoxamine with PHQ-9 under 5.  Also using trazodone for insomnia.  Continue psychiatry follow-up and current medication

## 2020-08-05 NOTE — Assessment & Plan Note (Signed)
Fatty liver noted on CT.  We discussed importance of alcohol avoidance, healthy eating's, regular exercise, weight loss. -Would recommend Twinrix vaccination but would like for knee pain to be in a more stable place first-consider at follow-up

## 2020-08-05 NOTE — Progress Notes (Signed)
Subjective:    I'm seeing this patient as a consultation for:  Dr. Yong Channel. Note will be routed back to referring provider/PCP.  CC: R knee pain  I, Molly Weber, LAT, ATC, am serving as scribe for Dr. [name].  HPI: Pt is a 39 y/o male presenting w/ c/o R knee pain since last Saturday after getting out of the hospital last Friday.  He recalls no MOI.  He locates his pain to his R ant knee the ant patella and superior to the patella.  He was seen previously by Dr. Paulla Fore in [place] 2019 for his R knee and ultimately found to have a R fibular head fx per knee MRI.  Patient was hospitalized for hepatitis thought to be due to alcohol consumption.  However he did not have enough alcohol consumption to make the degree of hepatitis make sense.  He is still being worked up for it.  He does have a history of elevated uric acid in the past but no formal diagnosis of gout.  R knee swelling: yes per Dr. Yong Channel R knee mechanical symptoms: no Aggravating factors: walking; weight-bearing; R knee flexion AROM Treatments tried: [place]  Diagnostic testing: R knee MRI- 03/31/18; R knee XR- 03/06/18  Past medical history, Surgical history, Family history, Social history, Allergies, and medications have been entered into the medical record, reviewed.   Review of Systems: No new headache, visual changes, nausea, vomiting, diarrhea, constipation, dizziness, abdominal pain, skin rash, fevers, chills, night sweats, weight loss, swollen lymph nodes, body aches, joint swelling, muscle aches, chest pain, shortness of breath, mood changes, visual or auditory hallucinations.   Objective:    Vitals:   08/05/20 1428  BP: 128/88  Pulse: 76  SpO2: 99%   General: Well Developed, well nourished, and in no acute distress.  Neuro/Psych: Alert and oriented x3, extra-ocular muscles intact, able to move all 4 extremities, sensation grossly intact. Skin: Warm and dry, no rashes noted.  Respiratory: Not using accessory  muscles, speaking in full sentences, trachea midline.  Cardiovascular: Pulses palpable, no extremity edema. Abdomen: Does not appear distended. MSK: Right knee largely normal-appearing with minimal effusion. Range of motion 3-100 degrees. Diffusely tender. Palpable squeak at patella and quad tendon. Stable ligamentous exam. Intact strength.  Lab and Radiology Results X-ray images right knee obtained today personally and independently interpreted Mild medial compartment DJD.  No acute fracture. Await formal radiology review   Procedure: Real-time Ultrasound Guided Injection of right knee superior lateral patellar Device: Philips Affiniti 50G Images permanently stored and available for review in PACS Ultrasound examination prior to injection reveals trace joint effusion.  Normal-appearing medial and lateral joint line.  Intact quad and patellar tendon.  Small Baker's cyst. Verbal informed consent obtained.  Discussed risks and benefits of procedure. Warned about infection bleeding damage to structures skin hypopigmentation and fat atrophy among others. Patient expresses understanding and agreement Time-out conducted.   Noted no overlying erythema, induration, or other signs of local infection.   Skin prepped in a sterile fashion.   Local anesthesia: Topical Ethyl chloride.   With sterile technique and under real time ultrasound guidance:  40 mg of Kenalog and 2 mL of Marcaine injected into superior patellar space. Fluid seen entering the joint capsule.   Completed without difficulty   Pain moderately resolved suggesting accurate placement of the medication.   Advised to call if fevers/chills, erythema, induration, drainage, or persistent bleeding.   Images permanently stored and available for review in the ultrasound  unit.  Impression: Technically successful ultrasound guided injection.  Lab Results  Component Value Date   IRON 187 (H) 07/28/2020   TIBC 193 (L) 07/28/2020    FERRITIN 6,880 (H) 07/28/2020      Impression and Recommendations:    Assessment and Plan: 39 y.o. male with right knee pain.  Etiology is somewhat unclear at this time.  Concern for rheumatologic process such as gout or inflammatory arthropathy.  He does have a small Baker's cyst but is not painful in this region therefore I do not think that is the main source of pain.  I think gout is most likely.  Patient is already been arranged to have a rheumatologic work-up.  Plan for labs listed below in addition to ESR and CRP arranged by PCP.  This should be helpful in diagnosis.  Treatment standpoint proceed with steroid injection.  Also recommend topical Voltaren gel.  Of note patient had significantly elevated iron stores recently with ferritin at 6880 with elevated iron.  This is concerning for hemochromatosis and could potentially explain some degree of his liver inflammation.  This should be considered as part of his fundamental diagnosis.  PDMP not reviewed this encounter. Orders Placed This Encounter  Procedures  . [place] LIMITED JOINT SPACE STRUCTURES LOW RIGHT(NO LINKED CHARGES)    Order Specific Question:   Reason for Exam (SYMPTOM  OR DIAGNOSIS REQUIRED)    Answer:   R knee pain    Order Specific Question:   Preferred imaging location?    Answer:   [facility]  . DG Knee AP/LAT W/Sun[facility] Right    Standing Status:   Future    Number of Occurrences:   1    Standing Expiration Date:   09/05/2020    Order Specific Question:   Reason for Exam (SYMPTOM  OR DIAGNOSIS REQUIRED)    Answer:   R knee pain    Order Specific Question:   Preferred imaging location?    Answer:   [facility]  . Comp Met (CMET)    Standing Status:   Future    Number of Occurrences:   1    Standing Expiration Date:   08/05/2021  . Uric acid    Standing Status:   Future    Number of Occurrences:   1    Standing Expiration Date:   08/05/2021  . HLA-B27 antigen    Standing Status:    Future    Number of Occurrences:   1    Standing Expiration Date:   08/05/2021  . LUPUS(12) PANEL    Standing Status:   Future    Number of Occurrences:   1    Standing Expiration Date:   08/05/2021  . Cyclic citrul peptide antibody, IgG    Standing Status:   Future    Number of Occurrences:   1    Standing Expiration Date:   08/05/2021   No orders of the defined types were placed in this encounter.   Discussed warning signs or symptoms. Please see discharge instructions. Patient expresses understanding.   The above documentation has been reviewed and is accurate and complete [name], M.D.

## 2020-08-05 NOTE — Patient Instructions (Addendum)
Blood pressure high normal today but ok to not add amlodipine back yet- please keep an eye on this at home (could get new automatic cuff) and if over 135/85 at home consider adding amlodipine back  Thrilled sports medicine can see you today with level of pain. I am checking kidney function to see if oral nsaids would be ok like ibuprofen/aleve/motrin but as you said- want to be cautious with recent GI issues. Could try voltaren gel over the counter 4x a day. Can try tramadol half tablet if voltaren not enough. Sports medicine visit is to try to figure out why pain so bad. I wonder about reactive arthritis but no clear infection as cause of symptoms with recent hospitalization. Checking chlamydia but you are low risk.   Please stop by lab before you go If you have mychart- we will send your results within 3 business days of [place] receiving them.  If you do not have mychart- we will call you about results within 5 business days of [place] receiving them.  *please note we are currently using [facility] which has a longer processing time than [place] typically so labs may not come back as quickly as in the past *please also note that you will see labs on mychart as soon as they post. I will later go in and write notes on them- will say "notes from Dr. Yong Channel"  Recommended follow up: Return in about 3 months (around 11/03/2020) for follow up- or sooner if needed. such as if need follow up on knee or the liver tests.

## 2020-08-05 NOTE — Assessment & Plan Note (Addendum)
Interestingly patient had similar episode of knee pain in 2019 after hospitalization.  No clear source of infection without hospitalization but I wondered about a possible reactive arthritis-we will get CBC/CMP and UA-also add on CRP and ESR.  Patient in significant pain will do urgent referral to sports medicine. Lab Results  Component Value Date   LABURIC 7.7 03/06/2018  He has had elevated uric acid level in the past but this does not obviously appear to be gout to me-would like sports medicine's opinion.  No calf pain or swelling-doubt DVT I do not think we need work-up at this time. -I do not think reactive arthritis as the cause but will check urine cytology for gonorrhea and chlamydia as well -Try Voltaren gel for pain control.  Also gave him backup tramadol but want him to use very sparingly and only half tablet especially with trazodone and SNRI on board.  Should avoid driving for 6 to 8 hours after tramadol and should remain alcohol free

## 2020-08-06 ENCOUNTER — Ambulatory Visit: Payer: 59 | Admitting: Physician Assistant

## 2020-08-06 LAB — URINE CYTOLOGY ANCILLARY ONLY
Chlamydia: NEGATIVE
Comment: NEGATIVE
Comment: NORMAL
Neisseria Gonorrhea: NEGATIVE

## 2020-08-06 NOTE — Progress Notes (Signed)
X-ray right knee shows a little bit of fluid in the knee joint.  No fracture or severe arthritis.

## 2020-08-06 NOTE — Progress Notes (Signed)
Uric acid is not very elevated. I do not think gout is causing your pain.  Other labs are still pending

## 2020-08-10 LAB — LUPUS(12) PANEL
Anti Nuclear Antibody (ANA): NEGATIVE
ENA SM Ab Ser-aCnc: <1 AI
Rheumatoid fact SerPl-aCnc: <14 IU/mL (ref <14)
Ribosomal P Protein Ab: <1 AI
SM/RNP: <1 AI
SSA (Ro) (ENA) Antibody, IgG: <1 AI
SSB (La) (ENA) Antibody, IgG: <1 AI
Scleroderma (Scl-70) (ENA) Antibody, IgG: <1 AI
Thyroperoxidase Ab SerPl-aCnc: <1 IU/mL (ref <9)
ds DNA Ab: <1 IU/mL

## 2020-08-10 LAB — CYCLIC CITRUL PEPTIDE ANTIBODY, IGG: Cyclic Citrullin Peptide Ab: <16 UNITS

## 2020-08-10 LAB — HLA-B27 ANTIGEN: HLA-B27 Antigen: NEGATIVE

## 2020-08-12 ENCOUNTER — Other Ambulatory Visit: Payer: Self-pay | Admitting: Family Medicine

## 2020-08-13 ENCOUNTER — Other Ambulatory Visit: Payer: Self-pay

## 2020-08-13 ENCOUNTER — Other Ambulatory Visit: Payer: 59

## 2020-08-13 DIAGNOSIS — M329 Systemic lupus erythematosus, unspecified: Secondary | ICD-10-CM

## 2020-08-13 DIAGNOSIS — M25569 Pain in unspecified knee: Secondary | ICD-10-CM

## 2020-08-13 DIAGNOSIS — I10 Essential (primary) hypertension: Secondary | ICD-10-CM

## 2020-08-13 DIAGNOSIS — IMO0002 Reserved for concepts with insufficient information to code with codable children: Secondary | ICD-10-CM

## 2020-08-13 DIAGNOSIS — M25469 Effusion, unspecified knee: Secondary | ICD-10-CM

## 2020-08-13 DIAGNOSIS — E790 Hyperuricemia without signs of inflammatory arthritis and tophaceous disease: Secondary | ICD-10-CM

## 2020-08-16 NOTE — Progress Notes (Signed)
Further rheumatologic labs look normal.

## 2020-08-17 LAB — LUPUS(12) PANEL
Anti Nuclear Antibody (ANA): NEGATIVE
C3 Complement: 137 mg/dL (ref 82–185)
C4 Complement: 34 mg/dL (ref 15–53)
ENA SM Ab Ser-aCnc: <1 AI
Rheumatoid fact SerPl-aCnc: <14 IU/mL (ref <14)
Ribosomal P Protein Ab: <1 AI
SM/RNP: <1 AI
SSA (Ro) (ENA) Antibody, IgG: <1 AI
SSB (La) (ENA) Antibody, IgG: <1 AI
Scleroderma (Scl-70) (ENA) Antibody, IgG: <1 AI
Thyroperoxidase Ab SerPl-aCnc: <1 IU/mL (ref <9)
ds DNA Ab: <1 IU/mL

## 2020-08-18 ENCOUNTER — Other Ambulatory Visit (INDEPENDENT_AMBULATORY_CARE_PROVIDER_SITE_OTHER): Payer: 59

## 2020-08-18 ENCOUNTER — Encounter: Payer: Self-pay | Admitting: Physician Assistant

## 2020-08-18 ENCOUNTER — Ambulatory Visit: Payer: 59 | Admitting: Physician Assistant

## 2020-08-18 VITALS — BP 138/72 | HR 66 | Ht 68.0 in | Wt 213.8 lb

## 2020-08-18 DIAGNOSIS — K863 Pseudocyst of pancreas: Secondary | ICD-10-CM

## 2020-08-18 DIAGNOSIS — R7989 Other specified abnormal findings of blood chemistry: Secondary | ICD-10-CM

## 2020-08-18 DIAGNOSIS — K209 Esophagitis, unspecified without bleeding: Secondary | ICD-10-CM

## 2020-08-18 LAB — COMPREHENSIVE METABOLIC PANEL
ALT: 21 U/L (ref 0–53)
AST: 17 U/L (ref 0–37)
Albumin: 4.5 g/dL (ref 3.5–5.2)
Alkaline Phosphatase: 70 U/L (ref 39–117)
BUN: 20 mg/dL (ref 6–23)
CO2: 30 mEq/L (ref 19–32)
Calcium: 9.3 mg/dL (ref 8.4–10.5)
Chloride: 100 mEq/L (ref 96–112)
Creatinine, Ser: 1.33 mg/dL (ref 0.40–1.50)
GFR: 67.48 mL/min (ref >60.00)
Glucose, Bld: 89 mg/dL (ref 70–99)
Potassium: 4 mEq/L (ref 3.5–5.1)
Sodium: 137 mEq/L (ref 135–145)
Total Bilirubin: 0.4 mg/dL (ref 0.2–1.2)
Total Protein: 7.7 g/dL (ref 6.0–8.3)

## 2020-08-18 LAB — PROTIME-INR
INR: 1 ratio (ref 0.8–1.0)
Prothrombin Time: 11.5 s (ref 9.6–13.1)

## 2020-08-18 MED ORDER — PANTOPRAZOLE SODIUM 40 MG PO TBEC: 40.0000 mg | DELAYED_RELEASE_TABLET | Freq: Every day | ORAL | 4 refills | Status: DC

## 2020-08-18 NOTE — Progress Notes (Signed)
Lupus panel is negative

## 2020-08-18 NOTE — Patient Instructions (Addendum)
If you are age 39 or older, your body mass index should be between 23-30. Your Body mass index is 32.51 kg/m. If this is out of the aforementioned range listed, please consider follow up with your Primary Care Provider.  If you are age 62 or younger, your body mass index should be between 19-25. Your Body mass index is 32.51 kg/m. If this is out of the aformentioned range listed, please consider follow up with your Primary Care Provider.   You have been scheduled for an MRI at [facility] 1st floor  on 01/22/21. Your appointment time is 9:00am. Please arrive 15 minutes prior to your appointment time for registration purposes. Please make certain not to have anything to eat or drink 6 hours prior to your test. In addition, if you have any metal in your body, have a pacemaker or defibrillator, please be sure to let your ordering physician know. This test typically takes 45 minutes to 1 hour to complete. Should you need to reschedule, please call [phone] to do so.  Your provider has requested that you go to the basement level for lab work before leaving today. Press "B" on the elevator. The lab is located at the first door on the left as you exit the elevator.   Thank you for choosing me and [facility].  Ellouise Newer, PA-C

## 2020-08-18 NOTE — Progress Notes (Signed)
Attending Physician's Attestation   I have reviewed the chart.   I agree with the Advanced Practitioner's note, impression, and recommendations with any updates as below. We will see how repeat LFTs are looking.  Based on the patient's prior imaging study I would recommend we proceed with an EGD/EUS for follow-up of healing and to evaluate the pancreatic cystic lesion.  We can decide on MRI/MRCP follow-up thereafter.   Justice Britain, MD [facility] Advanced Endoscopy Office # [phone]

## 2020-08-18 NOTE — Addendum Note (Signed)
Addended by: Darrall Dears on: 08/18/2020 09:53 AM   Modules accepted: Orders
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
## 2020-08-22 ENCOUNTER — Other Ambulatory Visit: Payer: Self-pay | Admitting: Family Medicine

## 2020-08-23 ENCOUNTER — Other Ambulatory Visit: Payer: Self-pay | Admitting: Family Medicine

## 2020-08-25 ENCOUNTER — Other Ambulatory Visit: Payer: Self-pay | Admitting: Family Medicine

## 2020-08-25 NOTE — Telephone Encounter (Signed)
LAST APPOINTMENT DATE: 08/05/2020   NEXT APPOINTMENT DATE: 11/06/2020    LAST REFILL:  08/12/2020  QTY: 10

## 2020-08-26 MED ORDER — TRAMADOL HCL 50 MG PO TABS: 50.0000 mg | ORAL_TABLET | Freq: Three times a day (TID) | ORAL | 0 refills | Status: DC | PRN

## 2020-08-27 ENCOUNTER — Telehealth: Payer: Self-pay | Admitting: Physician Assistant

## 2020-08-27 NOTE — Telephone Encounter (Signed)
Glenn [name] wants to know if you want him to continue the Folic Acid. Please advise, thank you.

## 2020-08-27 NOTE — Telephone Encounter (Signed)
Pt is requesting a call back from a nurse to discuss which medications he is supposed to be taking.

## 2020-08-31 NOTE — Telephone Encounter (Signed)
Yes lets have him continue for now. Thanks-JLL

## 2020-08-31 NOTE — Telephone Encounter (Signed)
I have left him a detailed voice mail to continue the folic acid.

## 2020-09-17 ENCOUNTER — Other Ambulatory Visit: Payer: Self-pay | Admitting: Family Medicine

## 2020-09-17 NOTE — Telephone Encounter (Signed)
Last OV 08/05/20 Last refill 08/26/20 #10/0 Next OV 11/06/20

## 2020-09-23 ENCOUNTER — Encounter: Payer: Self-pay | Admitting: Family Medicine

## 2020-09-23 NOTE — Progress Notes (Unsigned)
I, [name], LAT, ATC, am serving as scribe for Dr. [name].  Glenn [name] is a 40 y.o. male who presents to [facility] today for f/u right knee pain ongoing since 08/01/20 w/ a dx of gout. Pt was last seen by Dr. Georgina Snell on 08/05/20 and was given a steroid injection and advised to obtain labs. He was also advised to use Voltaren gel.  Today, pt reports that his R knee has been bothering him ever since his last appt but notes that it significantly worsened approximately 2 days ago w/ no known MOI.  His R knee is swollen but does not report any mechanical symptoms.  Aggravating factors include attempts at R knee flexion and walking/weight-bearing activity.  He is taking Tramadol as prescribed by Dr. Yong Channel w/ no relief.  Dx imaging: 08/05/20 R knee XR  Pertinent review of systems: No fevers or chills  Relevant historical information: Hypertension   Exam:  BP 114/82 (BP Location: Right Arm, Patient Position: Sitting, Cuff Size: Large)   Pulse 80   Ht 5\' 8"  (1.727 m)   Wt 226 lb 3.2 oz (102.6 kg)   SpO2 97%   BMI 34.39 kg/m  General: Well Developed, well nourished, in significant pain appearing MSK: Right knee mild effusion no erythema otherwise normal-appearing Decreased motion. Significantly limited flexion by pain. Diffusely tender especially in anterior knee. Significant guarding unable to proceed with ligament exam testing or McMurray's testing. Strength testing intact to extension and flexion however very limited due to guarding and pain.    Lab and Radiology Results  Procedure: Real-time Ultrasound Guided Injection of right knee superior lateral patellar space Device: Philips Affiniti 50G Images permanently stored and available for review in PACS Ultrasound examination prior to injection reveals intact quad tendon patellar tendon medial and lateral joint line structures. Moderate joint effusion and small Baker's cyst present. Verbal  informed consent obtained.  Discussed risks and benefits of procedure. Warned about infection bleeding damage to structures skin hypopigmentation and fat atrophy among others. Patient expresses understanding and agreement Time-out conducted.   Noted no overlying erythema, induration, or other signs of local infection.   Skin prepped in a sterile fashion.   Local anesthesia: Topical Ethyl chloride.   With sterile technique and under real time ultrasound guidance:  40 mg of Kenalog and 2 mL of Marcaine injected into joint. Fluid seen entering the capsule.   Completed without difficulty   Pain moderately resolved suggesting accurate placement of the medication.   Advised to call if fevers/chills, erythema, induration, drainage, or persistent bleeding.   Images permanently stored and available for review in the ultrasound unit.  Impression: Technically successful ultrasound guided injection.      Assessment and Plan: 40 y.o. male with severe right knee pain. Patient has been having some chronic knee pain over the last several months but worsened dramatically a few days ago. Fundamental cause is unclear at this time. Given the severity of his pain and inability to work at this time we'll proceed with further advanced imaging studies to characterize cause. We'll proceed with MRI. For pain management will prescribe oxycodone as would like to avoid Tylenol given history of acute hepatitis in the past recently. Steroid injection as above today should be somewhat helpful. Recheck following MRI. Work note written.   PDMP reviewed during this encounter. Orders Placed This Encounter  Procedures  . [place] LIMITED JOINT SPACE STRUCTURES LOW RIGHT(NO LINKED CHARGES)    Order Specific  Question:   Reason for Exam (SYMPTOM  OR DIAGNOSIS REQUIRED)    Answer:   R knee pain    Order Specific Question:   Preferred imaging location?    Answer:   [place]  . MR Knee Right Wo Contrast     Standing Status:   Future    Standing Expiration Date:   09/24/2021    Order Specific Question:   What is the patient's sedation requirement?    Answer:   No Sedation    Order Specific Question:   Does the patient have a pacemaker or implanted devices?    Answer:   No    Order Specific Question:   Preferred imaging location?    Answer:   [facility] development [facility] (table limit-350lbs)   Meds ordered this encounter  Medications  . oxyCODONE (ROXICODONE) 5 MG immediate release tablet    Sig: Take 1 tablet (5 mg total) by mouth every 6 (six) hours as needed for severe pain.    Dispense:  15 tablet    Refill:  0     Discussed warning signs or symptoms. Please see discharge instructions. Patient expresses understanding.   The above documentation has been reviewed and is accurate and complete [name], M.D.

## 2020-09-24 ENCOUNTER — Encounter: Payer: Self-pay | Admitting: Family Medicine

## 2020-09-24 ENCOUNTER — Ambulatory Visit: Payer: 59 | Admitting: Family Medicine

## 2020-09-24 ENCOUNTER — Ambulatory Visit: Payer: Self-pay

## 2020-09-24 ENCOUNTER — Other Ambulatory Visit: Payer: Self-pay

## 2020-09-24 VITALS — BP 114/82 | HR 80 | Ht 68.0 in | Wt 226.2 lb

## 2020-09-24 DIAGNOSIS — M25561 Pain in right knee: Secondary | ICD-10-CM

## 2020-09-24 MED ORDER — OXYCODONE HCL 5 MG PO TABS: 5.0000 mg | ORAL_TABLET | Freq: Four times a day (QID) | ORAL | 0 refills | Status: DC | PRN

## 2020-09-24 NOTE — Patient Instructions (Addendum)
Thank you for coming in today.  Call or go to the ER if you develop a large red swollen joint with extreme pain or oozing puss.   You should hear from MRI scheduling within 1 week. If you do not hear please let me know.

## 2020-09-27 ENCOUNTER — Ambulatory Visit (INDEPENDENT_AMBULATORY_CARE_PROVIDER_SITE_OTHER): Payer: 59

## 2020-09-27 ENCOUNTER — Other Ambulatory Visit: Payer: Self-pay

## 2020-09-27 DIAGNOSIS — M7121 Synovial cyst of popliteal space [Baker], right knee: Secondary | ICD-10-CM | POA: Diagnosis not present

## 2020-09-27 DIAGNOSIS — M25561 Pain in right knee: Secondary | ICD-10-CM | POA: Diagnosis not present

## 2020-09-28 ENCOUNTER — Encounter: Payer: Self-pay | Admitting: Family Medicine

## 2020-09-28 NOTE — Progress Notes (Signed)
MRI knee shows a baker cyst and some arthritis under the knee cap. This does not explain the severity of the pain you have been having in the knee.  How have you been feeling this this weekend?

## 2020-09-29 ENCOUNTER — Telehealth: Payer: Self-pay | Admitting: Family Medicine

## 2020-09-29 ENCOUNTER — Encounter: Payer: Self-pay | Admitting: Family Medicine

## 2020-09-29 MED ORDER — OXYCODONE HCL 5 MG PO TABS: 5.0000 mg | ORAL_TABLET | Freq: Four times a day (QID) | ORAL | 0 refills | Status: DC | PRN

## 2020-09-29 NOTE — Telephone Encounter (Signed)
Patient called stating that he was trying to send a message through [place] but he was having a hard time with it.  He asked for a refill on his Oxycodone 5mg  if possible. He said that he has only been using them as needed and not taking them everyday but he is almost out. He has tried the Voltaren Gel but it does not seem to help at all.  He is also needing a note for work allowing him to go back to work on full duty on Monday, 10/05/2020.  And he asked if the place on the back of his knee will go away on its own or if he would need to have it removed?   Please advise.

## 2020-09-29 NOTE — Telephone Encounter (Signed)
Called pt and relayed Dr. Clovis Riley advice.  Pt verbalizes understanding and does not have any additional questions/concerns.

## 2020-09-29 NOTE — Telephone Encounter (Signed)
Oxycodone refilled however we cannot use this medicine long-term.   Work note written.  A copy was sent via MyChart and a copy is printed and available at the front desk if you need a hard copy.   The Baker's cyst on the back of the knee may go away on its own if it does not and you continue to have pain and we cannot get it to go away with injections surgery will be indicated.

## 2020-09-30 ENCOUNTER — Other Ambulatory Visit: Payer: Self-pay

## 2020-09-30 ENCOUNTER — Telehealth: Payer: Self-pay

## 2020-09-30 DIAGNOSIS — K863 Pseudocyst of pancreas: Secondary | ICD-10-CM

## 2020-09-30 NOTE — Telephone Encounter (Signed)
I have reviewed the chart.   I agree with the Advanced Practitioner's note, impression, and recommendations with any updates as below. We will see how repeat LFTs are looking.  Based on the patient's prior imaging study I would recommend we proceed with an EGD/EUS for follow-up of healing and to evaluate the pancreatic cystic lesion.  We can decide on MRI/MRCP follow-up thereafter.   Justice Britain, MD [facility] Advanced Endoscopy Office # [phone]

## 2020-09-30 NOTE — Telephone Encounter (Signed)
-----   Message from Timothy Lasso, RN sent at 08/18/2020  3:18 PM EST -----  ----- Message ----- From: Irving Copas., MD Sent: 08/18/2020   2:40 PM EST To: Timothy Lasso, RN, [place], [place]    ----- Message ----- From: [place] Erp, [place] Sent: 08/18/2020   9:48 AM EST To: Irving Copas., MD

## 2020-09-30 NOTE — Telephone Encounter (Signed)
EGD EUS scheduled for 11/05/20 at 1030 am at [facility] with Dr Rush Landmark COVID test on

## 2020-09-30 NOTE — Telephone Encounter (Signed)
EUS scheduled, pt instructed and medications reviewed.  Patient instructions mailed to home and the pt has been advised via My Chart.  Patient to call with any questions or concerns.

## 2020-10-01 ENCOUNTER — Telehealth: Payer: Self-pay | Admitting: Psychiatry

## 2020-10-01 NOTE — Telephone Encounter (Signed)
This message is for Glenn [name] his appt with her is 2/14. He is a [facility] pt. The pharmacy that faxed his medication history it is In your box.

## 2020-10-01 NOTE — Telephone Encounter (Signed)
Pt called and said that he needs a refill on his ritalin 10 mg to be sent to the [facility] on [address]. He has an appt on 10/12/2020

## 2020-10-02 NOTE — Telephone Encounter (Signed)
I have it, in blue folder in my office.

## 2020-10-03 ENCOUNTER — Other Ambulatory Visit: Payer: Self-pay | Admitting: Family Medicine

## 2020-10-12 ENCOUNTER — Encounter: Payer: Self-pay | Admitting: Family Medicine

## 2020-10-12 ENCOUNTER — Other Ambulatory Visit: Payer: Self-pay

## 2020-10-12 ENCOUNTER — Other Ambulatory Visit: Payer: Self-pay | Admitting: Family Medicine

## 2020-10-12 ENCOUNTER — Ambulatory Visit (INDEPENDENT_AMBULATORY_CARE_PROVIDER_SITE_OTHER): Payer: 59 | Admitting: Physician Assistant

## 2020-10-12 ENCOUNTER — Encounter: Payer: Self-pay | Admitting: Physician Assistant

## 2020-10-12 DIAGNOSIS — F900 Attention-deficit hyperactivity disorder, predominantly inattentive type: Secondary | ICD-10-CM | POA: Diagnosis not present

## 2020-10-12 DIAGNOSIS — F1021 Alcohol dependence, in remission: Secondary | ICD-10-CM

## 2020-10-12 DIAGNOSIS — F410 Panic disorder [episodic paroxysmal anxiety] without agoraphobia: Secondary | ICD-10-CM | POA: Diagnosis not present

## 2020-10-12 DIAGNOSIS — F411 Generalized anxiety disorder: Secondary | ICD-10-CM | POA: Diagnosis not present

## 2020-10-12 DIAGNOSIS — F17200 Nicotine dependence, unspecified, uncomplicated: Secondary | ICD-10-CM

## 2020-10-12 DIAGNOSIS — F0631 Mood disorder due to known physiological condition with depressive features: Secondary | ICD-10-CM | POA: Diagnosis not present

## 2020-10-12 MED ORDER — METHYLPHENIDATE HCL 10 MG PO TABS: 10.0000 mg | ORAL_TABLET | Freq: Three times a day (TID) | ORAL | 0 refills | Status: DC

## 2020-10-12 MED ORDER — TRAZODONE HCL 100 MG PO TABS: ORAL_TABLET | ORAL | 5 refills | Status: DC

## 2020-10-12 MED ORDER — DIAZEPAM 10 MG PO TABS: 10.0000 mg | ORAL_TABLET | Freq: Two times a day (BID) | ORAL | 5 refills | Status: DC | PRN

## 2020-10-12 MED ORDER — FLUVOXAMINE MALEATE 100 MG PO TABS: ORAL_TABLET | ORAL | 5 refills | Status: DC

## 2020-10-12 NOTE — Progress Notes (Signed)
Phone [phone] In person visit   Subjective:   Glenn [name] is a 40 y.o. year old very pleasant male patient who presents for/with See problem oriented charting Chief Complaint  Patient presents with  . Sore Throat  . Headache  . Nasal Congestion    Runny nose and when he blew his nose yesterday his snot was dark brown    This visit occurred during the SARS-CoV-2 public health emergency.  Safety protocols were in place, including screening questions prior to the visit, additional usage of staff PPE, and extensive cleaning of exam room while observing appropriate contact time as indicated for disinfecting solutions.   Past Medical History-  Patient Active Problem List   Diagnosis Date Noted  . Pancreatic pseudocyst 08/05/2020    Priority: High  . Acute hepatitis 07/27/2020    Priority: High  . Hepatic steatosis 02/11/2018    Priority: High  . Alcohol use disorder, moderate, in controlled environment ([place]) 02/11/2018    Priority: High  . Rosacea 10/13/2020    Priority: Medium  . Chronic fatigue 10/28/2018    Priority: Medium  . GAD (generalized anxiety disorder) 07/02/2018    Priority: Medium  . Major depression in full remission ([place]) 07/02/2018    Priority: Medium  . Tobacco abuse 02/24/2015    Priority: Medium  . Essential hypertension, benign 02/18/2014    Priority: Medium  . Panic disorder 12/10/2013    Priority: Medium  . Hyperglycemia 02/11/2018    Priority: Low  . Pancreatitis, alcoholic, acute 28/41/3244    Priority: Low  . Elevated ferritin 08/05/2020  . Abdominal pain 07/27/2020  . Mood disorder with depressive features due to general medical condition 10/28/2018  . Acute pain of right knee 05/14/2018  . Elevated uric acid in blood 03/16/2018    Medications- reviewed and updated Current Outpatient Medications  Medication Sig Dispense Refill  . amLODipine (NORVASC) 5 MG tablet Take 1 tablet (5 mg total) by mouth daily. 30 tablet 0  .  amoxicillin-clavulanate (AUGMENTIN) 875-125 MG tablet Take 1 tablet by mouth 2 (two) times daily for 7 days. 14 tablet 0  . diazepam (VALIUM) 10 MG tablet Take 1 tablet (10 mg total) by mouth 2 (two) times daily as needed for anxiety (panic). 60 tablet 5  . fluvoxaMINE (LUVOX) 100 MG tablet TAKE 1 TABLET(100 MG) BY MOUTH AT BEDTIME 30 tablet 5  . FOLIC ACID PO Take by mouth.    . losartan (COZAAR) 100 MG tablet Take 100 mg by mouth daily.    Derrill Memo ON 11/08/2020] methylphenidate (RITALIN) 10 MG tablet Take 1 tablet (10 mg total) by mouth 3 (three) times daily with meals. 90 tablet 0  . methylphenidate (RITALIN) 10 MG tablet Take 1 tablet (10 mg total) by mouth 3 (three) times daily with meals. 90 tablet 0  . metroNIDAZOLE (METROGEL) 1 % gel Apply topically daily. For rosacea 60 g 2  . OVER THE COUNTER MEDICATION Take 6 capsules by mouth See admin instructions. Pt takes fruit and vegetable supplement. 3 fruit capsules and 3 vegetable capsules once daily    . OVER THE COUNTER MEDICATION Take 1 Scoop by mouth daily. Super beets supplement powder.    . pantoprazole (PROTONIX) 40 MG tablet Take 1 tablet (40 mg total) by mouth daily. 30 tablet 4  . traZODone (DESYREL) 100 MG tablet TAKE 1 TABLET(100 MG) BY MOUTH AT BEDTIME AS NEEDED FOR SLEEP 30 tablet 5  . [START ON 12/08/2020] methylphenidate (RITALIN) 10 MG tablet Take  1 tablet (10 mg total) by mouth 3 (three) times daily with meals. 90 tablet 0  . sucralfate (CARAFATE) 1 GM/10ML suspension Take 10 mLs (1 g total) by mouth 2 (two) times daily. 600 mL 0  . traMADol (ULTRAM) 50 MG tablet TAKE 1 TABLET(50 MG) BY MOUTH EVERY 6 HOURS AS NEEDED 20 tablet 1   No current facility-administered medications for this visit.     Objective:  BP 114/80 Comment: some white coat hypertension- home reading from 3 days ago.  Pulse 88   Temp 98.9 F (37.2 C) (Temporal)   Ht 5\' 8"  (1.727 m)   Wt 227 lb (103 kg)   SpO2 98%   BMI 34.52 kg/m  Gen: NAD, resting  comfortably Bilateral frontal sinus tenderness noted.  No maxillary sinus tenderness.  Nasal turbinates erythematous with some yellowish discharge.  Tympanic membranes normal. CV: RRR no murmurs rubs or gallops Lungs: CTAB no crackles, wheeze, rhonchi Ext: no edema Skin: warm, dry    Assessment and Plan  Sinus issues S:patient with issues starting on the 7th- had fatigue and then symptoms later progressed to significant sore throat. Also body aches,  headache, nasal congestion, sweats. Was afebrile even with sweats.  Has had rhinorrhea typically clear in color. Yesterday when blew nose was dark brown. A lot of sinus pressure around eyes. Symptoms slightly better today, significant worsening of symptoms on Friday though (double sickening). Day 8 today if counting day 0 as first day of symptoms.   Has had 4 negative rapid tests with first negative last Monday and last negative this Monday.  A/P: day 8 of symptoms per cdc quarantine guidelines if this is covid. Since improving as of last 24 hours and no respiratory symptoms or fever does not have to self isolate as long as wearing mask- he opted in discussion not to be tested for covid 19 . Day 9 of symptoms per how I typically calculate for sinusitis- and a lot of sinus symptoms- we opted to give him augmentin to have on hand if symptoms worsen or fail to improve in next 48 hours- possible bacterial sinusitis based on duration.   - He will let us know if new or worsening symptoms or failure to improve -coricidin HBP causes anxiety and vertigo so not many options he can take  #hypertension S: medication: amlodipine 5 mg, losartan 100mg   BP Readings from Last 3 Encounters:  10/13/20 114/80  09/24/20 114/82  08/18/20 138/72  A/P: poor control on initial check but has had good home #s- these were entered- continue current meds   #Severe right knee pain-MRI planned from January 27 visit with Dr. Corey-ultimately had this done on January 30 there  was a Baker's cyst and some arthritis under the kneecap out of proportion to expected with his level of pain.  Was given oxycodone for pain initially and also given steroid injection.  He ran out of the oxycodone and I have been refilling tramadol-preferred to avoid Tylenol with hepatitis issues in the past - he didn't tolerate the oxycodone- made him feel jittery -tramadol does not make him jittery and prefers this- though doesn't help quite as much with pain  #rosacea- wants to get derm opinion- has had for years- red nose and cheeks with dry skin- cheeks and forehead more with masking- prior to that mainly nose. Try rosacea until appointment  Recommended follow up:  Keep physical in march Future Appointments  Date Time Provider [place]  10/16/2020  8:30 AM Georgina Snell,  Rebekah Chesterfield, MD [facility] None  11/02/2020 10:05 AM [facility]ENING [facility] None  11/06/2020 10:00 AM Marin Olp, MD [facility] P[facility]  01/14/2021  2:00 PM Donnal Moat T, PA-C [facility] None  01/22/2021  9:00 AM [facility] 1 [facility] [place]    Lab/Order associations:   ICD-10-CM   1. Rosacea  L71.9 Ambulatory referral to Dermatology  2. Acute frontal sinusitis, recurrence not specified  J01.10   3. Essential hypertension, benign  I10     Meds ordered this encounter  Medications  . traMADol (ULTRAM) 50 MG tablet    Sig: TAKE 1 TABLET(50 MG) BY MOUTH EVERY 6 HOURS AS NEEDED    Dispense:  20 tablet    Refill:  1  . amoxicillin-clavulanate (AUGMENTIN) 875-125 MG tablet    Sig: Take 1 tablet by mouth 2 (two) times daily for 7 days.    Dispense:  14 tablet    Refill:  0  . metroNIDAZOLE (METROGEL) 1 % gel    Sig: Apply topically daily. For rosacea    Dispense:  60 g    Refill:  2    Return precautions advised.  [name], MD

## 2020-10-12 NOTE — Telephone Encounter (Signed)
Lets discuss at visit tomorrow to look at frequency needed

## 2020-10-12 NOTE — Telephone Encounter (Signed)
..   LAST APPOINTMENT DATE: 10/12/2020   NEXT APPOINTMENT DATE:@2 /15/2022  MEDICATION:traMADol (ULTRAM) 50 MG tablet    PHARMACY:[id number] - [place], [place] - [address] AT [place]

## 2020-10-12 NOTE — Patient Instructions (Addendum)
Health Maintenance Due  Topic Date Due  . COVID-19 Vaccine (3 - Booster for Moderna series)- consider booster 06/17/2020    day 8 of symptoms per cdc quarantine guidelines if this is covid. Since improving as of last 24 hours and no respiratory symptoms or fever does not have to self isolate as long as wearing mask. Day 9 of symptoms per how I typically calculate- and a lot of sinus symptoms- we opted to give him augmentin to have on hand if symptoms worsen or fail to improve in next 48 hours- possible bacterial sinusitis based on duration.   He will let us know if new or worsening symptoms or failure to improve  Try metrogel for rosacea We will call you within two weeks about your referral to dmeratology. If you do not hear within 2 weeks, give [place] a call.    Recommended follow up: He will let us know if new or worsening symptoms or failure to improve

## 2020-10-12 NOTE — Progress Notes (Signed)
[facility] Med Check  Patient ID: Glenn [name],  MRN: [id number]  PCP: Glenn Olp, MD  Date of Evaluation: 10/12/2020 Time spent:45 minutes  Chief Complaint:  Chief Complaint    ADD; Anxiety; Depression      HISTORY/CURRENT STATUS: HPI For routine med check.  Transferring to my care from Dr. Milana [name] who retired recently.  Glenn [name] is here to establish care with me and get refills on his medications.  He feels that meds at the current doses are working well.  The recurrent depression is stable.  He is able to enjoy things, energy and motivation are good as long as he has the Ritalin, he is not isolating.  Does not cry easily.  Not missing work due to mental health.  Sleeps well with the trazodone.  No suicidal or homicidal thoughts.  Of importance is a recent hospitalization, the week after Thanksgiving.  He had severe abdominal pain with abnormal LFTs and pancreatic pseudocyst on CT scan.  He feels much better but states they are still trying to figure out what is wrong.  He has an endoscopy set up in March.  He also has a Baker's cyst, seeing Ortho for that.  He is in quite a bit of pain at times.  Is on tramadol which does help.  Interestingly it also helps with depression.  Anxiety is well controlled.  He does take the Valium fairly regularly.  States he does not use it at the same time he takes the Ritalin.  Continues to use tobacco. Not ready to quit right now. Has had no ETOH in months except for 2 beers at Thanksgiving.   States that attention is good without easy distractibility.  Able to focus on things and finish tasks to completion.   Patient denies increased energy with decreased need for sleep, no increased talkativeness, no racing thoughts, no impulsivity or risky behaviors, no increased spending, no increased libido, no grandiosity, no increased irritability or anger, and no hallucinations.  Denies dizziness, syncope, seizures, numbness, tingling,  tremor, tics, unsteady gait, slurred speech, confusion.   Individual Medical History/ Review of Systems: Changes? :Yes  See HPI  Past medications for mental health diagnoses include: Effexor caused tremor, Nortriptylline  Allergies: Morphine and related  Current Medications:  Current Outpatient Medications:  .  [START ON 12/08/2020] methylphenidate (RITALIN) 10 MG tablet, Take 1 tablet (10 mg total) by mouth 3 (three) times daily with meals., Disp: 90 tablet, Rfl: 0 .  [START ON 11/08/2020] methylphenidate (RITALIN) 10 MG tablet, Take 1 tablet (10 mg total) by mouth 3 (three) times daily with meals., Disp: 90 tablet, Rfl: 0 .  methylphenidate (RITALIN) 10 MG tablet, Take 1 tablet (10 mg total) by mouth 3 (three) times daily with meals., Disp: 90 tablet, Rfl: 0 .  OVER THE COUNTER MEDICATION, Take 6 capsules by mouth See admin instructions. Pt takes fruit and vegetable supplement. 3 fruit capsules and 3 vegetable capsules once daily, Disp: , Rfl:  .  OVER THE COUNTER MEDICATION, Take 1 Scoop by mouth daily. Super beets supplement powder., Disp: , Rfl:  .  traMADol (ULTRAM) 50 MG tablet, TAKE 1 TABLET(50 MG) BY MOUTH EVERY 8 HOURS AS NEEDED, Disp: 10 tablet, Rfl: 1 .  amLODipine (NORVASC) 5 MG tablet, Take 1 tablet (5 mg total) by mouth daily., Disp: 30 tablet, Rfl: 0 .  diazepam (VALIUM) 10 MG tablet, Take 1 tablet (10 mg total) by mouth 2 (two) times daily as needed for anxiety (panic)., Disp:  60 tablet, Rfl: 5 .  fluvoxaMINE (LUVOX) 100 MG tablet, TAKE 1 TABLET(100 MG) BY MOUTH AT BEDTIME, Disp: 30 tablet, Rfl: 5 .  oxyCODONE (ROXICODONE) 5 MG immediate release tablet, Take 1 tablet (5 mg total) by mouth every 6 (six) hours as needed for severe pain. (Patient not taking: Reported on 10/12/2020), Disp: 15 tablet, Rfl: 0 .  pantoprazole (PROTONIX) 40 MG tablet, Take 1 tablet (40 mg total) by mouth daily., Disp: 30 tablet, Rfl: 4 .  sucralfate (CARAFATE) 1 GM/10ML suspension, Take 10 mLs (1 g total)  by mouth 2 (two) times daily., Disp: 600 mL, Rfl: 0 .  traZODone (DESYREL) 100 MG tablet, TAKE 1 TABLET(100 MG) BY MOUTH AT BEDTIME AS NEEDED FOR SLEEP, Disp: 30 tablet, Rfl: 5 Medication Side Effects: none  Family Medical/ Social History: Changes? No  MENTAL HEALTH EXAM:  There were no vitals taken for this visit.There is no height or weight on file to calculate BMI.  General Appearance: Casual, Neat and Well Groomed  Eye Contact:  Good  Speech:  Clear and Coherent and Normal Rate  Volume:  Normal  Mood:  Euthymic  Affect:  Appropriate  Thought Process:  Goal Directed and Descriptions of Associations: Intact  Orientation:  Full (Time, Place, and Person)  Thought Content: Logical   Suicidal Thoughts:  No  Homicidal Thoughts:  No  Memory:  WNL  Judgement:  Good  Insight:  Good  Psychomotor Activity:  Normal  Concentration:  Concentration: Good and Attention Span: Good  Recall:  Good  Fund of Knowledge: Good  Language: Good  Assets:  Desire for Improvement  ADL's:  Intact  Cognition: WNL  Prognosis:  Good     DIAGNOSES:    ICD-10-CM   1. Mood disorder with depressive features due to general medical condition  F06.31 fluvoxaMINE (LUVOX) 100 MG tablet  2. Panic disorder  F41.0 diazepam (VALIUM) 10 MG tablet    fluvoxaMINE (LUVOX) 100 MG tablet    traZODone (DESYREL) 100 MG tablet  3. GAD (generalized anxiety disorder)  F41.1 diazepam (VALIUM) 10 MG tablet    fluvoxaMINE (LUVOX) 100 MG tablet    traZODone (DESYREL) 100 MG tablet  4. Attention deficit hyperactivity disorder (ADHD), predominantly inattentive type  F90.0   5. Alcohol use disorder, moderate, in early remission ([place])  F10.21   6. Tobacco use disorder, moderate, dependence  F17.200     Receiving Psychotherapy: No    RECOMMENDATIONS:  PDMP was reviewed. I provided 45 minutes of face to face time during this encounter, including time spent before and after the appointment, reviewing labs, hospital notes,  radiology results and orthopedist notes.  All are noted in epic. He is doing well as far as his mental health goes so I see no need to make changes in the current medications. ( I did not discuss this with him but it is not my norm to prescribe benzodiazepine and a stimulant at the same time.  However, he has done well on this regimen given by Dr. Creig Hines who knew him well, therefore I will make no changes.) Briefly discussed cessation of tobacco.  He will let me know if I can help him stop in any way.   Continue Valium 10 mg, 1 p.o. twice daily as needed. Continue Luvox 100 mg, 1 p.o. nightly. Continue Ritalin 10 mg, 1 p.o. 3 times daily. Continue trazodone 100 mg, 1 p.o. nightly as needed. Return in 3 months  Donnal Moat, [place]

## 2020-10-13 ENCOUNTER — Ambulatory Visit: Payer: 59 | Admitting: Family Medicine

## 2020-10-13 ENCOUNTER — Other Ambulatory Visit: Payer: Self-pay

## 2020-10-13 ENCOUNTER — Encounter: Payer: Self-pay | Admitting: Family Medicine

## 2020-10-13 VITALS — BP 114/80 | HR 88 | Temp 98.9°F | Ht 68.0 in | Wt 227.0 lb

## 2020-10-13 DIAGNOSIS — J0110 Acute frontal sinusitis, unspecified: Secondary | ICD-10-CM | POA: Diagnosis not present

## 2020-10-13 DIAGNOSIS — I10 Essential (primary) hypertension: Secondary | ICD-10-CM

## 2020-10-13 DIAGNOSIS — L719 Rosacea, unspecified: Secondary | ICD-10-CM | POA: Diagnosis not present

## 2020-10-13 MED ORDER — AMOXICILLIN-POT CLAVULANATE 875-125 MG PO TABS: 1.0000 | ORAL_TABLET | Freq: Two times a day (BID) | ORAL | 0 refills | Status: AC

## 2020-10-13 MED ORDER — TRAMADOL HCL 50 MG PO TABS: ORAL_TABLET | ORAL | 1 refills | Status: DC

## 2020-10-13 MED ORDER — METRONIDAZOLE 1 % EX GEL: Freq: Every day | CUTANEOUS | 2 refills | Status: DC

## 2020-10-15 NOTE — Progress Notes (Deleted)
   I, [name], LAT, ATC, am serving as scribe for Dr. [name].  Glenn [name] is a 40 y.o. male who presents to [place] at [facility] today for f/u of chronic R knee pain and R knee MRI review.  He was last seen by Dr. Georgina Snell on 09/24/20 w/ worsening R knee pain and swelling and had a R knee injection.  He was also prescribed a limited quantity of oxycodone for pain.  Since his last visit, pt reports   Diagnostic imaging: R knee MRI- 09/27/20; R knee XR- 08/05/20  Pertinent review of systems: ***  Relevant historical information: ***   Exam:  There were no vitals taken for this visit. General: Well Developed, well nourished, and in no acute distress.   MSK: ***    Lab and Radiology Results No results found for this or any previous visit (from the past 72 hour(s)). No results found.     Assessment and Plan: 40 y.o. male with ***   PDMP not reviewed this encounter. No orders of the defined types were placed in this encounter.  No orders of the defined types were placed in this encounter.    Discussed warning signs or symptoms. Please see discharge instructions. Patient expresses understanding.   ***

## 2020-10-16 ENCOUNTER — Ambulatory Visit: Payer: 59 | Admitting: Family Medicine

## 2020-10-24 ENCOUNTER — Other Ambulatory Visit: Payer: Self-pay | Admitting: Family Medicine

## 2020-10-26 ENCOUNTER — Other Ambulatory Visit: Payer: Self-pay

## 2020-10-26 ENCOUNTER — Telehealth: Payer: Self-pay

## 2020-10-26 ENCOUNTER — Ambulatory Visit: Payer: Self-pay

## 2020-10-26 ENCOUNTER — Encounter: Payer: Self-pay | Admitting: Family Medicine

## 2020-10-26 ENCOUNTER — Ambulatory Visit: Payer: 59 | Admitting: Family Medicine

## 2020-10-26 VITALS — BP 122/86 | HR 97 | Ht 68.0 in | Wt 215.8 lb

## 2020-10-26 DIAGNOSIS — M25561 Pain in right knee: Secondary | ICD-10-CM | POA: Diagnosis not present

## 2020-10-26 DIAGNOSIS — G8929 Other chronic pain: Secondary | ICD-10-CM

## 2020-10-26 DIAGNOSIS — M7121 Synovial cyst of popliteal space [Baker], right knee: Secondary | ICD-10-CM | POA: Diagnosis not present

## 2020-10-26 DIAGNOSIS — M222X1 Patellofemoral disorders, right knee: Secondary | ICD-10-CM | POA: Diagnosis not present

## 2020-10-26 MED ORDER — LOSARTAN POTASSIUM 50 MG PO TABS: 50.0000 mg | ORAL_TABLET | Freq: Two times a day (BID) | ORAL | 3 refills | Status: DC

## 2020-10-26 MED ORDER — TRAMADOL HCL 50 MG PO TABS: ORAL_TABLET | ORAL | 1 refills | Status: DC

## 2020-10-26 NOTE — Telephone Encounter (Signed)
Losrtan 50Mg  bid sent to pharmacy, Tramadol was filled 10/13/20.

## 2020-10-26 NOTE — Patient Instructions (Addendum)
Thank you for coming in today.  Call or go to the ER if you develop a large red swollen joint with extreme pain or oozing puss.   You have been referred to PT.  Please let us know if you haven't heard from them in one week regarding scheduling.   Baker Cyst  A Baker cyst, also called a popliteal cyst, is a growth that forms at the back of the knee. The cyst forms when the fluid-filled sac (bursa) that cushions the knee joint becomes enlarged. What are the causes? In most cases, a Baker cyst results from another knee problem that causes swelling inside the knee. This makes the fluid inside the knee joint (synovial fluid) flow into the bursa behind the knee, causing the bursa to enlarge. What increases the risk? You may be more likely to develop a Baker cyst if you already have a knee problem, such as:  A tear in cartilage that cushions the knee joint (meniscal tear).  A tear in the tissues that connect the bones of the knee joint (ligament tear).  Knee swelling from osteoarthritis, rheumatoid arthritis, or gout. What are the signs or symptoms? The main symptom of this condition is a lump behind the knee. This may be the only symptom of the condition. The lump may be painful, especially when the knee is straightened. If the lump is painful, the pain may come and go. The knee may also be stiff. Symptoms may quickly get more severe if the cyst breaks open (ruptures). If the cyst ruptures, you may feel the following in your knee and calf:  Sudden or worsening pain.  Swelling.  Bruising.  Redness in the calf. A Baker cyst does not always cause symptoms. How is this diagnosed? This condition may be diagnosed based on your symptoms and medical history. Your health care provider will also do a physical exam. This may include:  Feeling the cyst to check whether it is tender.  Checking your knee for signs of another knee condition that causes swelling. You may have imaging tests, such  as:  X-rays.  MRI.  Ultrasound. How is this treated? A Baker cyst that is not painful may go away without treatment. If the cyst gets large or painful, it will likely get better if the underlying knee problem is treated. If needed, treatment for a Baker cyst may include:  Resting.  Keeping weight off of the knee. This means not leaning on the knee to support your body weight.  Taking NSAIDs, such as ibuprofen, to reduce pain and swelling.  Having a procedure to drain the fluid from the cyst with a needle (aspiration). You may also get an injection of a medicine that reduces swelling (steroid).  Having surgery. This may be needed if other treatments do not work. This usually involves correcting knee damage and removing the cyst. Follow these instructions at home: Activity  Rest as told by your health care provider.  Avoid activities that make pain or swelling worse.  Return to your normal activities as told by your health care provider. Ask your health care provider what activities are safe for you.  Do not use the injured limb to support your body weight until your health care provider says that you can. Use crutches as told by your health care provider. General instructions  Take over-the-counter and prescription medicines only as told by your health care provider.  Keep all follow-up visits as told by your health care provider. This is important.  Contact a health care provider if:  You have knee pain, stiffness, or swelling that does not get better. Get help right away if:  You have sudden or worsening pain and swelling in your calf area. Summary  A Baker cyst, also called a popliteal cyst, is a growth that forms at the back of the knee.  In most cases, a Baker cyst results from another knee problem that causes swelling inside the knee.  A Baker cyst that is not painful may go away without treatment.  If needed, treatment for a Baker cyst may include resting,  keeping weight off of the knee, medicines, or draining fluid from the cyst.  Surgery may be needed if other treatments are not effective. This information is not intended to replace advice given to you by your health care provider. Make sure you discuss any questions you have with your health care provider. Document Revised: 12/28/2018 Document Reviewed: 12/28/2018 Elsevier Patient Education  Glenn [name].

## 2020-10-26 NOTE — Telephone Encounter (Signed)
.   LAST APPOINTMENT DATE: 10/24/2020   NEXT APPOINTMENT DATE:@3 /06/2021  MEDICATION:losartan (COZAAR) 100 MG tablet  ---- PATIENT STATES [facility] IS UNABLE TO GET THE 100 MG CAN WE WRITE A NEW RX FOR 50 MG AND HE TAKE TWO A DAY INSTEAD-----  traMADol (ULTRAM) 50 MG tablet  PHARMACY:[id number] - [place], [place] - [address] AT [place] [address]    LAST REFILL:  QTY:  REFILL DATE:    OTHER COMMENTS:    Okay for refill?  Please advise

## 2020-10-26 NOTE — Progress Notes (Signed)
Glenn [name] is a 40 y.o. male who presents to [place] at [facility] today for f/u of R knee pain and R knee MRI review.  He was last seen by Dr. Georgina Snell on 09/24/20 w/ worsening R knee pain and had a R knee injection.  He was also prescribed oxycodone to help w/ pain.  Since his last visit, pt reports that he con't to R ant knee pain and notes newer R post knee pain.  He states that he feels like his ant knee swelling has improved but notes a full sensation in his R post knee.  His last injection did help for approximately 4-5 days.  Diagnostic imaging: R knee MRI- 09/27/20; R knee XR- 08/05/20   Pertinent review of systems: No fevers or chills  Relevant historical information: Pretension   Exam:  BP 122/86 (BP Location: Left Arm, Patient Position: Sitting, Cuff Size: Large)   Pulse 97   Ht 5\' 8"  (1.727 m)   Wt 215 lb 12.8 oz (97.9 kg)   SpO2 98%   BMI 32.81 kg/m  General: Well Developed, well nourished, and in no acute distress.   MSK: Right knee normal-appearing Nontender palpation anterior knee and posterior medial knee. Normal motion with mild crepitation.    Lab and Radiology Results  EXAM: MRI OF THE RIGHT KNEE WITHOUT CONTRAST  TECHNIQUE: Multiplanar, multisequence MR imaging of the knee was performed. No intravenous contrast was administered.  COMPARISON:  08/05/2020  FINDINGS: MENISCI  Medial meniscus:  Unremarkable  Lateral meniscus:  Unremarkable  LIGAMENTS  Cruciates:  Unremarkable  Collaterals:  Unremarkable  CARTILAGE  Patellofemoral: Mild chondral heterogeneity centrally along the femoral trochlear groove suggesting mild chondromalacia on image 18 of series 7.  Medial:  Unremarkable  Lateral:  Unremarkable  Joint:  Unremarkable  Popliteal Fossa:  Small to moderate size Baker's cyst.  Extensor Mechanism:  Unremarkable  Bones: No significant extra-articular osseous  abnormalities identified.  Other: No supplemental non-categorized findings.  IMPRESSION: 1. Small to moderate size Baker's cyst. 2. Mild chondral heterogeneity centrally along the femoral trochlear groove suggesting mild chondromalacia.   Electronically Signed   By: Van Clines M.D.   On: 09/27/2020 11:47  I, [name], personally (independently) visualized and performed the interpretation of the images attached in this note.  Procedure: Real-time Ultrasound Guided aspiration and injection of right knee Baker's cyst Device: Philips Affiniti 50G Images permanently stored and available for review in PACS Verbal informed consent obtained.  Discussed risks and benefits of procedure. Warned about infection bleeding damage to structures skin hypopigmentation and fat atrophy among others. Patient expresses understanding and agreement Time-out conducted.   Noted no overlying erythema, induration, or other signs of local infection.   Skin prepped in a sterile fashion.   Local anesthesia: Topical Ethyl chloride.   With sterile technique and under real time ultrasound guidance:  5 mL of lidocaine injected subcutaneously and into Baker's cyst medial knee.  Skin was again sterilized and an 18-gauge needle was used to access the cyst. 10 mL of clear straw-colored fluid was aspirated. Syringe was exchanged and 2 mL of Marcaine and 40 mg of Kenalog was injected into the Baker's cyst.. Fluid seen entering the Baker's cyst.   Completed without difficulty   Pain moderately resolved suggesting accurate placement of the medication.   Advised to call if fevers/chills, erythema, induration, drainage, or persistent bleeding.   Images permanently stored and available for review in the ultrasound unit.  Impression: Technically  successful ultrasound guided injection.        Assessment and Plan: 40 y.o. male with right knee pain multifactorial.  Patient does have some pain due to Baker's  cyst.  Additionally he has patellofemoral pain syndrome.  Plan for Baker's cyst aspiration and injection as above today.  Additionally refer to physical therapy for patellofemoral pain syndrome treatment.  Limited tramadol refilled today.  Recheck back as needed.   PDMP reviewed during this encounter. Orders Placed This Encounter  Procedures  . [place] LIMITED JOINT SPACE STRUCTURES LOW RIGHT(NO LINKED CHARGES)    Order Specific Question:   Reason for Exam (SYMPTOM  OR DIAGNOSIS REQUIRED)    Answer:   R knee pain    Order Specific Question:   Preferred imaging location?    Answer:   [facility]  . Ambulatory referral to Physical Therapy    Referral Priority:   Routine    Referral Type:   Physical Medicine    Referral Reason:   Specialty Services Required    Requested Specialty:   Physical Therapy   Meds ordered this encounter  Medications  . traMADol (ULTRAM) 50 MG tablet    Sig: TAKE 1 TABLET(50 MG) BY MOUTH EVERY 6 HOURS AS NEEDED    Dispense:  20 tablet    Refill:  1     Discussed warning signs or symptoms. Please see discharge instructions. Patient expresses understanding.   The above documentation has been reviewed and is accurate and complete [name], M.D.  Total encounter time 20 minutes including face-to-face time with the patient and, reviewing past medical record, and charting on the date of service.   Reviewed MRI findings and treatment plan and options.  Time-based billing excludes time to perform knee aspiration and injection.

## 2020-10-27 DIAGNOSIS — M222X1 Patellofemoral disorders, right knee: Secondary | ICD-10-CM | POA: Insufficient documentation

## 2020-10-27 DIAGNOSIS — M7121 Synovial cyst of popliteal space [Baker], right knee: Secondary | ICD-10-CM | POA: Insufficient documentation

## 2020-10-30 ENCOUNTER — Encounter: Payer: Self-pay | Admitting: Physician Assistant

## 2020-10-30 ENCOUNTER — Telehealth (INDEPENDENT_AMBULATORY_CARE_PROVIDER_SITE_OTHER): Payer: 59 | Admitting: Physician Assistant

## 2020-10-30 ENCOUNTER — Encounter: Payer: Self-pay | Admitting: Family Medicine

## 2020-10-30 DIAGNOSIS — R059 Cough, unspecified: Secondary | ICD-10-CM | POA: Diagnosis not present

## 2020-10-30 DIAGNOSIS — R509 Fever, unspecified: Secondary | ICD-10-CM | POA: Diagnosis not present

## 2020-10-30 DIAGNOSIS — R5383 Other fatigue: Secondary | ICD-10-CM | POA: Diagnosis not present

## 2020-10-30 DIAGNOSIS — Z20822 Contact with and (suspected) exposure to covid-19: Secondary | ICD-10-CM

## 2020-10-30 DIAGNOSIS — R6883 Chills (without fever): Secondary | ICD-10-CM

## 2020-10-30 DIAGNOSIS — J069 Acute upper respiratory infection, unspecified: Secondary | ICD-10-CM

## 2020-10-30 DIAGNOSIS — R0981 Nasal congestion: Secondary | ICD-10-CM

## 2020-10-30 DIAGNOSIS — M791 Myalgia, unspecified site: Secondary | ICD-10-CM | POA: Diagnosis not present

## 2020-10-30 HISTORY — DX: Acute upper respiratory infection, unspecified: J06.9

## 2020-10-30 NOTE — Patient Instructions (Signed)
Please go to the nearest urgent care for COVID-19 and flu testing today.

## 2020-10-30 NOTE — Telephone Encounter (Signed)
Spoke with patient transferred to schedule appt

## 2020-10-30 NOTE — Progress Notes (Signed)
Virtual Visit via Video Note  I connected with Glenn [name] on 10/30/20 at 12:00 PM EST by a video enabled telemedicine application and verified that I am speaking with the correct person using two identifiers.  Location: Patient: home Provider: Therapist, music at [employer]  I discussed the limitations of evaluation and management by telemedicine and the availability of in person appointments. The patient expressed understanding and agreed to proceed.   Only the patient and myself were present for today's video call.   History of Present Illness: Chief complaint: Fever Tmax 103.5 F Symptom onset: 2-3 days ago Pertinent positives: Cough - productive yellow sputum, nasal congestion, ST, chills, body aches, slight SOB, slight HA Pertinent negatives: Chest pain, N/V/D Treatments tried: Nyquil last night; says he is not supposed to take ibuprofen or Tylenol due to area on his pancreas Vaccine status: Moderna two doses, no booster; Flu vaccine as well Sick exposure: None    Observations/Objective:   Gen: Awake, alert, no acute distress, VERY CONGESTED, malaise appearance Resp: Breathing is even and non-labored Psych: calm/pleasant demeanor Neuro: Alert and Oriented x 3, + facial symmetry, speech is clear.   Assessment and Plan: 1. Suspected COVID-19 virus infection Virtual visit with patient today.  He is malaised in appearance and sounds very congested.  He was treated on 10-13-20 with Augmentin by Dr. Yong Channel for presumed sinus infection at that point.  He states he was starting to feel little better and 2 days ago started running a high fever and has had worsening symptoms including the cough, chills, body aches, fatigue.  He says that he is going to have a Covid test done on Monday which is 3 days from now for a procedure he is scheduled for next week.  I urged him that he really needs to go to an urgent care for Covid testing and flu testing today as I suspect he has one  of these.  He is hesitant to go because he really does not feel well and does not want to drive.  I encouraged him that he is okay in appearance to drive and he should go to get tested in person.  This weekend he will do supportive care with nasal saline, humidifier, rest, pushing fluids.  He will go to ER if any sudden acute change in symptoms such as chest pain, shortness of breath, severe weakness.   Follow Up Instructions:    I discussed the assessment and treatment plan with the patient. The patient was provided an opportunity to ask questions and all were answered. The patient agreed with the plan and demonstrated an understanding of the instructions.   The patient was advised to call back or seek an in-person evaluation if the symptoms worsen or if the condition fails to improve as anticipated.  Rey Dansby M Alaura Schippers, PA-C

## 2020-11-02 ENCOUNTER — Other Ambulatory Visit (HOSPITAL_COMMUNITY)
Admission: RE | Admit: 2020-11-02 | Discharge: 2020-11-02 | Disposition: A | Discharge status: Home / Self Care | Payer: 59 | Source: Ambulatory Visit | Attending: Gastroenterology | Admitting: Gastroenterology

## 2020-11-02 DIAGNOSIS — Z20822 Contact with and (suspected) exposure to covid-19: Secondary | ICD-10-CM | POA: Insufficient documentation

## 2020-11-02 DIAGNOSIS — Z01812 Encounter for preprocedural laboratory examination: Secondary | ICD-10-CM | POA: Insufficient documentation

## 2020-11-02 LAB — SARS CORONAVIRUS 2 (TAT 6-24 HRS): SARS Coronavirus 2: NEGATIVE

## 2020-11-04 ENCOUNTER — Encounter (HOSPITAL_COMMUNITY): Payer: Self-pay | Admitting: Gastroenterology

## 2020-11-04 NOTE — Progress Notes (Signed)
Patient denies shortness of breath, fever, or chest pain.  PCP - Dr Rushie Chestnut Cardiologist - n/a  Chest x-ray - 07/17/20 (1V) EKG - 07/27/20 Stress Test - n/a ECHO - n/a Cardiac Cath - n/a  Anesthesia review: Yes  STOP now taking any Aspirin (unless otherwise instructed by your surgeon), Aleve, Naproxen, Ibuprofen, Motrin, Advil, Goody's, BC's, all herbal medications, fish oil, and all vitamins.   Coronavirus Screening Covid test on 11/02/20 was negative.  Patient verbalized understanding of instructions that were given via phone.
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
## 2020-11-04 NOTE — Anesthesia Preprocedure Evaluation (Addendum)
Anesthesia Evaluation  Patient identified by MRN, date of birth, ID band Patient awake    Reviewed: Allergy & Precautions, NPO status , Patient's Chart, lab work & pertinent test results  Airway Mallampati: II  TM Distance: >3 FB Neck ROM: Full    Dental no notable dental hx.    Pulmonary neg pulmonary ROS, former smoker,    Pulmonary exam normal breath sounds clear to auscultation       Cardiovascular hypertension, negative cardio ROS Normal cardiovascular exam Rhythm:Regular Rate:Normal     Neuro/Psych PSYCHIATRIC DISORDERS Anxiety Depression negative neurological ROS     GI/Hepatic GERD  ,(+)     substance abuse  alcohol use, Hepatitis -, Toxin RelatedPancreatic cyst    Endo/Other  negative endocrine ROS  Renal/GU negative Renal ROS  negative genitourinary   Musculoskeletal negative musculoskeletal ROS (+)   Abdominal Normal abdominal exam  (+)   Peds negative pediatric ROS (+)  Hematology negative hematology ROS (+)   Anesthesia Other Findings   Reproductive/Obstetrics negative OB ROS                            Anesthesia Physical Anesthesia Plan  ASA: II  Anesthesia Plan: MAC   Post-op Pain Management:    Induction: Intravenous  PONV Risk Score and Plan: 2 and Propofol infusion, TIVA and Treatment may vary due to age or medical condition  Airway Management Planned: Natural Airway, Nasal Cannula and Simple Face Mask  Additional Equipment: None  Intra-op Plan:   Post-operative Plan:   Informed Consent: I have reviewed the patients History and Physical, chart, labs and discussed the procedure including the risks, benefits and alternatives for the proposed anesthesia with the patient or authorized representative who has indicated his/her understanding and acceptance.       Plan Discussed with: CRNA and Anesthesiologist  Anesthesia Plan Comments: (PAT note written  11/04/2020 by Myra Gianotti, PA-C. )       Anesthesia Quick Evaluation

## 2020-11-05 ENCOUNTER — Ambulatory Visit (HOSPITAL_COMMUNITY)
Admission: RE | Admit: 2020-11-05 | Discharge: 2020-11-05 | Disposition: A | Discharge status: Home / Self Care | Payer: 59 | Attending: Gastroenterology | Admitting: Gastroenterology

## 2020-11-05 ENCOUNTER — Ambulatory Visit (HOSPITAL_COMMUNITY): Payer: 59 | Admitting: Vascular Surgery

## 2020-11-05 ENCOUNTER — Encounter (HOSPITAL_COMMUNITY)
Admission: RE | Disposition: A | Discharge status: Home / Self Care | Payer: Self-pay | Source: Home / Self Care | Attending: Gastroenterology

## 2020-11-05 ENCOUNTER — Encounter (HOSPITAL_COMMUNITY): Payer: Self-pay | Admitting: Gastroenterology

## 2020-11-05 ENCOUNTER — Other Ambulatory Visit: Payer: Self-pay

## 2020-11-05 DIAGNOSIS — K862 Cyst of pancreas: Secondary | ICD-10-CM

## 2020-11-05 DIAGNOSIS — Z8249 Family history of ischemic heart disease and other diseases of the circulatory system: Secondary | ICD-10-CM | POA: Insufficient documentation

## 2020-11-05 DIAGNOSIS — K863 Pseudocyst of pancreas: Secondary | ICD-10-CM

## 2020-11-05 DIAGNOSIS — Z87891 Personal history of nicotine dependence: Secondary | ICD-10-CM | POA: Insufficient documentation

## 2020-11-05 DIAGNOSIS — Z833 Family history of diabetes mellitus: Secondary | ICD-10-CM | POA: Insufficient documentation

## 2020-11-05 DIAGNOSIS — K209 Esophagitis, unspecified without bleeding: Secondary | ICD-10-CM | POA: Diagnosis not present

## 2020-11-05 DIAGNOSIS — K2289 Other specified disease of esophagus: Secondary | ICD-10-CM | POA: Diagnosis not present

## 2020-11-05 DIAGNOSIS — Z885 Allergy status to narcotic agent status: Secondary | ICD-10-CM | POA: Insufficient documentation

## 2020-11-05 DIAGNOSIS — K859 Acute pancreatitis without necrosis or infection, unspecified: Secondary | ICD-10-CM

## 2020-11-05 HISTORY — PX: ESOPHAGOGASTRODUODENOSCOPY (EGD) WITH PROPOFOL: SHX5813

## 2020-11-05 HISTORY — PX: EUS: SHX5427

## 2020-11-05 HISTORY — PX: BIOPSY: SHX5522

## 2020-11-05 SURGERY — ESOPHAGOGASTRODUODENOSCOPY (EGD) WITH PROPOFOL
Anesthesia: Monitor Anesthesia Care

## 2020-11-05 MED ORDER — PROPOFOL 500 MG/50ML IV EMUL
INTRAVENOUS | Status: DC | PRN
  Administered 2020-11-05: 150 ug/kg/min via INTRAVENOUS

## 2020-11-05 MED ORDER — LACTATED RINGERS IV SOLN: INTRAVENOUS | Status: DC

## 2020-11-05 MED ORDER — LIDOCAINE 2% (20 MG/ML) 5 ML SYRINGE
INTRAMUSCULAR | Status: DC | PRN
  Administered 2020-11-05: 100 mg via INTRAVENOUS

## 2020-11-05 MED ORDER — SODIUM CHLORIDE 0.9 % IV SOLN: INTRAVENOUS | Status: DC

## 2020-11-05 MED ORDER — ONDANSETRON HCL 4 MG/2ML IJ SOLN
INTRAMUSCULAR | Status: DC | PRN
  Administered 2020-11-05: 4 mg via INTRAVENOUS

## 2020-11-05 MED ORDER — PROPOFOL 10 MG/ML IV BOLUS
INTRAVENOUS | Status: DC | PRN
  Administered 2020-11-05: 50 mg via INTRAVENOUS

## 2020-11-05 SURGICAL SUPPLY — 15 items

## 2020-11-05 NOTE — Transfer of Care (Signed)
Immediate Anesthesia Transfer of Care Note  Patient: Glenn [name]  Procedure(s) Performed: ESOPHAGOGASTRODUODENOSCOPY (EGD) WITH PROPOFOL (N/A ) UPPER ENDOSCOPIC ULTRASOUND (EUS) RADIAL (N/A ) BIOPSY  Patient Location: PACU  Anesthesia Type:MAC  Level of Consciousness: drowsy and patient cooperative  Airway & Oxygen Therapy: Patient Spontanous Breathing  Post-op Assessment: Report given to RN and Post -op Vital signs reviewed and stable  Post vital signs: Reviewed and stable  Last Vitals:  Vitals Value Taken Time  BP 127/88 11/05/20 1200  Temp 36.1 C 11/05/20 1145  Pulse 70 11/05/20 1210  Resp 16 11/05/20 1210  SpO2 98 % 11/05/20 1210  Vitals shown include unvalidated device data.  Last Pain:  Vitals:   11/05/20 1145  TempSrc:   PainSc: 0-No pain         Complications: No complications documented.
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
## 2020-11-05 NOTE — Anesthesia Postprocedure Evaluation (Signed)
Anesthesia Post Note  Patient: Glenn [name]  Procedure(s) Performed: ESOPHAGOGASTRODUODENOSCOPY (EGD) WITH PROPOFOL (N/A ) UPPER ENDOSCOPIC ULTRASOUND (EUS) RADIAL (N/A ) BIOPSY     Patient location during evaluation: PACU Anesthesia Type: MAC Level of consciousness: awake and alert Pain management: pain level controlled Vital Signs Assessment: post-procedure vital signs reviewed and stable Respiratory status: spontaneous breathing and respiratory function stable Cardiovascular status: stable Postop Assessment: no apparent nausea or vomiting Anesthetic complications: no   No complications documented.  Last Vitals:  Vitals:   11/05/20 1145 11/05/20 1200  BP: (!) 126/103 127/88  Pulse: 76 74  Resp: 12 15  Temp: (!) 36.1 C   SpO2: 97% 100%    Last Pain:  Vitals:   11/05/20 1145  TempSrc:   PainSc: 0-No pain                 Merlinda Frederick

## 2020-11-05 NOTE — Anesthesia Procedure Notes (Signed)
Procedure Name: MAC Date/Time: 11/05/2020 11:00 AM Performed by: Georgia Duff, CRNA Pre-anesthesia Checklist: Emergency Drugs available, Patient identified, Suction available and Patient being monitored Oxygen Delivery Method: Nasal cannula Induction Type: IV induction Placement Confirmation: positive ETCO2 Dental Injury: Teeth and Oropharynx as per pre-operative assessment

## 2020-11-05 NOTE — Op Note (Signed)
[facility] Patient Name: Glenn [name] Procedure Date : 11/05/2020 MRN: [id number] Attending MD: Justice Britain , MD Date of Birth: [birth year] CSN: [id number] Age: 40 Admit Type: Outpatient Procedure:                Upper EUS Indications:              Acute pancreatitis history in 2019 believed                            secondary to Alcohol consumption and then                            Pancreatic cyst on MRCP/CT found in 2021, Follow-up                            of esophagitis Providers:                Justice Britain, MD, Jeanella Cara, RN,                            Cletis Athens, Technician Referring MD:              Medicines:                Monitored Anesthesia Care Complications:            No immediate complications. Estimated Blood Loss:     Estimated blood loss was minimal. Procedure:                Pre-Anesthesia Assessment:                           - Prior to the procedure, a History and Physical                            was performed, and patient medications and                            allergies were reviewed. The patient's tolerance of                            previous anesthesia was also reviewed. The risks                            and benefits of the procedure and the sedation                            options and risks were discussed with the patient.                            All questions were answered, and informed consent                            was obtained. Prior Anticoagulants: The patient has                            taken no  previous anticoagulant or antiplatelet                            agents. ASA Grade Assessment: II - A patient with                            mild systemic disease. After reviewing the risks                            and benefits, the patient was deemed in                            satisfactory condition to undergo the procedure.                           After obtaining informed consent,  the endoscope was                            passed under direct vision. Throughout the                            procedure, the patient's blood pressure, pulse, and                            oxygen saturations were monitored continuously. The                            [id number]-[id number] ([id number]) Olympus gastroscope was                            introduced through the mouth, and advanced to the                            second part of duodenum. The [id number] ([id number])                            Olympus Linear EUS scope was introduced through the                            mouth, and advanced to the duodenum for ultrasound                            examination from the stomach and duodenum. The                            upper EUS was accomplished without difficulty. The                            patient tolerated the procedure. Scope In: Scope Out: Findings:      ENDOSCOPIC FINDING: :      White nummular lesions were noted in the proximal esophagus and in the       mid esophagus. Biopsies were taken with a cold forceps for histology to       rule  out Candida.      No other gross lesions were noted in the entire esophagus.      The Z-line was irregular and was found 40 cm from the incisors.      Striped mildly erythematous mucosa without bleeding was found in the       gastric body. Previously biopsied and negative for HP.      No other gross lesions were noted in the entire examined stomach.      No gross lesions were noted in the duodenal bulb, in the first portion       of the duodenum and in the second portion of the duodenum.      ENDOSONOGRAPHIC FINDING: :      Pancreatic parenchymal abnormalities were noted in the pancreatic tail.       These consisted of hyperechoic strands. Overt pancreatic cyst was not       noted, but due to the patient's anatomy his entire splenic hilum could       not be visualized due to how close this was to the [place] so a       distal tail lesion  could be missded, but felt to be unlikely.      There was no sign of significant endosonographic abnormality in the       pancreatic head, genu of the pancreas and pancreatic body.      The pancreatic duct had a normal endosonographic appearance in the head       (PD = 2.0 -> 1.3 mm), genu of the pancreas (PD = 1.0 mm), body of the       pancreas (PD = 1.0 -> 0.9 mm) and tail of the pancreas (PD = 0.5 mm).      There was no sign of significant endosonographic abnormality in the       common bile duct (2.8 mm -> 4.9 mm) and in the common hepatic duct (4.8       mm). An unremarkable gallbladder was identified. No evidence of       microcholedocholithiasis or microlithiasis      Endosonographic imaging of the ampulla showed no extrinsic compression,       intramural (subepithelial) lesion, mass, varices or wall thickening.      Endosonographic imaging in the visualized portion of the liver showed no       mass.      No malignant-appearing lymph nodes were visualized in the celiac region       (level 20), peripancreatic region and porta hepatis region.      The celiac region was visualized. Impression:               EGD Impression:                           - White nummular lesions in esophageal mucosa.                            Biopsied. No other gross lesions in esophagus.                           - Z-line irregular, 40 cm from the incisors.                           - Erythematous mucosa in the gastric  body. No other                            gross lesions in the stomach.                           - No gross lesions in the duodenal bulb, in the                            first portion of the duodenum and in the second                            portion of the duodenum.                           EUS Impression:                           - Pancreatic parenchymal abnormalities consisting                            of hyperechoic strands were noted in the pancreatic                             tail but otherwise there was no sign of significant                            pathology in the pancreatic head, genu of the                            pancreas and pancreatic body.                           - The pancreatic duct had a normal endosonographic                            appearance in the pancreatic head, genu of the                            pancreas, body of the pancreas and tail of the                            pancreas.                           - There was no sign of significant pathology in the                            common bile duct and in the common hepatic duct.                            Normal appearing gallbladder. No                            microcholedocholithiasis or  microlithiasis noted.                           - No malignant-appearing lymph nodes were                            visualized in the celiac region (level 20),                            peripancreatic region and porta hepatis region. Recommendation:           - The patient will be observed post-procedure,                            until all discharge criteria are met.                           - Discharge patient to home.                           - Patient has a contact number available for                            emergencies. The signs and symptoms of potential                            delayed complications were discussed with the                            patient. Return to normal activities tomorrow.                            Written discharge instructions were provided to the                            patient.                           - Resume previous diet.                           - Observe patient's clinical course.                           - Await path results.                           - Recommend restarting PPI 20 mg daily.                           - Consider repeat imaging study in 1-year (MRI had                            issues due to metallic shadowing  previoulsy, so a  CTAbdomen Pancreas protocol can be considered in                            November/December 2022 to ensure nothing else has                            developed in region of pancreatic tail.                           - The findings and recommendations were discussed                            with the patient.                           - The findings and recommendations were discussed                            with the patient's family. Procedure Code(s):        --- Professional ---                           [phone], Esophagogastroduodenoscopy, flexible,                            transoral; with endoscopic ultrasound examination                            limited to the esophagus, stomach or duodenum, and                            adjacent structures                           43239, Esophagogastroduodenoscopy, flexible,                            transoral; with biopsy, single or multiple Diagnosis Code(s):        --- Professional ---                           K22.8, Other specified diseases of esophagus                           K31.89, Other diseases of stomach and duodenum                           K86.9, Disease of pancreas, unspecified                           I89.9, Noninfective disorder of lymphatic vessels                            and lymph nodes, unspecified                           K86.2, Cyst of  pancreas                           K85.90, Acute pancreatitis without necrosis or                            infection, unspecified                           K20.90, Esophagitis, unspecified without bleeding CPT copyright 2019 American Medical Association. All rights reserved. The codes documented in this report are preliminary and upon coder review may  be revised to meet current compliance requirements. Justice Britain, MD 11/05/2020 11:58:56 AM Number of Addenda: 0

## 2020-11-06 ENCOUNTER — Ambulatory Visit: Payer: 59 | Admitting: Family Medicine

## 2020-11-06 ENCOUNTER — Other Ambulatory Visit: Payer: Self-pay

## 2020-11-06 ENCOUNTER — Other Ambulatory Visit: Payer: Self-pay | Admitting: Family Medicine

## 2020-11-06 LAB — SURGICAL PATHOLOGY

## 2020-11-06 NOTE — Telephone Encounter (Signed)
Rx refill request

## 2020-11-06 NOTE — Telephone Encounter (Signed)
Pt was sent a MyChart message to inform him Dr. Georgina Snell sent in his rx refill.

## 2020-11-08 ENCOUNTER — Encounter (HOSPITAL_COMMUNITY): Payer: Self-pay | Admitting: Gastroenterology

## 2020-11-10 ENCOUNTER — Encounter: Payer: Self-pay | Admitting: Gastroenterology

## 2020-11-12 ENCOUNTER — Encounter: Payer: Self-pay | Admitting: Family Medicine

## 2020-11-12 ENCOUNTER — Other Ambulatory Visit: Payer: Self-pay

## 2020-11-12 ENCOUNTER — Encounter: Payer: Self-pay | Admitting: Physical Therapy

## 2020-11-12 ENCOUNTER — Other Ambulatory Visit: Payer: Self-pay | Admitting: Family Medicine

## 2020-11-12 ENCOUNTER — Ambulatory Visit: Payer: 59 | Admitting: Physical Therapy

## 2020-11-12 DIAGNOSIS — M25661 Stiffness of right knee, not elsewhere classified: Secondary | ICD-10-CM

## 2020-11-12 DIAGNOSIS — M25461 Effusion, right knee: Secondary | ICD-10-CM | POA: Diagnosis not present

## 2020-11-12 DIAGNOSIS — M25561 Pain in right knee: Secondary | ICD-10-CM

## 2020-11-12 DIAGNOSIS — M6281 Muscle weakness (generalized): Secondary | ICD-10-CM | POA: Diagnosis not present

## 2020-11-12 NOTE — Patient Instructions (Signed)
Access Code: [email or web address] URL: [email or web address] Date: 11/12/2020 Prepared by: Daleen Bo  Exercises Supine Quad Set - 2 x daily - 7 x weekly - 2 sets - 10 reps - 3 hold Prone Quadriceps Stretch with Strap - 2 x daily - 7 x weekly - 1 sets - 3 reps - 30 hold Sitting Heel Slide with Towel - 2 x daily - 7 x weekly - 2 sets - 10 reps - 3 hold

## 2020-11-12 NOTE — Telephone Encounter (Signed)
Please advise.  Pt had sent MyChart Pt Advice Request message asking for dosage to be decreased to 25 mg if possible.
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
## 2020-11-20 ENCOUNTER — Encounter: Payer: Self-pay | Admitting: Physical Therapy

## 2020-11-20 ENCOUNTER — Ambulatory Visit: Payer: 59 | Admitting: Physical Therapy

## 2020-11-20 ENCOUNTER — Other Ambulatory Visit: Payer: Self-pay

## 2020-11-20 DIAGNOSIS — M6281 Muscle weakness (generalized): Secondary | ICD-10-CM

## 2020-11-20 DIAGNOSIS — M25561 Pain in right knee: Secondary | ICD-10-CM

## 2020-11-20 DIAGNOSIS — M25461 Effusion, right knee: Secondary | ICD-10-CM | POA: Diagnosis not present

## 2020-11-20 NOTE — Patient Instructions (Signed)
Access Code: [email or web address] URL: [email or web address] Date: 11/20/2020 Prepared by: Daleen Bo  Exercises Supine Quad Set - 2 x daily - 7 x weekly - 2 sets - 10 reps - 3 hold Sitting Heel Slide with Towel - 2 x daily - 7 x weekly - 2 sets - 10 reps - 3 hold Standing Heel Raise - 1 x daily - 7 x weekly - 3 sets - 10 reps Seated Long Arc Quad - 1 x daily - 7 x weekly - 2 sets - 10 reps
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
## 2020-11-22 ENCOUNTER — Other Ambulatory Visit: Payer: Self-pay | Admitting: Psychiatry

## 2020-11-22 ENCOUNTER — Other Ambulatory Visit: Payer: Self-pay | Admitting: Family Medicine

## 2020-11-22 DIAGNOSIS — F411 Generalized anxiety disorder: Secondary | ICD-10-CM

## 2020-11-22 DIAGNOSIS — F410 Panic disorder [episodic paroxysmal anxiety] without agoraphobia: Secondary | ICD-10-CM

## 2020-11-23 NOTE — Telephone Encounter (Signed)
Please advise 

## 2020-11-27 ENCOUNTER — Encounter: Payer: 59 | Admitting: Physical Therapy

## 2020-12-04 ENCOUNTER — Encounter: Payer: 59 | Admitting: Physical Therapy

## 2020-12-06 ENCOUNTER — Other Ambulatory Visit: Payer: Self-pay | Admitting: Family Medicine

## 2020-12-07 ENCOUNTER — Other Ambulatory Visit: Payer: Self-pay | Admitting: Family Medicine

## 2020-12-09 ENCOUNTER — Other Ambulatory Visit: Payer: Self-pay

## 2020-12-09 ENCOUNTER — Encounter: Payer: Self-pay | Admitting: Physical Therapy

## 2020-12-09 ENCOUNTER — Ambulatory Visit: Payer: 59 | Admitting: Physical Therapy

## 2020-12-09 DIAGNOSIS — M6281 Muscle weakness (generalized): Secondary | ICD-10-CM | POA: Diagnosis not present

## 2020-12-09 DIAGNOSIS — M25561 Pain in right knee: Secondary | ICD-10-CM

## 2020-12-09 DIAGNOSIS — M25461 Effusion, right knee: Secondary | ICD-10-CM | POA: Diagnosis not present
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
## 2020-12-09 NOTE — Patient Instructions (Signed)
Access Code: [email or web address] URL: [email or web address] Date: 12/09/2020 Prepared by: Daleen Bo  Exercises Supine Quad Set - 2 x daily - 7 x weekly - 2 sets - 10 reps - 3 hold Sitting Heel Slide with Towel - 2 x daily - 7 x weekly - 2 sets - 10 reps - 3 hold Single Leg Stance - 1 x daily - 7 x weekly - 2 sets - 3 reps - 30 hold Seated Long Arc Quad - 1 x daily - 3-4 x weekly - 2 sets - 10 reps Standing Bilateral Heel Raise on Step - 1 x daily - 3-4 x weekly - 2 sets - 10 reps - 5 hold Wall Sit- 1 x daily - 3-4 x weekly - 2 sets - 3 reps - 30 hold

## 2020-12-17 ENCOUNTER — Other Ambulatory Visit: Payer: Self-pay

## 2020-12-17 ENCOUNTER — Other Ambulatory Visit: Payer: Self-pay | Admitting: Family Medicine

## 2020-12-17 ENCOUNTER — Ambulatory Visit: Payer: 59 | Admitting: Physical Therapy

## 2020-12-17 ENCOUNTER — Encounter: Payer: Self-pay | Admitting: Physical Therapy

## 2020-12-17 DIAGNOSIS — M25561 Pain in right knee: Secondary | ICD-10-CM | POA: Diagnosis not present

## 2020-12-17 DIAGNOSIS — M6281 Muscle weakness (generalized): Secondary | ICD-10-CM

## 2020-12-17 DIAGNOSIS — M25461 Effusion, right knee: Secondary | ICD-10-CM
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
## 2020-12-17 NOTE — Patient Instructions (Signed)
Access Code: [email or web address] URL: [email or web address] Date: 12/17/2020 Prepared by: Daleen Bo  Exercises Supine Quad Set - 2 x daily - 7 x weekly - 2 sets - 10 reps - 3 hold Sitting Heel Slide with Towel - 2 x daily - 7 x weekly - 2 sets - 10 reps - 3 hold Single Leg Stance - 1 x daily - 7 x weekly - 2 sets - 3 reps - 30 hold Seated Long Arc Quad - 1 x daily - 3-4 x weekly - 2 sets - 10 reps Standing Bilateral Heel Raise on Step - 1 x daily - 3-4 x weekly - 2 sets - 10 reps - 5 hold

## 2020-12-18 NOTE — Telephone Encounter (Signed)
Please advise 

## 2020-12-22 ENCOUNTER — Encounter: Payer: Self-pay | Admitting: Family Medicine

## 2020-12-22 ENCOUNTER — Other Ambulatory Visit: Payer: Self-pay

## 2020-12-22 ENCOUNTER — Ambulatory Visit (INDEPENDENT_AMBULATORY_CARE_PROVIDER_SITE_OTHER): Payer: 59

## 2020-12-22 ENCOUNTER — Ambulatory Visit: Payer: 59 | Admitting: Family Medicine

## 2020-12-22 ENCOUNTER — Ambulatory Visit: Payer: Self-pay

## 2020-12-22 VITALS — BP 150/108 | HR 99 | Ht 68.0 in | Wt 218.0 lb

## 2020-12-22 DIAGNOSIS — M5416 Radiculopathy, lumbar region: Secondary | ICD-10-CM

## 2020-12-22 DIAGNOSIS — M7121 Synovial cyst of popliteal space [Baker], right knee: Secondary | ICD-10-CM | POA: Diagnosis not present

## 2020-12-22 DIAGNOSIS — G8929 Other chronic pain: Secondary | ICD-10-CM | POA: Diagnosis not present

## 2020-12-22 DIAGNOSIS — M25561 Pain in right knee: Secondary | ICD-10-CM

## 2020-12-22 MED ORDER — GABAPENTIN 300 MG PO CAPS
300.0000 mg | ORAL_CAPSULE | Freq: Three times a day (TID) | ORAL | 3 refills | Status: DC | PRN
Start: 2020-12-22 — End: 2020-12-30

## 2020-12-22 MED ORDER — PREDNISONE 50 MG PO TABS: 50.0000 mg | ORAL_TABLET | Freq: Every day | ORAL | 0 refills | Status: DC

## 2020-12-22 NOTE — Patient Instructions (Addendum)
Thank you for coming in today.  Please get an Xray today before you leave  Plan for orthopedic surgery eval.   I think you have a pinched nerve in your back.   Take the prednisone for 5 days.   Use the gabapentin as needed. Mostly at bedtime.   Keep me updated.    Radicular Pain Radicular pain is a type of pain that spreads from your back or neck along a spinal nerve. Spinal nerves are nerves that leave the spinal cord and go to the muscles. Radicular pain is sometimes called radiculopathy, radiculitis, or a pinched nerve. When you have this type of pain, you may also have weakness, numbness, or tingling in the area of your body that is supplied by the nerve. The pain may feel sharp and burning. Depending on which spinal nerve is affected, the pain may occur in the:  Neck area (cervical radicular pain). You may also feel pain, numbness, weakness, or tingling in the arms.  Mid-spine area (thoracic radicular pain). You would feel this pain in the back and chest. This type is rare.  Lower back area (lumbar radicular pain). You would feel this pain as low back pain. You may feel pain, numbness, weakness, or tingling in the buttocks or legs. Sciatica is a type of lumbar radicular pain that shoots down the back of the leg. Radicular pain occurs when one of the spinal nerves becomes irritated or squeezed (compressed). It is often caused by something pushing on a spinal nerve, such as one of the bones of the spine (vertebrae) or one of the round cushions between vertebrae (intervertebral disks). This can result from:  An injury.  Wear and tear or aging of a disk.  The growth of a bone spur that pushes on the nerve. Radicular pain often goes away when you follow instructions from your health care provider for relieving pain at home. Follow these instructions at home: Managing pain  If directed, put ice on the affected area: ? Put ice in a plastic bag. ? Place a towel between your skin and  the bag. ? Leave the ice on for 20 minutes, 2-3 times a day.  If directed, apply heat to the affected area as often as told by your health care provider. Use the heat source that your health care provider recommends, such as a moist heat pack or a heating pad. ? Place a towel between your skin and the heat source. ? Leave the heat on for 20-30 minutes. ? Remove the heat if your skin turns bright red. This is especially important if you are unable to feel pain, heat, or cold. You may have a greater risk of getting burned.      Activity  Do not sit or rest in bed for long periods of time.  Try to stay as active as possible. Ask your health care provider what type of exercise or activity is best for you.  Avoid activities that make your pain worse, such as bending and lifting.  Do not lift anything that is heavier than 10 lb (4.5 kg), or the limit that you are told, until your health care provider says that it is safe.  Practice using proper technique when lifting items. Proper lifting technique involves bending your knees and rising up.  Do strength and range-of-motion exercises only as told by your health care provider or physical therapist.   General instructions  Take over-the-counter and prescription medicines only as told by your health care provider.  Pay attention to any changes in your symptoms.  Keep all follow-up visits as told by your health care provider. This is important. ? Your health care provider may send you to a physical therapist to help with this pain. Contact a health care provider if:  Your pain and other symptoms get worse.  Your pain medicine is not helping.  Your pain has not improved after a few weeks of home care.  You have a fever. Get help right away if:  You have severe pain, weakness, or numbness.  You have difficulty with bladder or bowel control. Summary  Radicular pain is a type of pain that spreads from your back or neck along a spinal  nerve.  When you have radicular pain, you may also have weakness, numbness, or tingling in the area of your body that is supplied by the nerve.  The pain may feel sharp or burning.  Radicular pain may be treated with ice, heat, medicines, or physical therapy. This information is not intended to replace advice given to you by your health care provider. Make sure you discuss any questions you have with your health care provider. Document Revised: 02/27/2018 Document Reviewed: 02/27/2018 Elsevier Patient Education  2021 [employer].

## 2020-12-22 NOTE — Progress Notes (Signed)
I, Glenn [name], LAT, ATC acting as a scribe for Glenn Leader, MD.  Glenn [name] is a 40 y.o. male who presents to [place] at [facility] today for f/u R knee pain. Pt was last seen by Dr. Georgina [name] on 10/26/20 and had a Baker's cyst aspirated and injected and was referred to PT of which he's completed 4 visits. Pt was also prescribed limited tramadol. Today, pt reports that his R knee pain has increased over the past 1.5 weeks.  He states that yesterday his knee popped while he was weed-eating on a steep incline.  He reports his R knee is more swollen.  He states that he feels sharp pain on the R ant knee w/ shooting pain down his shin and pressure in his R post knee.  He is taking Tramadol but states this is not helping w/ his current pain.   Dx imaging: 09/27/20 R knee MRI  08/05/20 R knee XR  05/07/18 R knee XR  Pertinent review of systems: No fevers or chills  Relevant historical information: History of pancreatic pseudocyst   Exam:  BP (!) 150/108 (BP Location: Left Arm, Patient Position: Sitting, Cuff Size: Normal)   Pulse 99   Ht 5\' 8"  (1.727 m)   Wt 218 lb (98.9 kg)   SpO2 97%   BMI 33.15 kg/m  General: Well Developed, well nourished, and in no acute distress.   MSK: Right knee no joint effusion however swollen at the posterior medial aspect of the knee. Normal motion Tender palpation posterior knee. Intact strength. Stable ligamentous exam.  L-spine normal-appearing Nontender midline. Positive right-sided slump test. Lower extremity strength is intact. Reflexes are intact.    Lab and Radiology Results  X-ray images L-spine obtained today personally and independently interpreted No significant degenerative changes.  No acute findings. Await formal radiology review  EXAM: MRI OF THE RIGHT KNEE WITHOUT CONTRAST  TECHNIQUE: Multiplanar, multisequence MR imaging of the knee was performed. No intravenous contrast was administered.  COMPARISON:   08/05/2020  FINDINGS: MENISCI  Medial meniscus:  Unremarkable  Lateral meniscus:  Unremarkable  LIGAMENTS  Cruciates:  Unremarkable  Collaterals:  Unremarkable  CARTILAGE  Patellofemoral: Mild chondral heterogeneity centrally along the femoral trochlear groove suggesting mild chondromalacia on image 18 of series 7.  Medial:  Unremarkable  Lateral:  Unremarkable  Joint:  Unremarkable  Popliteal Fossa:  Small to moderate size Baker's cyst.  Extensor Mechanism:  Unremarkable  Bones: No significant extra-articular osseous abnormalities identified.  Other: No supplemental non-categorized findings.  IMPRESSION: 1. Small to moderate size Baker's cyst. 2. Mild chondral heterogeneity centrally along the femoral trochlear groove suggesting mild chondromalacia.   Electronically Signed   By: Van Clines M.D.   On: 09/27/2020 11:47  I, Glenn [name], personally (independently) visualized and performed the interpretation of the images attached in this note.  Diagnostic Limited MSK Ultrasound of: Right knee Quad tendon intact normal. Small joint effusion. Patellar tendon intact normal. Medial and lateral joint lines no clear meniscus tear. Moderate to large Baker's cyst present posterior knee Impression: Baker cyst   Assessment and Plan: 40 y.o. male with acute on chronic right knee pain.  Patient has a persistently bothersome Baker's cyst right knee.  He has had multiple attempts at aspiration and injection with no prolonged resolution of pain.  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  At this point I think  he is failing conservative management and is at least worthwhile having a conversation with orthopedic surgery regarding surgical options.  Referral placed today to orthopedic surgery.  Additionally he has new anterior leg pain radiating from the knee to  the anterior shin all the way down to the medial great toe.  This is consistent with L4 radiculopathy and he has a positive right-sided slump test.  Fortunately he does not have any weakness.  Plan to treat with prednisone and gabapentin.  Consider MRI in the future if needed.   PDMP reviewed during this encounter. Orders Placed This Encounter  Procedures  . [place] LIMITED JOINT SPACE STRUCTURES LOW RIGHT(NO LINKED CHARGES)    Order Specific Question:   Reason for Exam (SYMPTOM  OR DIAGNOSIS REQUIRED)    Answer:   R knee pain    Order Specific Question:   Preferred imaging location?    Answer:   [place] [facility]  . DG Lumbar Spine 2-3 Views    Standing Status:   Future    Number of Occurrences:   1    Standing Expiration Date:   12/22/2021    Order Specific Question:   Reason for Exam (SYMPTOM  OR DIAGNOSIS REQUIRED)    Answer:   eval rt L4 rad    Order Specific Question:   Preferred imaging location?    Answer:   [facility]  . Ambulatory referral to Orthopedic Surgery    Referral Priority:   Routine    Referral Type:   Surgical    Referral Reason:   Specialty Services Required    Requested Specialty:   Orthopedic Surgery    Number of Visits Requested:   1   Meds ordered this encounter  Medications  . predniSONE (DELTASONE) 50 MG tablet    Sig: Take 1 tablet (50 mg total) by mouth daily.    Dispense:  5 tablet    Refill:  0  . gabapentin (NEURONTIN) 300 MG capsule    Sig: Take 1 capsule (300 mg total) by mouth 3 (three) times daily as needed.    Dispense:  90 capsule    Refill:  3     Discussed warning signs or symptoms. Please see discharge instructions. Patient expresses understanding.   The above documentation has been reviewed and is accurate and complete Glenn [name], M.D.

## 2020-12-23 ENCOUNTER — Encounter: Payer: Self-pay | Admitting: Family Medicine

## 2020-12-23 NOTE — Progress Notes (Signed)
Lumbar spine x-ray looks normal to radiology.

## 2020-12-24 ENCOUNTER — Other Ambulatory Visit: Payer: Self-pay

## 2020-12-24 DIAGNOSIS — M25561 Pain in right knee: Secondary | ICD-10-CM

## 2020-12-24 DIAGNOSIS — M7120 Synovial cyst of popliteal space [Baker], unspecified knee: Secondary | ICD-10-CM

## 2020-12-25 ENCOUNTER — Encounter: Payer: Self-pay | Admitting: Orthopaedic Surgery

## 2020-12-25 ENCOUNTER — Ambulatory Visit: Payer: Self-pay

## 2020-12-25 ENCOUNTER — Ambulatory Visit: Payer: 59 | Admitting: Orthopaedic Surgery

## 2020-12-25 ENCOUNTER — Encounter: Payer: 59 | Admitting: Physical Therapy

## 2020-12-25 VITALS — Ht 68.0 in | Wt 218.0 lb

## 2020-12-25 DIAGNOSIS — M545 Low back pain, unspecified: Secondary | ICD-10-CM | POA: Diagnosis not present

## 2020-12-25 DIAGNOSIS — M25561 Pain in right knee: Secondary | ICD-10-CM

## 2020-12-25 DIAGNOSIS — G8929 Other chronic pain: Secondary | ICD-10-CM | POA: Diagnosis not present
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
## 2020-12-28 LAB — SEDIMENTATION RATE: Sed Rate: 2 mm/h (ref 0–15)

## 2020-12-28 LAB — ANA: Anti Nuclear Antibody (ANA): NEGATIVE

## 2020-12-28 LAB — URIC ACID: Uric Acid, Serum: 7.1 mg/dL (ref 4.0–8.0)

## 2020-12-28 LAB — RHEUMATOID FACTOR: Rheumatoid fact SerPl-aCnc: <14 [IU]/mL

## 2020-12-28 NOTE — Patient Instructions (Incomplete)
Health Maintenance Due  Topic Date Due  . COVID-19 Vaccine (3 - Booster for Moderna series) 06/17/2020   Depression screen [facility] 2/9 08/05/2020 12/03/2018 09/04/2017  Decreased Interest 0 3 0  Down, Depressed, Hopeless 0 - 1  PHQ - 2 Score 0 3 1  Altered sleeping 0 3 -  Tired, decreased energy 3 3 -  Change in appetite 0 3 -  Feeling bad or failure about yourself  0 3 -  Trouble concentrating 0 3 -  Moving slowly or fidgety/restless 0 0 -  Suicidal thoughts 0 1 -  PHQ-9 Score 3 19 -  Difficult doing work/chores Somewhat difficult Somewhat difficult -

## 2020-12-28 NOTE — Progress Notes (Deleted)
Phone [phone] In person visit   Subjective:   Glenn [name] is a 40 y.o. year old very pleasant male patient who presents for/with See problem oriented charting No chief complaint on file.   This visit occurred during the SARS-CoV-2 public health emergency.  Safety protocols were in place, including screening questions prior to the visit, additional usage of staff PPE, and extensive cleaning of exam room while observing appropriate contact time as indicated for disinfecting solutions.   Past Medical History-  Patient Active Problem List   Diagnosis Date Noted  . Baker's cyst of knee, right 10/27/2020  . Patellofemoral pain syndrome of right knee 10/27/2020  . Rosacea 10/13/2020  . Pancreatic pseudocyst 08/05/2020  . Elevated ferritin 08/05/2020  . Abdominal pain 07/27/2020  . Acute hepatitis 07/27/2020  . Mood disorder with depressive features due to general medical condition 10/28/2018  . Chronic fatigue 10/28/2018  . GAD (generalized anxiety disorder) 07/02/2018  . Major depression in full remission ([place]) 07/02/2018  . Acute pain of right knee 05/14/2018  . Elevated uric acid in blood 03/16/2018  . Hepatic steatosis 02/11/2018  . Hyperglycemia 02/11/2018  . Alcohol use disorder, moderate, in controlled environment ([place]) 02/11/2018  . Pancreatitis, alcoholic, acute 70/62/3762  . Tobacco abuse 02/24/2015  . Essential hypertension, benign 02/18/2014  . Panic disorder 12/10/2013    Medications- reviewed and updated Current Outpatient Medications  Medication Sig Dispense Refill  . Benzonatate (TESSALON PERLES PO) Take 1 capsule by mouth 2 (two) times daily as needed (cough).    . diazepam (VALIUM) 10 MG tablet Take 1 tablet (10 mg total) by mouth 2 (two) times daily as needed for anxiety (panic). 60 tablet 5  . doxycycline (ADOXA) 100 MG tablet Take 100 mg by mouth 2 (two) times daily. For 1 week    . fluvoxaMINE (LUVOX) 100 MG tablet TAKE 1 TABLET(100 MG) BY MOUTH AT  BEDTIME (Patient taking differently: Take 100 mg by mouth at bedtime.) 30 tablet 5  . FOLIC ACID PO Take 1 tablet by mouth daily.    Marland Kitchen gabapentin (NEURONTIN) 300 MG capsule Take 1 capsule (300 mg total) by mouth 3 (three) times daily as needed. 90 capsule 3  . losartan (COZAAR) 100 MG tablet TAKE 1 TABLET(100 MG) BY MOUTH DAILY (Patient taking differently: Take 100 mg by mouth daily.) 90 tablet 3  . methylphenidate (RITALIN) 10 MG tablet Take 1 tablet (10 mg total) by mouth 3 (three) times daily with meals. 90 tablet 0  . metroNIDAZOLE (METROGEL) 1 % gel Apply topically daily. For rosacea 60 g 2  . OVER THE COUNTER MEDICATION Take 6 capsules by mouth See admin instructions. Pt takes fruit and vegetable supplement. 3 fruit capsules and 3 vegetable capsules once daily    . OVER THE COUNTER MEDICATION Take 1 Scoop by mouth daily. Super beets supplement powder. / 15 ml    . predniSONE (DELTASONE) 50 MG tablet Take 1 tablet (50 mg total) by mouth daily. 5 tablet 0  . traMADol (ULTRAM) 50 MG tablet TAKE 1/2 TABLET BY MOUTH EVERY 12 HOURS AS NEEDED. MAY TAKE 1 TABLET BY MOUTH EVERY 6 HOURS AS NEEDED 10 tablet 0  . traZODone (DESYREL) 100 MG tablet TAKE 1 TABLET(100 MG) BY MOUTH AT BEDTIME AS NEEDED FOR SLEEP 30 tablet 5   No current facility-administered medications for this visit.     Objective:  There were no vitals taken for this visit. Gen: NAD, resting comfortably CV: RRR no murmurs rubs or gallops  Lungs: CTAB no crackles, wheeze, rhonchi Abdomen: soft/nontender/nondistended/normal bowel sounds. No rebound or guarding.  Ext: no edema Skin: warm, dry Neuro: grossly normal, moves all extremities  ***    Assessment and Plan  # Anxiety S:Medication: ***  Counseling: *** No flowsheet data found. A/P: ***    # Hyperglycemia/insulin resistance/prediabetes S:  Medication: *** Exercise and diet- *** No results found for: HGBA1C  A/P: ***  #hypertension S: medication: Losartan 100mg   daily Home readings #s: *** BP Readings from Last 3 Encounters:  12/22/20 (!) 150/108  11/05/20 129/88  10/26/20 122/86  A/P: ***    Noted during hospitalization November 2021 with 6 to 62-month repeat.  I set a reminder to check in 6 months***   hemochromatosis ***? repeat workup  No problem-specific Assessment & Plan notes found for this encounter.   Recommended follow up: ***No follow-ups on file. Future Appointments  Date Time Provider [place]  12/29/2020  1:20 PM Marin Olp, MD [facility]  01/14/2021  2:00 PM Donnal Moat T, PA-C [facility] None  01/22/2021  9:00 AM [facility] 1 [facility] [place]    Lab/Order associations: No diagnosis found.  No orders of the defined types were placed in this encounter.   Time Spent: *** minutes of total time (8:58 PM***- 8:58 PM***) was spent on the date of the encounter performing the following actions: chart review prior to seeing the patient, obtaining history, performing a medically necessary exam, counseling on the treatment plan, placing orders, and documenting in our EHR.   Return precautions advised.  Clyde Lundborg, CMA

## 2020-12-29 ENCOUNTER — Ambulatory Visit: Payer: 59 | Admitting: Family Medicine

## 2020-12-29 ENCOUNTER — Encounter: Payer: Self-pay | Admitting: Family Medicine

## 2020-12-29 DIAGNOSIS — F3342 Major depressive disorder, recurrent, in full remission: Secondary | ICD-10-CM

## 2020-12-29 DIAGNOSIS — I10 Essential (primary) hypertension: Secondary | ICD-10-CM

## 2020-12-29 DIAGNOSIS — F411 Generalized anxiety disorder: Secondary | ICD-10-CM

## 2020-12-29 DIAGNOSIS — R739 Hyperglycemia, unspecified: Secondary | ICD-10-CM

## 2020-12-29 NOTE — Progress Notes (Signed)
Phone [phone] In person visit   Subjective:   Glenn [name] is a 40 y.o. year old very pleasant male patient who presents for/with See problem oriented charting Chief Complaint  Patient presents with  . Hypertension  . Knee Pain    Right knee pain. The pain has gotten worst since you last seen him. Pain level 9/10 the knee pain is starting to disable him from the things that he has to do in his day to day life.    This visit occurred during the SARS-CoV-2 public health emergency.  Safety protocols were in place, including screening questions prior to the visit, additional usage of staff PPE, and extensive cleaning of exam room while observing appropriate contact time as indicated for disinfecting solutions.   Past Medical History-  Patient Active Problem List   Diagnosis Date Noted  . Pancreatic pseudocyst 08/05/2020    Priority: High  . Acute hepatitis 07/27/2020    Priority: High  . Hepatic steatosis 02/11/2018    Priority: High  . Alcohol use disorder, moderate, in controlled environment ([place]) 02/11/2018    Priority: High  . Rosacea 10/13/2020    Priority: Medium  . Chronic fatigue 10/28/2018    Priority: Medium  . GAD (generalized anxiety disorder) 07/02/2018    Priority: Medium  . Major depression in full remission ([place]) 07/02/2018    Priority: Medium  . Tobacco abuse 02/24/2015    Priority: Medium  . Essential hypertension, benign 02/18/2014    Priority: Medium  . Panic disorder 12/10/2013    Priority: Medium  . Hyperglycemia 02/11/2018    Priority: Low  . Pancreatitis, alcoholic, acute 60/63/0160    Priority: Low  . Baker's cyst of knee, right 10/27/2020  . Patellofemoral pain syndrome of right knee 10/27/2020  . Elevated ferritin 08/05/2020  . Abdominal pain 07/27/2020  . Mood disorder with depressive features due to general medical condition 10/28/2018  . Acute pain of right knee 05/14/2018  . Elevated uric acid in blood 03/16/2018    Medications-  reviewed and updated Current Outpatient Medications  Medication Sig Dispense Refill  . diazepam (VALIUM) 10 MG tablet Take 1 tablet (10 mg total) by mouth 2 (two) times daily as needed for anxiety (panic). 60 tablet 5  . fluvoxaMINE (LUVOX) 100 MG tablet TAKE 1 TABLET(100 MG) BY MOUTH AT BEDTIME (Patient taking differently: Take 100 mg by mouth at bedtime.) 30 tablet 5  . FOLIC ACID PO Take 1 tablet by mouth daily.    Marland Kitchen HYDROcodone-acetaminophen (NORCO/VICODIN) 5-325 MG tablet Take 1 tablet by mouth every 6 (six) hours as needed for moderate pain or severe pain. Do not take valium within 12 hours and do not drive for 8 hours after taking this medicine 20 tablet 0  . losartan (COZAAR) 100 MG tablet TAKE 1 TABLET(100 MG) BY MOUTH DAILY (Patient taking differently: Take 100 mg by mouth daily.) 90 tablet 3  . methylphenidate (RITALIN) 10 MG tablet Take 1 tablet (10 mg total) by mouth 3 (three) times daily with meals. 90 tablet 0  . OVER THE COUNTER MEDICATION Take 6 capsules by mouth See admin instructions. Pt takes fruit and vegetable supplement. 3 fruit capsules and 3 vegetable capsules once daily    . OVER THE COUNTER MEDICATION Take 1 Scoop by mouth daily. Super beets supplement powder. / 15 ml    . pantoprazole (PROTONIX) 40 MG tablet Take by mouth.    . traZODone (DESYREL) 100 MG tablet TAKE 1 TABLET(100 MG) BY MOUTH AT  BEDTIME AS NEEDED FOR SLEEP 30 tablet 5   No current facility-administered medications for this visit.     Objective:  BP 138/86   Pulse (!) 102   Temp 98.9 F (37.2 C) (Temporal)   Ht 5\' 8"  (1.727 m)   Wt 220 lb 9.6 oz (100.1 kg)   SpO2 99%   BMI 33.54 kg/m  Gen: NAD, resting comfortably CV: RRR (HR improved by time of exam) no murmurs rubs or gallops Lungs: CTAB no crackles, wheeze, rhonchi Ext: no edema Skin: warm, dry Patient has right leg propped up on step as I enter room- he is bent over and appears to be in significant pain. On exam is tender above right knee  bilaterally - very tender. Also has tenderness posterior. No significant joint line pain. Reports pain from below knee down to big toe. Reports upper back pain but not lower back pain at this time    Assessment and Plan   # Right knee pain S:my first visit with patient was in December 2021 about this pain -he had recently come home from hospitalization for acute hepatitis thought to be possibly alcohol-related-he also had extensive hepatic steatosis per CT- per GI notes.  Other work-up largely reassuring other than ferritin level being elevated.  Had recommended AA previously.  After hospitalization patient had severe episode of knee pain which was similar to hospitalization he had in 2019-no clear source of infection and urgent referral to sports medicine was made.  Has not appeared to obviously be gout though uric acid levels have been above 6 usually in the 7 range.  Gonorrhea and Chlamydia were negative in regards to reactive arthritis.  Patient has been working with Dr. Georgina Snell for several months.  Patient has periods of improvement and then periods of worsening.  Recently,patient reports 9/10 knee pain. Finds this is getting in the way of day to day acctivities . Has had MRi with Dr. Georgina Snell which showed baker's cyst and some arthritis under kneecap but out of proportion to expected level of pain. Was given steroid injection back in January- has had several attempted aspirations and injections without relief. We were filling tramadol at last visit (had come off of oxycodone as made him jittery). Knows not to take tramadol or oxycodone or hydrocodone within 12 hours of valium.  Most recently tramadol has not been effective though.  Felt like physical therapy was helping and then things worsened between visits and now with constant stabbing pain  There was also concern on 12/22/20 visit about L4 radiculopathy- started on prednisone and gabapentin- has had some mild relief with the prednisone. He did not  tolerate the gabapentin- had a panic attack but finished prednisone- pain in anterior shin has improved though hard to say level of pain  Has also been seen by Dr. Erlinda Hong after referral from Dr. Georgina Snell- no clear explanation from pain. Rheumatological labs ordered and MRI of lumbar spine ordered to assess if this could be coming from lumbar spine. Labs- RF negative, ANA negative, sed rate not elevated. Uric acid above goal for gout of 6 or less but this has not clearly been gout related. Also has had prior autoimmune workup by Dr. Georgina Snell which was not revealing  Also has seen murphy/wainer- we have not gotten results yet from this- sounds like they were looking at gel injections for the knee like synvisc- they are having to get approval.  A/P: Right knee pain of unclear etiology-does have some arthritis and Baker's cyst but  pain seems out of proportion to this.  Could have radicular pain related to his back and pending MRI. - For acute worsening of pain control we will prescribe hydrocodone (tramadol not working and oxycodone made him jittery).  Should not drive for 8 hours after use and should not take Valium within 8 hours of this medication. -At this point continue follow-up with Dr. Erlinda Hong. Could also consider outside referral or PM&R if needed (discussed with patient we may have to look at route like this). I also asked if any med changes prior to December and none clearly found -Works for [employer] on fridays but has not been able to. 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 Up to a day or two a week if pain is flaring (also would have some risk with pain medicine with certain equipment)  #hypertension S: medication: Losartan 100MG  daily BP Readings from Last 3 Encounters:  12/30/20 138/86  12/22/20 (!) 150/108  11/05/20 129/88   A/P: Stable. Continue current medications.   # GERD S:Medication: takes pantoprazole every morning 40 mg through Gi. Also having to take tums sparingly but in past 5 days has been worse.  -also needs to quit dipping  -he remains alcohol free thankfully A/P: poor control in recent days - trial twice a day for 5-7 days protonix and if not ipmproved needs to follow up with [place]. If has new symptoms such as shortness of breath,e xertional symptoms, needs further workup   #Pancreatic pseudocyst-noted during hospitalization November 2021 and  plan was to consider 43-month repeat- he is scheduled for this on 01/22/2021 through GI  #Elevated ferritin level noted in the past-I am going to have team reach out to patient and have him come back for early morning labs to check CBC/CMP/ferritin panel  Recommended follow up: 3 month follow up  Future Appointments  Date Time Provider [place]  01/04/2021  2:00 PM GI-315 MR 3 GI-315MRI [facility] [facility]  01/14/2021  2:00 PM Donnal Moat T, PA-C CP-CP None  01/22/2021  9:00 AM WL-MR 1 WL-MRI [place]  04/01/2021  3:40 PM Marin Olp, MD [facility]    Lab/Order associations:   ICD-10-CM   1. Essential hypertension, benign  I10   2. Hyperglycemia  R73.9     Meds ordered this encounter  Medications  . HYDROcodone-acetaminophen (NORCO/VICODIN) 5-325 MG tablet    Sig: Take 1 tablet by mouth every 6 (six) hours as needed for moderate pain or severe pain. Do not take valium within 12 hours and do not drive for 8 hours after taking this medicine    Dispense:  20 tablet    Refill:  0    Time Spent: 44 minutes of total time (2:50 PM- 3:34 PM) was spent on the date of the encounter performing the following actions: chart review prior to seeing the patient, obtaining history, performing a medically necessary exam, counseling on the treatment plan as well as counseling patient about his level of distress and potential solutions (such as advising against  further knee injections until back evaluated further), placing orders, and documenting in our EHR.   Return precautions advised.  [name], MD

## 2020-12-29 NOTE — Patient Instructions (Addendum)
Keep MRI of the back. I would hold off on gel injections until you have more MRI information. Since Dr. Erlinda Hong seemed to be a better fit I would follow up with him for next steps over murphy/wainer. We can always get another opinion if needed- I still do not fully understand why you are having such severe pain.   Short term hydrocodone to help with flare in pain but this is not a great long term plan- try to use sparingly perhaps 2.5 mg if pain over 7/10.   Also increase protonix to twice a day for 5-7 days to try to calm down reflux.   Recommended follow up: Return in about 3 months (around 04/01/2021).  With how rough things havebene recently lets try 3 month follow up or sooner if needed. Continue to follow up with psychiatry as well- thanks for being consistent with those visits

## 2020-12-30 ENCOUNTER — Telehealth: Payer: Self-pay

## 2020-12-30 ENCOUNTER — Encounter: Payer: Self-pay | Admitting: Family Medicine

## 2020-12-30 ENCOUNTER — Other Ambulatory Visit: Payer: Self-pay

## 2020-12-30 ENCOUNTER — Ambulatory Visit: Payer: 59 | Admitting: Family Medicine

## 2020-12-30 VITALS — BP 138/86 | HR 102 | Temp 98.9°F | Ht 68.0 in | Wt 220.6 lb

## 2020-12-30 DIAGNOSIS — F3342 Major depressive disorder, recurrent, in full remission: Secondary | ICD-10-CM

## 2020-12-30 DIAGNOSIS — M25561 Pain in right knee: Secondary | ICD-10-CM | POA: Diagnosis not present

## 2020-12-30 DIAGNOSIS — R739 Hyperglycemia, unspecified: Secondary | ICD-10-CM

## 2020-12-30 DIAGNOSIS — F411 Generalized anxiety disorder: Secondary | ICD-10-CM

## 2020-12-30 DIAGNOSIS — I10 Essential (primary) hypertension: Secondary | ICD-10-CM | POA: Diagnosis not present

## 2020-12-30 DIAGNOSIS — R7989 Other specified abnormal findings of blood chemistry: Secondary | ICD-10-CM

## 2020-12-30 MED ORDER — HYDROCODONE-ACETAMINOPHEN 5-325 MG PO TABS: 1.0000 | ORAL_TABLET | Freq: Four times a day (QID) | ORAL | 0 refills | Status: DC | PRN

## 2020-12-30 NOTE — Telephone Encounter (Signed)
Called and spoke with the patient. He is aware that the pharmacy has his prescription. He gave a verbal understanding.

## 2020-12-30 NOTE — Telephone Encounter (Signed)
Pt called stating the pharmacy does not have the hydrocodone that Dr. Yong Channel sent in for him. Please advise.

## 2021-01-01 ENCOUNTER — Ambulatory Visit: Payer: 59 | Admitting: Family Medicine

## 2021-01-04 ENCOUNTER — Other Ambulatory Visit: Payer: Self-pay

## 2021-01-04 ENCOUNTER — Encounter: Payer: Self-pay | Admitting: Family Medicine

## 2021-01-04 ENCOUNTER — Telehealth: Payer: Self-pay | Admitting: Physician Assistant

## 2021-01-04 ENCOUNTER — Ambulatory Visit
Admission: RE | Admit: 2021-01-04 | Discharge: 2021-01-04 | Disposition: A | Discharge status: Home / Self Care | Payer: 59 | Source: Ambulatory Visit | Attending: Orthopaedic Surgery | Admitting: Orthopaedic Surgery

## 2021-01-04 ENCOUNTER — Other Ambulatory Visit (INDEPENDENT_AMBULATORY_CARE_PROVIDER_SITE_OTHER): Payer: 59

## 2021-01-04 DIAGNOSIS — M545 Low back pain, unspecified: Secondary | ICD-10-CM

## 2021-01-04 DIAGNOSIS — R7989 Other specified abnormal findings of blood chemistry: Secondary | ICD-10-CM

## 2021-01-04 DIAGNOSIS — I10 Essential (primary) hypertension: Secondary | ICD-10-CM | POA: Diagnosis not present

## 2021-01-04 DIAGNOSIS — G8929 Other chronic pain: Secondary | ICD-10-CM

## 2021-01-04 LAB — CBC WITH DIFFERENTIAL/PLATELET
Basophils Absolute: 0 10*3/uL (ref 0.0–0.1)
Basophils Relative: 0.4 % (ref 0.0–3.0)
Eosinophils Absolute: 0.2 10*3/uL (ref 0.0–0.7)
Eosinophils Relative: 1.4 % (ref 0.0–5.0)
HCT: 45.2 % (ref 39.0–52.0)
Hemoglobin: 15.6 g/dL (ref 13.0–17.0)
Lymphocytes Relative: 30.6 % (ref 12.0–46.0)
Lymphs Abs: 3.3 10*3/uL (ref 0.7–4.0)
MCHC: 34.5 g/dL (ref 30.0–36.0)
MCV: 85 fl (ref 78.0–100.0)
Monocytes Absolute: 0.6 10*3/uL (ref 0.1–1.0)
Monocytes Relative: 5.4 % (ref 3.0–12.0)
Neutro Abs: 6.7 10*3/uL (ref 1.4–7.7)
Neutrophils Relative %: 62.2 % (ref 43.0–77.0)
Platelets: 284 10*3/uL (ref 150.0–400.0)
RBC: 5.32 Mil/uL (ref 4.22–5.81)
RDW: 14.7 % (ref 11.5–15.5)
WBC: 10.8 10*3/uL — ABNORMAL HIGH (ref 4.0–10.5)

## 2021-01-04 LAB — IBC + FERRITIN
Ferritin: 217.2 ng/mL (ref 22.0–322.0)
Iron: 89 ug/dL (ref 42–165)
Saturation Ratios: 20.8 % (ref 20.0–50.0)
Transferrin: 305 mg/dL (ref 212.0–360.0)

## 2021-01-04 LAB — COMPREHENSIVE METABOLIC PANEL
ALT: 20 U/L (ref 0–53)
AST: 17 U/L (ref 0–37)
Albumin: 4.3 g/dL (ref 3.5–5.2)
Alkaline Phosphatase: 70 U/L (ref 39–117)
BUN: 18 mg/dL (ref 6–23)
CO2: 29 mEq/L (ref 19–32)
Calcium: 9 mg/dL (ref 8.4–10.5)
Chloride: 100 mEq/L (ref 96–112)
Creatinine, Ser: 1.17 mg/dL (ref 0.40–1.50)
GFR: 78.48 mL/min (ref >60.00)
Glucose, Bld: 145 mg/dL — ABNORMAL HIGH (ref 70–99)
Potassium: 3.7 mEq/L (ref 3.5–5.1)
Sodium: 140 mEq/L (ref 135–145)
Total Bilirubin: 0.4 mg/dL (ref 0.2–1.2)
Total Protein: 7.1 g/dL (ref 6.0–8.3)

## 2021-01-04 MED ORDER — METHYLPHENIDATE HCL 10 MG PO TABS: 10.0000 mg | ORAL_TABLET | Freq: Three times a day (TID) | ORAL | 0 refills | Status: DC

## 2021-01-04 NOTE — Telephone Encounter (Signed)
L;ast filled 12/07/20 pended

## 2021-01-04 NOTE — Telephone Encounter (Signed)
Next visit is 01/14/21. Requesting refill on Methylphenidate 10 mg called in to:  [facility] [place] [address] Phone:  [phone]  Fax:  [phone]

## 2021-01-05 ENCOUNTER — Encounter: Payer: Self-pay | Admitting: Orthopaedic Surgery

## 2021-01-05 ENCOUNTER — Encounter: Payer: Self-pay | Admitting: Family Medicine

## 2021-01-05 ENCOUNTER — Other Ambulatory Visit: Payer: Self-pay

## 2021-01-05 ENCOUNTER — Telehealth: Payer: Self-pay | Admitting: Physician Assistant

## 2021-01-05 ENCOUNTER — Other Ambulatory Visit: Payer: Self-pay | Admitting: Family Medicine

## 2021-01-05 DIAGNOSIS — D72829 Elevated white blood cell count, unspecified: Secondary | ICD-10-CM

## 2021-01-05 NOTE — Telephone Encounter (Signed)
Pt called wanting to know if Dr.Cory should still be refilling his pain rx but he would like a CB to let him know because he needs a refill of the oxycodone 5 mg and he wanted to let the Dr know the tramadol hasn't been helping at all.   [phone]

## 2021-01-05 NOTE — Telephone Encounter (Signed)
Pt called reporting  [facility] [address] will not get Rx in for 2 weeks. Methylphenidate 10 mg (Ritalin). Pharmacy asking for a sub or send to another pharmacy Pt is ok with either. Out of meds today. Pt # [phone] advise which can be done.

## 2021-01-05 NOTE — Telephone Encounter (Signed)
Patient called back following up on this request. He said that he did not notice much relief from tramadol or hydrocodone.

## 2021-01-06 ENCOUNTER — Other Ambulatory Visit: Payer: Self-pay

## 2021-01-06 ENCOUNTER — Telehealth: Payer: Self-pay | Admitting: Physician Assistant

## 2021-01-06 ENCOUNTER — Other Ambulatory Visit: Payer: Self-pay | Admitting: Psychiatry

## 2021-01-06 ENCOUNTER — Encounter: Payer: Self-pay | Admitting: Family Medicine

## 2021-01-06 MED ORDER — METHYLPHENIDATE HCL 10 MG PO TABS: 10.0000 mg | ORAL_TABLET | Freq: Three times a day (TID) | ORAL | 0 refills | Status: DC

## 2021-01-06 MED ORDER — HYDROCODONE-ACETAMINOPHEN 5-325 MG PO TABS: 1.0000 | ORAL_TABLET | Freq: Four times a day (QID) | ORAL | 0 refills | Status: DC | PRN

## 2021-01-06 NOTE — Telephone Encounter (Signed)
Is there anything we can do ?

## 2021-01-06 NOTE — Telephone Encounter (Signed)
Pt will find a pharmacy and call us back.

## 2021-01-06 NOTE — Telephone Encounter (Signed)
Tell him to pick a different pharmacy chain and we will send in a prescription for Ritalin at a different pharmacy chain.  I do not want to switch medicines.  The other option is he could pick an independent pharmacy.  It is likely available at other locations.

## 2021-01-06 NOTE — Telephone Encounter (Signed)
I would also recommend patient be referred to pain management if his orthopedic surgeon does not see a clear source of pain- I am not fully understanding what is causing pain. Alternatively could refer to a different orthopedist (perhaps [facility] or [facility] or [facility])? Per his preference

## 2021-01-06 NOTE — Telephone Encounter (Signed)
Glenn [name] called back in today about his medications. States that he is out and his pharmacy is out of stock. He would like something similar to Ritalin sent in to pharmacy. Ph [phone]

## 2021-01-06 NOTE — Telephone Encounter (Signed)
Pt called and said that he [facility] [address] because [facility]'t [facility] it in [facility]. Please send in today he is out

## 2021-01-06 NOTE — Telephone Encounter (Signed)
Pended please send

## 2021-01-06 NOTE — Telephone Encounter (Signed)
I would recommend that he ask Dr. Georgina Snell for refills. Thanks.

## 2021-01-12 NOTE — Telephone Encounter (Signed)
Please check with him about the pharmacy.  Did his current pharmacy get in the Ritalin or does he need it sent elsewhere?  Thanks.

## 2021-01-12 NOTE — Telephone Encounter (Signed)
Pt said he was able to pick up Rx

## 2021-01-13 ENCOUNTER — Other Ambulatory Visit: Payer: Self-pay | Admitting: Family Medicine

## 2021-01-13 ENCOUNTER — Other Ambulatory Visit: Payer: Self-pay

## 2021-01-13 ENCOUNTER — Encounter: Payer: Self-pay | Admitting: Family Medicine

## 2021-01-13 MED ORDER — HYDROCODONE-ACETAMINOPHEN 5-325 MG PO TABS: 1.0000 | ORAL_TABLET | Freq: Four times a day (QID) | ORAL | 0 refills | Status: DC | PRN

## 2021-01-13 NOTE — Telephone Encounter (Signed)
Pt requesting refill for Hydrocodone. Last OV 12/30/2020.

## 2021-01-13 NOTE — Telephone Encounter (Signed)
Pt called following up on this prescription. Pt states he has been taking the hydrocodone 3 times a day and has now ran out. Please advise.

## 2021-01-13 NOTE — Telephone Encounter (Signed)
I thought this was a duplicate however I was wrong, pt is taking this TID and is running out.

## 2021-01-13 NOTE — Telephone Encounter (Signed)
Noted  

## 2021-01-13 NOTE — Telephone Encounter (Signed)
FYI only for Dr. Erlinda Hong and Dr. Georgina Snell  Chronic pain medication is not a good long term solution for patient. Nor is him reaching out to 3 different providers (Dr. Erlinda Hong, Dr. Georgina Snell, and me) for pain medicine. I will be the point of contact for pain medication BUT I need patient to move forward with Dr. Phoebe Sharps last suggestion "That's not a good sign that you only had temporary relief from the prior injection. It may mean that surgery is indicated. However, if you're not quite ready for a surgical consultation you still have the option of trying another injection to see if it'll work better this time around. Let me know what you would like to do. Thank you. "  [facility] team please call patient and encourage him to set a follow up visit with Dr. Erlinda Hong. He should not ask anyone but me for pain medication and we need to move quickly towards a solution. If he is not finding relief with injections then need to consider surgical options. I will not prescribe long term chronic pain medication for him but am willing to help while we are working towards a solution. If he prefers chronic pain management and not pursuing further relief with injections/surgery then we need to refer to pain management.

## 2021-01-13 NOTE — Telephone Encounter (Signed)
This is a duplicate, was just filled on 05/11. Tried refusing it but it says I dont have the security to refuse it.

## 2021-01-13 NOTE — Telephone Encounter (Signed)
Patient called today stating he sent a message to Dr. Georgina Snell about his Hydrocodone. That Dr. Yong Channel wants Dr. Georgina Snell to continue prescribing that medication. Patient states that he is almost out of the medication and didn't want to run out. [facility] [address]

## 2021-01-14 ENCOUNTER — Ambulatory Visit: Payer: 59 | Admitting: Physician Assistant

## 2021-01-14 ENCOUNTER — Encounter: Payer: Self-pay | Admitting: Family Medicine

## 2021-01-17 ENCOUNTER — Encounter: Payer: Self-pay | Admitting: Family Medicine

## 2021-01-19 ENCOUNTER — Telehealth: Payer: 59 | Admitting: Family Medicine

## 2021-01-19 DIAGNOSIS — U071 COVID-19: Secondary | ICD-10-CM

## 2021-01-19 MED ORDER — BENZONATATE 100 MG PO CAPS: 100.0000 mg | ORAL_CAPSULE | Freq: Three times a day (TID) | ORAL | 0 refills | Status: DC | PRN
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
## 2021-01-19 NOTE — Patient Instructions (Addendum)
   ---------------------------------------------------------------------------------------------------------------------------      WORK SLIP:  Patient Glenn [name],  1980/11/16, was seen for a medical visit today, 01/19/21 . Please excuse from work for a COVID like illness. We advise 10 days minimum from the onset of symptoms (01/14/21) PLUS 1 day of no fever and improved symptoms. Will defer to employer for a sooner return to work if symptoms have resolved, it is greater than 5 days since the positive test and the patient can wear a high-quality, tight fitting mask such as N95 or KN95 at all times for an additional 5 days. Would also suggest COVID19 antigen testing is negative prior to return.  Sincerely: E-signature: Dr. Colin Benton, DO [facility] [place] Ph: [phone]   ------------------------------------------------------------------------------------------------------------------------------   HOME CARE TIPS:  -I sent the medication(s) we discussed to your pharmacy: Meds ordered this encounter  Medications  . benzonatate (TESSALON PERLES) 100 MG capsule    Sig: Take 1 capsule (100 mg total) by mouth 3 (three) times daily as needed.    Dispense:  20 capsule    Refill:  0     -can use nasal saline a few times per day if you have nasal congestion; sometimes  a short course of Afrin nasal spray for 3 days can help with symptoms as well  -stay hydrated, drink plenty of fluids and eat small healthy meals - avoid dairy  -follow up with your doctor in 2-3 days unless improving and feeling better  -stay home while sick, except to seek medical care. If you have COVID19, ideally it would be best to stay home for a full 10 days since the onset of symptoms PLUS one day of no fever and feeling better. Wear a good mask that fits snugly (such as N95 or KN95) if around others to reduce the risk of transmission.  It was nice to meet you today, and I really hope you are  feeling better soon. I help [facility] out with telemedicine visits on Tuesdays and Thursdays and am available for visits on those days. If you have any concerns or questions following this visit please schedule a follow up visit with your Primary Care doctor or seek care at a local urgent care clinic to avoid delays in care.    Seek in person care or schedule a follow up video visit promptly if your symptoms worsen, new concerns arise or you are not improving with treatment. Call 911 and/or seek emergency care if your symptoms are severe or life threatening.

## 2021-01-21 ENCOUNTER — Other Ambulatory Visit: Payer: Self-pay | Admitting: Family Medicine

## 2021-01-21 MED ORDER — HYDROCODONE-ACETAMINOPHEN 5-325 MG PO TABS: 1.0000 | ORAL_TABLET | Freq: Four times a day (QID) | ORAL | 0 refills | Status: DC | PRN

## 2021-01-22 ENCOUNTER — Ambulatory Visit (HOSPITAL_COMMUNITY): Payer: 59

## 2021-01-22 ENCOUNTER — Ambulatory Visit: Payer: 59 | Admitting: Orthopaedic Surgery

## 2021-01-27 ENCOUNTER — Other Ambulatory Visit: Payer: Self-pay | Admitting: Family Medicine

## 2021-01-27 NOTE — Telephone Encounter (Signed)
Team before we refill this need to make sure he has follow-up scheduled with orthopedics for neck steps.  Appears his appointment on 527 got canceled due to COVID-would make sure to go ahead and reschedule next visit outside of potential self-isolation timeframe-then send this back to me and I will review refill

## 2021-01-29 ENCOUNTER — Encounter: Payer: Self-pay | Admitting: Family Medicine

## 2021-01-29 ENCOUNTER — Encounter: Payer: Self-pay | Admitting: Orthopaedic Surgery

## 2021-01-29 ENCOUNTER — Ambulatory Visit (HOSPITAL_COMMUNITY): Payer: 59

## 2021-01-29 ENCOUNTER — Other Ambulatory Visit: Payer: Self-pay | Admitting: Family Medicine

## 2021-01-29 MED ORDER — HYDROCODONE-ACETAMINOPHEN 5-325 MG PO TABS: 1.0000 | ORAL_TABLET | Freq: Four times a day (QID) | ORAL | 0 refills | Status: DC | PRN

## 2021-01-29 NOTE — Telephone Encounter (Signed)
Patient called in and stated he has appt on 6/17 with Dr. Erlinda Hong. Patient would like this to get refilled before the weekend.

## 2021-01-29 NOTE — Telephone Encounter (Signed)
Refill on separate refill request

## 2021-02-04 ENCOUNTER — Other Ambulatory Visit: Payer: Self-pay | Admitting: Family Medicine

## 2021-02-05 ENCOUNTER — Ambulatory Visit (HOSPITAL_COMMUNITY): Payer: 59

## 2021-02-05 MED ORDER — HYDROCODONE-ACETAMINOPHEN 5-325 MG PO TABS: 1.0000 | ORAL_TABLET | Freq: Four times a day (QID) | ORAL | 0 refills | Status: DC | PRN

## 2021-02-09 ENCOUNTER — Ambulatory Visit: Payer: 59 | Admitting: Physician Assistant

## 2021-02-12 ENCOUNTER — Encounter: Payer: Self-pay | Admitting: Orthopaedic Surgery

## 2021-02-12 ENCOUNTER — Other Ambulatory Visit: Payer: Self-pay | Admitting: Family Medicine

## 2021-02-12 ENCOUNTER — Ambulatory Visit: Payer: 59 | Admitting: Orthopaedic Surgery

## 2021-02-12 VITALS — Ht 68.0 in | Wt 220.0 lb

## 2021-02-12 DIAGNOSIS — G8929 Other chronic pain: Secondary | ICD-10-CM

## 2021-02-12 DIAGNOSIS — M545 Low back pain, unspecified: Secondary | ICD-10-CM | POA: Diagnosis not present

## 2021-02-12 MED ORDER — PREDNISONE 10 MG (21) PO TBPK: ORAL_TABLET | ORAL | 0 refills | Status: DC

## 2021-02-12 MED ORDER — METHOCARBAMOL 500 MG PO TABS: 500.0000 mg | ORAL_TABLET | Freq: Two times a day (BID) | ORAL | 0 refills | Status: DC | PRN
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
## 2021-02-14 MED ORDER — HYDROCODONE-ACETAMINOPHEN 5-325 MG PO TABS: 1.0000 | ORAL_TABLET | Freq: Four times a day (QID) | ORAL | 0 refills | Status: DC | PRN

## 2021-02-19 ENCOUNTER — Other Ambulatory Visit: Payer: Self-pay | Admitting: Family Medicine

## 2021-02-21 NOTE — Telephone Encounter (Signed)
Patient picked up last prescription on 6/21 then appears he requested on 6/24- team he needs to make this medication last at least a week. Even today it has only been 5 days. If his pain is increasing he needs to follow up with orthopedics to be reassessed. I know he has an appointment with Dr. Ernestina Patches next month but if pain is worse needs to be evaluated.   Otherwise you may resend this to me on 02/23/21. We need to space out rx at least a week if not longer

## 2021-02-22 ENCOUNTER — Encounter: Payer: Self-pay | Admitting: Family Medicine

## 2021-02-22 NOTE — Telephone Encounter (Signed)
LM for pt tcb. 

## 2021-02-23 ENCOUNTER — Other Ambulatory Visit: Payer: Self-pay | Admitting: Family Medicine

## 2021-02-23 MED ORDER — HYDROCODONE-ACETAMINOPHEN 5-325 MG PO TABS: 1.0000 | ORAL_TABLET | Freq: Four times a day (QID) | ORAL | 0 refills | Status: DC | PRN

## 2021-02-23 NOTE — Telephone Encounter (Signed)
I refilled as part of my chart message

## 2021-02-24 ENCOUNTER — Encounter: Payer: Self-pay | Admitting: Family Medicine

## 2021-02-24 DIAGNOSIS — K863 Pseudocyst of pancreas: Secondary | ICD-10-CM

## 2021-02-26 ENCOUNTER — Ambulatory Visit: Payer: 59 | Admitting: Physician Assistant

## 2021-03-02 ENCOUNTER — Telehealth: Payer: Self-pay | Admitting: Physician Assistant

## 2021-03-02 ENCOUNTER — Other Ambulatory Visit: Payer: Self-pay | Admitting: Family Medicine

## 2021-03-02 ENCOUNTER — Other Ambulatory Visit: Payer: Self-pay

## 2021-03-02 MED ORDER — METHYLPHENIDATE HCL 10 MG PO TABS: 10.0000 mg | ORAL_TABLET | Freq: Three times a day (TID) | ORAL | 0 refills | Status: DC

## 2021-03-02 NOTE — Telephone Encounter (Signed)
Pended.

## 2021-03-02 NOTE — Telephone Encounter (Signed)
Pt would like a refill on ritalin 10mg . Please send to [facility] on [address]

## 2021-03-03 ENCOUNTER — Other Ambulatory Visit: Payer: Self-pay | Admitting: Family Medicine

## 2021-03-03 MED ORDER — HYDROCODONE-ACETAMINOPHEN 5-325 MG PO TABS: 1.0000 | ORAL_TABLET | Freq: Four times a day (QID) | ORAL | 0 refills | Status: DC | PRN

## 2021-03-05 NOTE — Telephone Encounter (Signed)
Patient notified of recommendations via my chart. CT scan order and reminder in epic. MRI order cancelled.

## 2021-03-11 ENCOUNTER — Ambulatory Visit: Payer: 59 | Admitting: Physician Assistant

## 2021-03-11 ENCOUNTER — Ambulatory Visit (INDEPENDENT_AMBULATORY_CARE_PROVIDER_SITE_OTHER): Payer: 59 | Admitting: Physical Medicine and Rehabilitation

## 2021-03-11 ENCOUNTER — Other Ambulatory Visit: Payer: Self-pay | Admitting: Family Medicine

## 2021-03-11 ENCOUNTER — Ambulatory Visit: Payer: Self-pay

## 2021-03-11 ENCOUNTER — Encounter: Payer: Self-pay | Admitting: Physical Medicine and Rehabilitation

## 2021-03-11 ENCOUNTER — Other Ambulatory Visit: Payer: Self-pay

## 2021-03-11 VITALS — BP 138/103 | HR 101

## 2021-03-11 DIAGNOSIS — M5416 Radiculopathy, lumbar region: Secondary | ICD-10-CM | POA: Diagnosis not present

## 2021-03-11 MED ORDER — METHYLPREDNISOLONE ACETATE 80 MG/ML IJ SUSP
80.0000 mg | Freq: Once | INTRAMUSCULAR | Status: AC
  Administered 2021-03-11: 80 mg

## 2021-03-11 MED ORDER — HYDROCODONE-ACETAMINOPHEN 5-325 MG PO TABS: 1.0000 | ORAL_TABLET | Freq: Four times a day (QID) | ORAL | 0 refills | Status: DC | PRN

## 2021-03-11 NOTE — Progress Notes (Signed)
Pt state lower back pain that travels to both legs. Pt state everything makes the pain worse. Pt state it hard to sleep at night. Pt state he takes pain meds and gel ack pain pads to help ease his pain.  Numeric Pain Rating Scale and Functional Assessment Average Pain 6   In the last MONTH (on 0-10 scale) has pain interfered with the following?  1. General activity like being  able to carry out your everyday physical activities such as walking, climbing stairs, carrying groceries, or moving a chair?  Rating(9)   +Driver, -BT, -Dye Allergies.

## 2021-03-11 NOTE — Patient Instructions (Signed)

## 2021-03-11 NOTE — Progress Notes (Signed)
Glenn [name] - 40 y.o. male MRN [id number]  Date of birth: 22-Oct-1980  Office Visit Note: Visit Date: 03/11/2021 PCP: Marin Olp, MD Referred by: Marin Olp, MD  Subjective: Chief Complaint  Patient presents with   Lower Back - Pain   Left Leg - Pain   Right Leg - Pain   HPI:  Glenn [name] is a 40 y.o. male who comes in today For planned L4 transforaminal epidural steroid injection.  He comes in today at the request of Dr. Eduard Roux.  Patient has failed conservative care including therapy and medication management.  He continues to take some hydrocodone.  MRI reviewed with him today shows left foraminal extraforaminal protrusion of L4 and L5 with some annular tear.  Nothing really on the right.  He is having Bilateral symptoms into the thighs and lower back. Not on report but has transitional S1 segment based on MRI numbering.  Given that his symptoms are Bilateral we will complete a Bilateral injection today.  Again MRI a little bit deceptive in terms of anatomy it should not really cause symptoms on the right.  He may be having underlying myofascial pain as well.  He does report more recent pain even up as high as the mid back.  Continue with conservative care plan as well.  ROS Otherwise per HPI.  Assessment & Plan: Visit Diagnoses:    ICD-10-CM   1. Lumbar radiculopathy  M54.16 XR C-ARM NO REPORT    Epidural Steroid injection    methylPREDNISolone acetate (DEPO-MEDROL) injection 80 mg      Plan: No additional findings.   Meds & Orders:  Meds ordered this encounter  Medications   methylPREDNISolone acetate (DEPO-MEDROL) injection 80 mg    Orders Placed This Encounter  Procedures   XR C-ARM NO REPORT   Epidural Steroid injection    Follow-up: Return for visit to requesting physician as needed.   Procedures: No procedures performed  Lumbosacral Transforaminal Epidural Steroid Injection - Sub-Pedicular Approach with Fluoroscopic Guidance  Patient: Glenn [name]      Date of Birth: 1981/02/06 MRN: [id number] PCP: Marin Olp, MD      Visit Date: 03/11/2021   Universal Protocol:    Date/Time: 03/11/2021  Consent Given By: the patient  Position: PRONE  Additional Comments: Vital signs were monitored before and after the procedure. Patient was prepped and draped in the usual sterile fashion. The correct patient, procedure, and site was verified.   Injection Procedure Details:   Procedure diagnoses: Lumbar radiculopathy [M54.16]    Meds Administered:  Meds ordered this encounter  Medications   methylPREDNISolone acetate (DEPO-MEDROL) injection 80 mg    Laterality: Bilateral  Location/Site:  L4-L5  Needle:5.0 in., 22 ga.  Short bevel or Quincke spinal needle  Needle Placement: Transforaminal  Findings:    -Comments: Excellent flow of contrast along the nerve, nerve root and into the epidural space.  Procedure Details: After squaring off the end-plates to get a true AP view, the C-arm was positioned so that an oblique view of the foramen as noted above was visualized. The target area is just inferior to the "nose of the scotty dog" or sub pedicular. The soft tissues overlying this structure were infiltrated with 2-3 ml. of 1% Lidocaine without Epinephrine.  The spinal needle was inserted toward the target using a "trajectory" view along the fluoroscope beam.  Under AP and lateral visualization, the needle was advanced so it did not puncture dura and  was located close the 6 O'Clock position of the pedical in AP tracterory. Biplanar projections were used to confirm position. Aspiration was confirmed to be negative for CSF and/or blood. A 1-2 ml. volume of Isovue-250 was injected and flow of contrast was noted at each level. Radiographs were obtained for documentation purposes.   After attaining the desired flow of contrast documented above, a 0.5 to 1.0 ml test dose of 0.25% Marcaine was injected into each respective  transforaminal space.  The patient was observed for 90 seconds post injection.  After no sensory deficits were reported, and normal lower extremity motor function was noted,   the above injectate was administered so that equal amounts of the injectate were placed at each foramen (level) into the transforaminal epidural space.   Additional Comments:  The patient tolerated the procedure well Dressing: 2 x 2 sterile gauze and Band-Aid    Post-procedure details: Patient was observed during the procedure. Post-procedure instructions were reviewed.  Patient left the clinic in stable condition.    Clinical History: MRI LUMBAR SPINE WITHOUT CONTRAST   TECHNIQUE: Multiplanar, multisequence MR imaging of the lumbar spine was performed. No intravenous contrast was administered.   COMPARISON:  Prior radiograph from 12/22/2020   FINDINGS: Segmentation: Standard. Lowest well-formed disc space labeled the L5-S1 level.   Alignment: Mild straightening of the normal lumbar lordosis. No listhesis.   Vertebrae: Vertebral body height well maintained without acute or chronic fracture. Bone marrow signal intensity within normal limits. No discrete or worrisome osseous lesions. No abnormal marrow edema.   Conus medullaris and cauda equina: Conus extends to the L1 level. Conus and cauda equina appear normal.   Paraspinal and other soft tissues: Paraspinous soft tissues within normal limits. Visualized visceral structures are normal.   Disc levels:   L1-2:  Unremarkable.   L2-3:  Unremarkable.   L3-4:  Unremarkable.   L4-5: Broad-based left foraminal to extraforaminal disc protrusion contacts the exiting left L4 nerve root as it courses of the left neural foramen (series 5, image 12). Superimposed mild facet spurring. No spinal stenosis. Mild left L4 foraminal narrowing. Right neural foramina remains patent.   L5-S1: Mild disc bulge. Superimposed left foraminal disc protrusion with  annular fissure, closely approximating and/or contacting the exiting left L5 nerve root (series 6, image 34). Minimal facet hypertrophy. No significant spinal stenosis. Mild left L5 foraminal narrowing. Right neural foramina remains patent.   IMPRESSION: 1. Left foraminal to extraforaminal disc protrusion at L4-5, contacting the exiting left L4 nerve root. 2. Left foraminal disc protrusion with annular fissure at L5-S1, closely approximating and potentially affecting the exiting left L5 nerve root.     Electronically Signed   By: Jeannine Boga M.D.   On: 01/04/2021 20:17     Objective:  VS:  HT:    WT:   BMI:     BP:(!) 138/103  HR:(!) 101bpm  TEMP: ( )  RESP:  Physical Exam Vitals and nursing note reviewed.  Constitutional:      General: He is not in acute distress.    Appearance: Normal appearance. He is not ill-appearing.  HENT:     Head: Normocephalic and atraumatic.     Right Ear: External ear normal.     Left Ear: External ear normal.     Nose: No congestion.  Eyes:     Extraocular Movements: Extraocular movements intact.  Cardiovascular:     Rate and Rhythm: Normal rate.     Pulses: Normal pulses.  Pulmonary:  Effort: Pulmonary effort is normal. No respiratory distress.  Abdominal:     General: There is no distension.     Palpations: Abdomen is soft.  Musculoskeletal:        General: No tenderness or signs of injury.     Cervical back: Neck supple.     Right lower leg: No edema.     Left lower leg: No edema.     Comments: Patient has good distal strength without clonus.  Skin:    Findings: No erythema or rash.  Neurological:     General: No focal deficit present.     Mental Status: He is alert and oriented to person, place, and time.     Sensory: No sensory deficit.     Motor: No weakness or abnormal muscle tone.     Coordination: Coordination normal.  Psychiatric:        Mood and Affect: Mood normal.        Behavior: Behavior normal.      Imaging: XR C-ARM NO REPORT  Result Date: 03/11/2021 Please see Notes tab for imaging impression.

## 2021-03-12 NOTE — Procedures (Signed)
Lumbosacral Transforaminal Epidural Steroid Injection - Sub-Pedicular Approach with Fluoroscopic Guidance  Patient: Glenn [name]      Date of Birth: 1980-11-25 MRN: [id number] PCP: Marin Olp, MD      Visit Date: 03/11/2021   Universal Protocol:    Date/Time: 03/11/2021  Consent Given By: the patient  Position: PRONE  Additional Comments: Vital signs were monitored before and after the procedure. Patient was prepped and draped in the usual sterile fashion. The correct patient, procedure, and site was verified.   Injection Procedure Details:   Procedure diagnoses: Lumbar radiculopathy [M54.16]    Meds Administered:  Meds ordered this encounter  Medications   methylPREDNISolone acetate (DEPO-MEDROL) injection 80 mg    Laterality: Bilateral  Location/Site:  L4-L5  Needle:5.0 in., 22 ga.  Short bevel or Quincke spinal needle  Needle Placement: Transforaminal  Findings:    -Comments: Excellent flow of contrast along the nerve, nerve root and into the epidural space.  Procedure Details: After squaring off the end-plates to get a true AP view, the C-arm was positioned so that an oblique view of the foramen as noted above was visualized. The target area is just inferior to the "nose of the scotty dog" or sub pedicular. The soft tissues overlying this structure were infiltrated with 2-3 ml. of 1% Lidocaine without Epinephrine.  The spinal needle was inserted toward the target using a "trajectory" view along the fluoroscope beam.  Under AP and lateral visualization, the needle was advanced so it did not puncture dura and was located close the 6 O'Clock position of the pedical in AP tracterory. Biplanar projections were used to confirm position. Aspiration was confirmed to be negative for CSF and/or blood. A 1-2 ml. volume of Isovue-250 was injected and flow of contrast was noted at each level. Radiographs were obtained for documentation purposes.   After attaining the  desired flow of contrast documented above, a 0.5 to 1.0 ml test dose of 0.25% Marcaine was injected into each respective transforaminal space.  The patient was observed for 90 seconds post injection.  After no sensory deficits were reported, and normal lower extremity motor function was noted,   the above injectate was administered so that equal amounts of the injectate were placed at each foramen (level) into the transforaminal epidural space.   Additional Comments:  The patient tolerated the procedure well Dressing: 2 x 2 sterile gauze and Band-Aid    Post-procedure details: Patient was observed during the procedure. Post-procedure instructions were reviewed.  Patient left the clinic in stable condition.

## 2021-03-15 ENCOUNTER — Encounter: Payer: Self-pay | Admitting: Family Medicine

## 2021-03-19 ENCOUNTER — Other Ambulatory Visit: Payer: Self-pay | Admitting: Family Medicine

## 2021-03-19 NOTE — Telephone Encounter (Signed)
LAST APPOINTMENT DATE: 03/11/2021   NEXT APPOINTMENT DATE: 04/01/2021    LAST REFILL: 03/11/2021  QTY: 20

## 2021-03-21 MED ORDER — HYDROCODONE-ACETAMINOPHEN 5-325 MG PO TABS: 1.0000 | ORAL_TABLET | Freq: Four times a day (QID) | ORAL | 0 refills | Status: DC | PRN

## 2021-03-29 ENCOUNTER — Other Ambulatory Visit: Payer: Self-pay | Admitting: Family Medicine

## 2021-03-29 NOTE — Telephone Encounter (Signed)
Team can you get a status update- if not making any improvement in pain we need him to follow up with orthopedics for potential next steps. If they do not have further info we need to look at pain management. No evidence so far that he is weaning off meds at all- we need him to start spacing out- if I refill this needs to last at minimum 10-14 days

## 2021-03-30 ENCOUNTER — Telehealth: Payer: Self-pay

## 2021-03-30 MED ORDER — HYDROCODONE-ACETAMINOPHEN 5-325 MG PO TABS: 1.0000 | ORAL_TABLET | Freq: Four times a day (QID) | ORAL | 0 refills | Status: DC | PRN

## 2021-03-30 NOTE — Telephone Encounter (Signed)
Pt has an appointment in office 04/01/2021.

## 2021-03-30 NOTE — Telephone Encounter (Signed)
Patient stated that he was returning a call from [place]

## 2021-04-01 ENCOUNTER — Other Ambulatory Visit: Payer: Self-pay

## 2021-04-01 ENCOUNTER — Encounter: Payer: Self-pay | Admitting: Family Medicine

## 2021-04-01 ENCOUNTER — Ambulatory Visit: Payer: 59 | Admitting: Family Medicine

## 2021-04-01 VITALS — BP 137/88 | HR 108 | Temp 98.0°F | Ht 68.0 in | Wt 221.2 lb

## 2021-04-01 DIAGNOSIS — K219 Gastro-esophageal reflux disease without esophagitis: Secondary | ICD-10-CM

## 2021-04-01 DIAGNOSIS — M5442 Lumbago with sciatica, left side: Secondary | ICD-10-CM

## 2021-04-01 DIAGNOSIS — M25561 Pain in right knee: Secondary | ICD-10-CM | POA: Diagnosis not present

## 2021-04-01 DIAGNOSIS — I10 Essential (primary) hypertension: Secondary | ICD-10-CM | POA: Diagnosis not present

## 2021-04-01 DIAGNOSIS — G8929 Other chronic pain: Secondary | ICD-10-CM

## 2021-04-01 DIAGNOSIS — Z79899 Other long term (current) drug therapy: Secondary | ICD-10-CM

## 2021-04-01 DIAGNOSIS — M5441 Lumbago with sciatica, right side: Secondary | ICD-10-CM

## 2021-04-01 NOTE — Progress Notes (Signed)
Phone [phone] In person visit   Subjective:   Glenn [name] is a 40 y.o. year old very pleasant male patient who presents for/with See problem oriented charting Chief Complaint  Patient presents with   Hypertension   Knee Pain    This visit occurred during the SARS-CoV-2 public health emergency.  Safety protocols were in place, including screening questions prior to the visit, additional usage of staff PPE, and extensive cleaning of exam room while observing appropriate contact time as indicated for disinfecting solutions.   Past Medical History-  Patient Active Problem List   Diagnosis Date Noted   Pancreatic pseudocyst 08/05/2020    Priority: High   Acute hepatitis 07/27/2020    Priority: High   Hepatic steatosis 02/11/2018    Priority: High   Alcohol use disorder, moderate, in controlled environment ([place]) 02/11/2018    Priority: High   Rosacea 10/13/2020    Priority: Medium   Chronic fatigue 10/28/2018    Priority: Medium   GAD (generalized anxiety disorder) 07/02/2018    Priority: Medium   Major depression in full remission ([place]) 07/02/2018    Priority: Medium   Tobacco abuse 02/24/2015    Priority: Medium   Essential hypertension, benign 02/18/2014    Priority: Medium   Panic disorder 12/10/2013    Priority: Medium   Hyperglycemia 02/11/2018    Priority: Low   Pancreatitis, alcoholic, acute [id number]    Priority: Low   Baker's cyst of knee, right 10/27/2020   Patellofemoral pain syndrome of right knee 10/27/2020   Elevated ferritin 08/05/2020   Abdominal pain 07/27/2020   Mood disorder with depressive features due to general medical condition 10/28/2018   Acute pain of right knee 05/14/2018   Elevated uric acid in blood 03/16/2018    Medications- reviewed and updated Current Outpatient Medications  Medication Sig Dispense Refill   diazepam (VALIUM) 10 MG tablet Take 1 tablet (10 mg total) by mouth 2 (two) times daily as needed for anxiety (panic). 60  tablet 5   fluvoxaMINE (LUVOX) 100 MG tablet TAKE 1 TABLET(100 MG) BY MOUTH AT BEDTIME (Patient taking differently: Take 100 mg by mouth at bedtime.) 30 tablet 5   FOLIC ACID PO Take 1 tablet by mouth daily.     HYDROcodone-acetaminophen (NORCO/VICODIN) 5-325 MG tablet Take 1 tablet by mouth every 6 (six) hours as needed for moderate pain or severe pain (try to stretch medication out- use half tablet if needed). Do not take valium within 12 hours and do not drive for 8 hours after taking this medicine 20 tablet 0   losartan (COZAAR) 100 MG tablet TAKE 1 TABLET(100 MG) BY MOUTH DAILY (Patient taking differently: Take 100 mg by mouth daily.) 90 tablet 3   methylphenidate (RITALIN) 10 MG tablet Take 1 tablet (10 mg total) by mouth 3 (three) times daily with meals. 90 tablet 0   OVER THE COUNTER MEDICATION Take 6 capsules by mouth See admin instructions. Pt takes fruit and vegetable supplement. 3 fruit capsules and 3 vegetable capsules once daily     OVER THE COUNTER MEDICATION Take 1 Scoop by mouth daily. Super beets supplement powder. / 15 ml     pantoprazole (PROTONIX) 40 MG tablet Take by mouth.     traZODone (DESYREL) 100 MG tablet TAKE 1 TABLET(100 MG) BY MOUTH AT BEDTIME AS NEEDED FOR SLEEP 30 tablet 5   No current facility-administered medications for this visit.     Objective:  BP 137/88   Pulse (!) 108  Temp 98 F (36.7 C) (Temporal)   Ht '5\' 8"'$  (1.727 m)   Wt 221 lb 3.2 oz (100.3 kg)   SpO2 97%   BMI 33.63 kg/m  Gen: NAD, resting comfortably CV: RRR no murmurs rubs or gallops- not tachycardic on exam Lungs: CTAB no crackles, wheeze, rhonchi Abdomen: soft/nontender/nondistended/normal bowel sounds. No rebound or guarding.  Ext: no edema Skin: warm, dry MSK: Paraspinous muscles tender to palpation-no midline pain in low back.  5- out of 5 muscle strength in bilateral lower extremities    Assessment and Plan  # Prior Right Knee Pain- now more constellation of low back pain with  bilateral sciatica S:patient is experiencing pain in low back, upper back (tore a muscle doing something- picking up something heavy) , pain into both legs. Pain in the low back is the worst of his pain 6/10. Upper back was worse but now improving at 6/10- was 9/10. Pain in legs 6/10 on right and 5/10 on left- had injections in both sides but has not seen relief from that (3 weeks ago today). Sensation of mild weakness in legs- feels better with massage -symptoms in total in regards to right knee date back to December 2021 for hepatitis -right knee is better- can flare at times but not primary source of pain at this point  Taking 3 Hydrocodone-acetaminophen 5-325 mg once every 6 hours as needed- usually 3 a day. We discussed trying perhaps half tablet every 6 hours.   Has seen Dr. Georgina Snell in past then Dr. Erlinda Hong and most recently Dr. Ernestina Patches for injections. Plans to call Dr. Erlinda Hong back about upper back and follow up for 2nd round of injections potentially with Dr. Ernestina Patches  From prior note  "- There was also concern on 12/22/20 visit about L4 radiculopathy- started on prednisone and gabapentin- had some mild relief with the prednisone. He did not tolerate the gabapentin- had a panic attack but finished prednisone- pain in anterior shin did improved though hard to say due to level of pain." A/P: Pain pattern has shifted more towards low back and bilateral sciatica-we discussed trying to space hydrocodone some now to perhaps half tablet 3 times a day instead of full tablet.  Follow-up with Dr. Ernestina Patches for next set of injections since he has not seen much relief at this point.  Follow-up with Dr. Erlinda Hong about upper back pain. -Discussed if not making progress by 3 months we may need to look at pain management-would like to at minimum space narcotics out to 10 to 14 days if not longer -Should not drive or operate heavy machinery for 8 hours after hydrocodone use  # alcohol dependence S:Patient was able to stop drinking  due to motivation of staying out of hospital Lab Results  Component Value Date   ALT 20 01/04/2021   AST 17 01/04/2021   ALKPHOS 70 01/04/2021   BILITOT 0.4 01/04/2021  A/P: Thankfully patient has been alcohol free since December other than 1-2 drinks. Lfts better on last check but will check agian  #hypertension S: medication: Losartan 100 mg daily BP Readings from Last 3 Encounters:  04/01/21 137/88  03/11/21 (!) 138/103  12/30/20 138/86  A/P: Blood pressure high normal today but patient continues to deal with chronic pain-we will continue to monitor and continue current medicine for now  # GERD S:Medication: pantoprazole 40 mg in the AM as needed- 3 days a week A/P: doing well with sparing use- when pain in better situation consider trial 20 mg dose   #  insomnia S:medication; trazodone 100 mg as needed for sleep through psychiatry. Doing melatonin spray as well- neither really working A/P: poor control- encouraged follow up with Dr. Clovis Pu   # ADHD with psychiatry-  S:medication: Ritalin 10 mg 3x daily with meals.  A/P: discussed with ritalin, diazepam (rare through psychiatry- and spaces 12 hours at least from opiate) , hydrocodone- multiple high risk meds- need to try to wean opiates  #obesity-discussed my fitness pal, healthy weight and wellness program-he has had success in the past with the gym but this is limited due to his pain.  He also has had success with eating smaller more frequent meals up to 8 a day-I think this is a great choice for him-there is not a great short-term option for him  Recommended follow up: Return in about 3 months (around 07/02/2021) for follow-up or sooner if needed. Future Appointments  Date Time Provider [place]  04/23/2021 10:00 AM Donnal Moat T, PA-C CP-CP None   Lab/Order associations:   ICD-10-CM   1. Essential hypertension, benign  I10 CBC with Differential/Platelet    Comprehensive metabolic panel    2. Chronic pain of  right knee  M25.561    G89.29     3. Gastroesophageal reflux disease without esophagitis  K21.9     4. Chronic bilateral low back pain with bilateral sciatica  M54.42    M54.41    G89.29      I,Harris Phan,acting as a scribe for [name], MD.,have documented all relevant documentation on the behalf of [name], MD,as directed by  [name], MD while in the presence of [name], MD.  I, [name], MD, have reviewed all documentation for this visit. The documentation on 04/01/21 for the exam, diagnosis, procedures, and orders are all accurate and complete.   Return precautions advised.  [name], MD

## 2021-04-01 NOTE — Patient Instructions (Addendum)
Health Maintenance Due  Topic Date Due   INFLUENZA VACCINE  -Please consider getting your flu shot in the Fall. If you get this outside of our office, please let us know.  03/29/2021   Please stop by lab before you go If you have mychart- we will send your results within 3 business days of [place] receiving them.  If you do not have mychart- we will call you about results within 5 business days of [place] receiving them.  *please also note that you will see labs on mychart as soon as they post. I will later go in and write notes on them- will say "notes from Dr. Yong Channel"  Trial half tablet of hydrocodone for every 6 hours 3 times a day. Do not drive or operate heavy machinery after 8 hours of use. Please call me if you have new or worsening   I recommend that you call Dr. Erlinda Hong back and follow-up with Dr. Ernestina Patches for your second round of injections.  I like your idea of smaller portioned meals in order to lose weight- you can also try the Healthy Weight and Wellness program or the "MyFitnessPal" app as other options as well.  Recommended follow up: Return in about 3 months (around 07/02/2021) for follow-up or sooner if needed.

## 2021-04-02 ENCOUNTER — Encounter: Payer: Self-pay | Admitting: Orthopaedic Surgery

## 2021-04-02 LAB — CBC WITH DIFFERENTIAL/PLATELET
Basophils Absolute: 0.1 10*3/uL (ref 0.0–0.1)
Basophils Relative: 0.5 % (ref 0.0–3.0)
Eosinophils Absolute: 0.2 10*3/uL (ref 0.0–0.7)
Eosinophils Relative: 1.8 % (ref 0.0–5.0)
HCT: 47.1 % (ref 39.0–52.0)
Hemoglobin: 16 g/dL (ref 13.0–17.0)
Lymphocytes Relative: 33 % (ref 12.0–46.0)
Lymphs Abs: 3.6 10*3/uL (ref 0.7–4.0)
MCHC: 34 g/dL (ref 30.0–36.0)
MCV: 87.3 fl (ref 78.0–100.0)
Monocytes Absolute: 0.8 10*3/uL (ref 0.1–1.0)
Monocytes Relative: 7.3 % (ref 3.0–12.0)
Neutro Abs: 6.2 10*3/uL (ref 1.4–7.7)
Neutrophils Relative %: 57.4 % (ref 43.0–77.0)
Platelets: 288 10*3/uL (ref 150.0–400.0)
RBC: 5.39 Mil/uL (ref 4.22–5.81)
RDW: 13.2 % (ref 11.5–15.5)
WBC: 10.8 10*3/uL — ABNORMAL HIGH (ref 4.0–10.5)

## 2021-04-02 LAB — COMPREHENSIVE METABOLIC PANEL
ALT: 39 U/L (ref 0–53)
AST: 31 U/L (ref 0–37)
Albumin: 4.9 g/dL (ref 3.5–5.2)
Alkaline Phosphatase: 69 U/L (ref 39–117)
BUN: 28 mg/dL — ABNORMAL HIGH (ref 6–23)
CO2: 25 mEq/L (ref 19–32)
Calcium: 10 mg/dL (ref 8.4–10.5)
Chloride: 100 mEq/L (ref 96–112)
Creatinine, Ser: 1.55 mg/dL — ABNORMAL HIGH (ref 0.40–1.50)
GFR: 55.91 mL/min — ABNORMAL LOW (ref >60.00)
Glucose, Bld: 79 mg/dL (ref 70–99)
Potassium: 4.4 mEq/L (ref 3.5–5.1)
Sodium: 137 mEq/L (ref 135–145)
Total Bilirubin: 0.4 mg/dL (ref 0.2–1.2)
Total Protein: 8.3 g/dL (ref 6.0–8.3)

## 2021-04-04 LAB — DRUG MONITORING, PANEL 8 WITH CONFIRMATION, URINE
6 Acetylmorphine: NEGATIVE ng/mL (ref <10)
Alcohol Metabolites: POSITIVE ng/mL — AB (ref <500)
Alphahydroxyalprazolam: NEGATIVE ng/mL (ref <25)
Alphahydroxymidazolam: NEGATIVE ng/mL (ref <50)
Alphahydroxytriazolam: NEGATIVE ng/mL (ref <50)
Aminoclonazepam: NEGATIVE ng/mL (ref <25)
Amphetamines: NEGATIVE ng/mL (ref <500)
Benzodiazepines: POSITIVE ng/mL — AB (ref <100)
Buprenorphine, Urine: NEGATIVE ng/mL (ref <5)
Cocaine Metabolite: NEGATIVE ng/mL (ref <150)
Creatinine: 220.9 mg/dL (ref >=20.0)
Ethyl Glucuronide (ETG): 1571 ng/mL — ABNORMAL HIGH (ref <500)
Ethyl Sulfate (ETS): 786 ng/mL — ABNORMAL HIGH (ref <100)
Hydroxyethylflurazepam: NEGATIVE ng/mL (ref <50)
Lorazepam: NEGATIVE ng/mL (ref <50)
MDMA: NEGATIVE ng/mL (ref <500)
Marijuana Metabolite: NEGATIVE ng/mL (ref <20)
Nordiazepam: 255 ng/mL — ABNORMAL HIGH (ref <50)
Opiates: NEGATIVE ng/mL (ref <100)
Oxazepam: 385 ng/mL — ABNORMAL HIGH (ref <50)
Oxidant: NEGATIVE ug/mL (ref <200)
Oxycodone: NEGATIVE ng/mL (ref <100)
Temazepam: 656 ng/mL — ABNORMAL HIGH (ref <50)
pH: 5.5 (ref 4.5–9.0)

## 2021-04-04 LAB — DM TEMPLATE

## 2021-04-06 ENCOUNTER — Encounter: Payer: Self-pay | Admitting: Family Medicine

## 2021-04-06 ENCOUNTER — Other Ambulatory Visit: Payer: Self-pay

## 2021-04-06 DIAGNOSIS — R7989 Other specified abnormal findings of blood chemistry: Secondary | ICD-10-CM

## 2021-04-07 ENCOUNTER — Other Ambulatory Visit: Payer: Self-pay

## 2021-04-07 DIAGNOSIS — M5442 Lumbago with sciatica, left side: Secondary | ICD-10-CM

## 2021-04-07 DIAGNOSIS — G8929 Other chronic pain: Secondary | ICD-10-CM | POA: Insufficient documentation

## 2021-04-07 NOTE — Telephone Encounter (Signed)
I spoke the pt to give message below. Pt voices understanding. Referral has been placed.

## 2021-04-08 ENCOUNTER — Telehealth: Payer: Self-pay

## 2021-04-08 ENCOUNTER — Telehealth: Payer: Self-pay | Admitting: Physical Medicine and Rehabilitation

## 2021-04-08 NOTE — Telephone Encounter (Signed)
Pt called stating he would like to get another lumbar inj and would like a CB to set that up please.   [phone]

## 2021-04-08 NOTE — Telephone Encounter (Signed)
Patient is calling in wondering if we have a copy of the FMLA paperwork, couldn't find anything under media. States his employer needing it.

## 2021-04-08 NOTE — Telephone Encounter (Signed)
See below

## 2021-04-09 NOTE — Telephone Encounter (Signed)
Patient reports no relief at all from bilateral L4 TF on 7/14. Please advise.

## 2021-04-09 NOTE — Telephone Encounter (Signed)
Left message #1

## 2021-04-13 NOTE — Telephone Encounter (Signed)
Left message #2

## 2021-04-15 ENCOUNTER — Telehealth: Payer: Self-pay | Admitting: Physical Medicine and Rehabilitation

## 2021-04-15 NOTE — Telephone Encounter (Signed)
See previous message

## 2021-04-15 NOTE — Telephone Encounter (Signed)
Pt called to set an appt for lumber injection. Pt phone number is [phone].

## 2021-04-15 NOTE — Telephone Encounter (Signed)
Is auth needed for L4-5 interlam? Scheduled for 9/8 with driver and no blood thinners.

## 2021-04-16 NOTE — Telephone Encounter (Signed)
Please schedule an appointment with the pt.  Thanks

## 2021-04-20 NOTE — Telephone Encounter (Signed)
Patient states that he did get everything figured out with his FMLA paperwork and he no longer needs a copy from [place].

## 2021-04-23 ENCOUNTER — Telehealth: Payer: Self-pay | Admitting: *Deleted

## 2021-04-23 ENCOUNTER — Ambulatory Visit (INDEPENDENT_AMBULATORY_CARE_PROVIDER_SITE_OTHER): Payer: 59 | Admitting: Physician Assistant

## 2021-04-23 ENCOUNTER — Other Ambulatory Visit: Payer: Self-pay

## 2021-04-23 ENCOUNTER — Encounter: Payer: Self-pay | Admitting: Physician Assistant

## 2021-04-23 VITALS — BP 158/109 | HR 82

## 2021-04-23 DIAGNOSIS — F411 Generalized anxiety disorder: Secondary | ICD-10-CM | POA: Diagnosis not present

## 2021-04-23 DIAGNOSIS — F410 Panic disorder [episodic paroxysmal anxiety] without agoraphobia: Secondary | ICD-10-CM | POA: Diagnosis not present

## 2021-04-23 DIAGNOSIS — F330 Major depressive disorder, recurrent, mild: Secondary | ICD-10-CM

## 2021-04-23 DIAGNOSIS — F0631 Mood disorder due to known physiological condition with depressive features: Secondary | ICD-10-CM

## 2021-04-23 DIAGNOSIS — F17200 Nicotine dependence, unspecified, uncomplicated: Secondary | ICD-10-CM | POA: Diagnosis not present

## 2021-04-23 MED ORDER — BUPROPION HCL ER (XL) 150 MG PO TB24: 150.0000 mg | ORAL_TABLET | Freq: Every day | ORAL | 1 refills | Status: DC

## 2021-04-23 MED ORDER — FLUVOXAMINE MALEATE 100 MG PO TABS: 150.0000 mg | ORAL_TABLET | Freq: Every day | ORAL | 5 refills | Status: DC

## 2021-04-23 NOTE — Telephone Encounter (Signed)
I had sent a message to [facility] team to reach out to GI- not sure if that has happened yet- see if they can get this set up

## 2021-04-23 NOTE — Progress Notes (Signed)
[facility] Med Check  Patient ID: Glenn [name],  MRN: [id number]  PCP: Marin Olp, MD  Date of Evaluation: 04/23/2021 Time spent:30 minutes  Chief Complaint:  Chief Complaint   Anxiety; Depression; Insomnia; Follow-up     HISTORY/CURRENT STATUS: HPI For routine med check.    He stopped the Ritalin about a month ago. Didn't want to take it with pain meds, he wanted to go off anyway. Was put on it after his dad died and he couldn't get out of bed. Doesn't need now.  Feels pretty depressed b/c of pain. Several herniated discs in low back, for about 5 months now. Has had an injection a month or so ago, not helpful so far. Works in [employer] and it's pretty rough. Sleeps about 3 hours/hs, b/c pain. Has decreased energy and motivation because he is not sleeping very well.  Appetite has not changed.  No extreme sadness, tearfulness, or feelings of hopelessness.  Denies any changes in concentration, making decisions or remembering things.  Denies suicidal or homicidal thoughts.  Anxiety is still a problem, more generalized with a sense of unease, like nothing bad.  He feels that way even if just a short time, the majority of days of the week.  No panic attacks.  Valium does help.  Patient denies increased energy with decreased need for sleep, no increased talkativeness, no racing thoughts, no impulsivity or risky behaviors, no increased spending, no increased libido, no grandiosity, no increased irritability or anger, no paranoia, and no hallucinations.  Denies dizziness, syncope, seizures, numbness, tingling, tremor, tics, unsteady gait, slurred speech, confusion.  Back pain as discussed above.  Individual Medical History/ Review of Systems: Changes? :Yes   endoscopy 11/05/20 Back pain from herniated discs.  Past medications for mental health diagnoses include: Effexor caused tremor, Nortriptylline  Allergies: Gabapentin and Morphine and related  Current Medications:  Current  Outpatient Medications:    buPROPion (WELLBUTRIN XL) 150 MG 24 hr tablet, Take 1 tablet (150 mg total) by mouth daily., Disp: 30 tablet, Rfl: 1   diazepam (VALIUM) 10 MG tablet, Take 1 tablet (10 mg total) by mouth 2 (two) times daily as needed for anxiety (panic)., Disp: 60 tablet, Rfl: 5   losartan (COZAAR) 100 MG tablet, TAKE 1 TABLET(100 MG) BY MOUTH DAILY (Patient taking differently: Take 100 mg by mouth daily.), Disp: 90 tablet, Rfl: 3   OVER THE COUNTER MEDICATION, Take 6 capsules by mouth See admin instructions. Pt takes fruit and vegetable supplement. 3 fruit capsules and 3 vegetable capsules once daily, Disp: , Rfl:    OVER THE COUNTER MEDICATION, Take 1 Scoop by mouth daily. Super beets supplement powder. / 15 ml, Disp: , Rfl:    pantoprazole (PROTONIX) 40 MG tablet, Take by mouth., Disp: , Rfl:    traZODone (DESYREL) 100 MG tablet, TAKE 1 TABLET(100 MG) BY MOUTH AT BEDTIME AS NEEDED FOR SLEEP, Disp: 30 tablet, Rfl: 5   fluvoxaMINE (LUVOX) 100 MG tablet, Take 1.5 tablets (150 mg total) by mouth at bedtime. TAKE 1 TABLET(100 MG) BY MOUTH AT BEDTIME, Disp: 45 tablet, Rfl: 5   FOLIC ACID PO, Take 1 tablet by mouth daily. (Patient not taking: Reported on 04/23/2021), Disp: , Rfl:    HYDROcodone-acetaminophen (NORCO/VICODIN) 5-325 MG tablet, Take 1 tablet by mouth every 6 (six) hours as needed for moderate pain or severe pain (try to stretch medication out- use half tablet if needed). Do not take valium within 12 hours and do not drive for 8 hours  after taking this medicine (Patient not taking: Reported on 04/23/2021), Disp: 20 tablet, Rfl: 0 Medication Side Effects: none  Family Medical/ Social History: Changes? No  MENTAL HEALTH EXAM:  Blood pressure (!) 158/109, pulse 82.There is no height or weight on file to calculate BMI.  General Appearance: Casual, Neat and Well Groomed  Eye Contact:  Good  Speech:  Clear and Coherent and Normal Rate  Volume:  Normal  Mood:  Euthymic  Affect:   Appropriate  Thought Process:  Goal Directed and Descriptions of Associations: Intact  Orientation:  Full (Time, Place, and Person)  Thought Content: Logical   Suicidal Thoughts:  No  Homicidal Thoughts:  No  Memory:  WNL  Judgement:  Good  Insight:  Good  Psychomotor Activity:  Decreased and walks slowly, states his back him.  He had difficulty getting up from a position on the couch.  No assistance required  Concentration:  Concentration: Good and Attention Span: Good  Recall:  Good  Fund of Knowledge: Good  Language: Good  Assets:  Desire for Improvement  ADL's:  Intact  Cognition: WNL  Prognosis:  Good     DIAGNOSES:    ICD-10-CM   1. GAD (generalized anxiety disorder)  F41.1 fluvoxaMINE (LUVOX) 100 MG tablet    2. Mild recurrent major depression ([place])  F33.0     3. Tobacco use disorder, moderate, dependence  F17.200     4. Panic disorder  F41.0 fluvoxaMINE (LUVOX) 100 MG tablet    5. Mood disorder with depressive features due to general medical condition  F06.31 fluvoxaMINE (LUVOX) 100 MG tablet      Receiving Psychotherapy: No    RECOMMENDATIONS:  PDMP was reviewed.  Last Ritalin filled 03/05/2021.  Last Valium filled 01/29/2021. Given hydrocodone per other provider.  I provided 30 minutes of face to face time during this encounter, including time spent before and after the visit in records review, medical decision making, and charting.  We discussed the melancholy, lack of energy and motivation symptoms of depression.  Recommend Wellbutrin as a mild stimulant but also antidepressant that will boost his energy.  Benefits, risks and side effects were discussed and he accepts. Smoking cessation was discussed. Continue Valium 10 mg, 1 p.o. twice daily as needed.  Continue Luvox 100 mg, 1.5 pills nightly. D/C Ritalin.  He already has. Start Wellbutrin XL 150 mg, 1 q am.  Continue trazodone 100 mg, 1 p.o. nightly as needed. Return in 2 months  Donnal Moat, [place]

## 2021-04-23 NOTE — Telephone Encounter (Signed)
Dr. Yong Channel, order that is in was not placed by you. Lattie Haw can not see any order. If you want to order this please put in new orders.

## 2021-04-23 NOTE — Telephone Encounter (Signed)
-----   Message from Nile Riggs, [place] sent at 04/22/2021 11:59 AM EDT ----- Barbaraann Boys, I do not see a referral for a CT for this patient-  Lattie Haw ----- Message ----- From: Marian Sorrow, LPN Sent: [id number]   2:22 PM EDT To: Nile Riggs, CMA  Lattie Haw,    Hello, can you look into this. See Dr. Ansel Bong message.  Butch Penny ----- Message ----- From: Marin Olp, MD Sent: 04/21/2021   1:22 PM EDT To: Nile Riggs, CMA, [facility] Housekeeper  What is status of CT to follow up pseudocyst pancreas- looks like order is in

## 2021-04-28 NOTE — Telephone Encounter (Signed)
Jazz please see Dr. Ansel Bong message and Keba's message.

## 2021-04-28 NOTE — Telephone Encounter (Signed)
Has this been handled?

## 2021-05-06 ENCOUNTER — Ambulatory Visit (INDEPENDENT_AMBULATORY_CARE_PROVIDER_SITE_OTHER): Payer: 59 | Admitting: Physical Medicine and Rehabilitation

## 2021-05-06 ENCOUNTER — Other Ambulatory Visit: Payer: Self-pay

## 2021-05-06 ENCOUNTER — Ambulatory Visit: Payer: Self-pay

## 2021-05-06 ENCOUNTER — Encounter: Payer: Self-pay | Admitting: Physical Medicine and Rehabilitation

## 2021-05-06 VITALS — BP 151/99 | HR 84

## 2021-05-06 DIAGNOSIS — M5416 Radiculopathy, lumbar region: Secondary | ICD-10-CM | POA: Diagnosis not present

## 2021-05-06 MED ORDER — BETAMETHASONE SOD PHOS & ACET 6 (3-3) MG/ML IJ SUSP
12.0000 mg | Freq: Once | INTRAMUSCULAR | Status: AC
  Administered 2021-05-06: 12 mg

## 2021-05-06 NOTE — Procedures (Signed)
Lumbar Epidural Steroid Injection - Interlaminar Approach with Fluoroscopic Guidance  Patient: Glenn [name]      Date of Birth: [birth year] MRN: [id number] PCP: Marin Olp, MD      Visit Date: 05/06/2021   Universal Protocol:     Consent Given By: the patient  Position: PRONE  Additional Comments: Vital signs were monitored before and after the procedure. Patient was prepped and draped in the usual sterile fashion. The correct patient, procedure, and site was verified.   Injection Procedure Details:   Procedure diagnoses: Lumbar radiculopathy [M54.16]   Meds Administered:  Meds ordered this encounter  Medications   betamethasone acetate-betamethasone sodium phosphate (CELESTONE) injection 12 mg     Laterality: Right  Location/Site:  L5-S1  Needle: 3.5 in., 20 ga. Tuohy  Needle Placement: Paramedian epidural  Findings:   -Comments: Excellent flow of contrast into the epidural space.  Procedure Details: Using a paramedian approach from the side mentioned above, the region overlying the inferior lamina was localized under fluoroscopic visualization and the soft tissues overlying this structure were infiltrated with 4 ml. of 1% Lidocaine without Epinephrine. The Tuohy needle was inserted into the epidural space using a paramedian approach.   The epidural space was localized using loss of resistance along with counter oblique bi-planar fluoroscopic views.  After negative aspirate for air, blood, and CSF, a 2 ml. volume of Isovue-250 was injected into the epidural space and the flow of contrast was observed. Radiographs were obtained for documentation purposes.    The injectate was administered into the level noted above.   Additional Comments:  The patient tolerated the procedure well Dressing: 2 x 2 sterile gauze and Band-Aid    Post-procedure details: Patient was observed during the procedure. Post-procedure instructions were reviewed.  Patient left the  clinic in stable condition.

## 2021-05-06 NOTE — Patient Instructions (Signed)

## 2021-05-06 NOTE — Progress Notes (Signed)
Pt state lower back pain that travels down both legs. Pt state laying down makes the pain worse and hard for him to sleep. Pt state walking and standing makes the pain worse. Pt state he doesn't take anything for the pain. Pt state bring his knee to his chest helps the pain. Pt has hx of inj on 03/11/21 pt state it didn't help and pt had a lot of cramping.  Numeric Pain Rating Scale and Functional Assessment Average Pain 5   In the last MONTH (on 0-10 scale) has pain interfered with the following?  1. General activity like being  able to carry out your everyday physical activities such as walking, climbing stairs, carrying groceries, or moving a chair?  Rating(8)   +Driver, -BT, -Dye Allergies.

## 2021-05-06 NOTE — Progress Notes (Signed)
Glenn [name] - 40 y.o. male MRN [id number]  Date of birth: September 13, 1980  Office Visit Note: Visit Date: 05/06/2021 PCP: Marin Olp, MD Referred by: Marin Olp, MD  Subjective: Chief Complaint  Patient presents with   Lower Back - Pain   Left Leg - Pain   Right Leg - Pain   HPI:  Glenn [name] is a 40 y.o. male who comes in today for planned Right L4-L5 Lumbar Interlaminar epidural steroid injection with fluoroscopic guidance.  The patient has failed conservative care including home exercise, medications, time and activity modification.  This injection will be diagnostic and hopefully therapeutic.  Please see requesting physician notes for further details and justification. MRI reviewed with images and spine model.  MRI reviewed in the note below.     ROS Otherwise per HPI.  Assessment & Plan: Visit Diagnoses:    ICD-10-CM   1. Lumbar radiculopathy  M54.16 XR C-ARM NO REPORT    Epidural Steroid injection    betamethasone acetate-betamethasone sodium phosphate (CELESTONE) injection 12 mg      Plan: No additional findings.   Meds & Orders:  Meds ordered this encounter  Medications   betamethasone acetate-betamethasone sodium phosphate (CELESTONE) injection 12 mg    Orders Placed This Encounter  Procedures   XR C-ARM NO REPORT   Epidural Steroid injection    Follow-up: Return if symptoms worsen or fail to improve.   Procedures: No procedures performed  Lumbar Epidural Steroid Injection - Interlaminar Approach with Fluoroscopic Guidance  Patient: Glenn [name]      Date of Birth: Aug 07, 1981 MRN: [id number] PCP: Marin Olp, MD      Visit Date: 05/06/2021   Universal Protocol:     Consent Given By: the patient  Position: PRONE  Additional Comments: Vital signs were monitored before and after the procedure. Patient was prepped and draped in the usual sterile fashion. The correct patient, procedure, and site was verified.   Injection Procedure  Details:   Procedure diagnoses: Lumbar radiculopathy [M54.16]   Meds Administered:  Meds ordered this encounter  Medications   betamethasone acetate-betamethasone sodium phosphate (CELESTONE) injection 12 mg     Laterality: Right  Location/Site:  L5-S1  Needle: 3.5 in., 20 ga. Tuohy  Needle Placement: Paramedian epidural  Findings:   -Comments: Excellent flow of contrast into the epidural space.  Procedure Details: Using a paramedian approach from the side mentioned above, the region overlying the inferior lamina was localized under fluoroscopic visualization and the soft tissues overlying this structure were infiltrated with 4 ml. of 1% Lidocaine without Epinephrine. The Tuohy needle was inserted into the epidural space using a paramedian approach.   The epidural space was localized using loss of resistance along with counter oblique bi-planar fluoroscopic views.  After negative aspirate for air, blood, and CSF, a 2 ml. volume of Isovue-250 was injected into the epidural space and the flow of contrast was observed. Radiographs were obtained for documentation purposes.    The injectate was administered into the level noted above.   Additional Comments:  The patient tolerated the procedure well Dressing: 2 x 2 sterile gauze and Band-Aid    Post-procedure details: Patient was observed during the procedure. Post-procedure instructions were reviewed.  Patient left the clinic in stable condition.    Clinical History: MRI LUMBAR SPINE WITHOUT CONTRAST   TECHNIQUE: Multiplanar, multisequence MR imaging of the lumbar spine was performed. No intravenous contrast was administered.   COMPARISON:  Prior radiograph from  12/22/2020   FINDINGS: Segmentation: Standard. Lowest well-formed disc space labeled the L5-S1 level.   Alignment: Mild straightening of the normal lumbar lordosis. No listhesis.   Vertebrae: Vertebral body height well maintained without acute or chronic  fracture. Bone marrow signal intensity within normal limits. No discrete or worrisome osseous lesions. No abnormal marrow edema.   Conus medullaris and cauda equina: Conus extends to the L1 level. Conus and cauda equina appear normal.   Paraspinal and other soft tissues: Paraspinous soft tissues within normal limits. Visualized visceral structures are normal.   Disc levels:   L1-2:  Unremarkable.   L2-3:  Unremarkable.   L3-4:  Unremarkable.   L4-5: Broad-based left foraminal to extraforaminal disc protrusion contacts the exiting left L4 nerve root as it courses of the left neural foramen (series 5, image 12). Superimposed mild facet spurring. No spinal stenosis. Mild left L4 foraminal narrowing. Right neural foramina remains patent.   L5-S1: Mild disc bulge. Superimposed left foraminal disc protrusion with annular fissure, closely approximating and/or contacting the exiting left L5 nerve root (series 6, image 34). Minimal facet hypertrophy. No significant spinal stenosis. Mild left L5 foraminal narrowing. Right neural foramina remains patent.   IMPRESSION: 1. Left foraminal to extraforaminal disc protrusion at L4-5, contacting the exiting left L4 nerve root. 2. Left foraminal disc protrusion with annular fissure at L5-S1, closely approximating and potentially affecting the exiting left L5 nerve root.     Electronically Signed   By: Jeannine Boga M.D.   On: 01/04/2021 20:17     Objective:  VS:  HT:    WT:   BMI:     BP:(!) 151/99  HR:84bpm  TEMP: ( )  RESP:  Physical Exam Vitals and nursing note reviewed.  Constitutional:      General: He is not in acute distress.    Appearance: Normal appearance. He is not ill-appearing.  HENT:     Head: Normocephalic and atraumatic.     Right Ear: External ear normal.     Left Ear: External ear normal.     Nose: No congestion.  Eyes:     Extraocular Movements: Extraocular movements intact.  Cardiovascular:      Rate and Rhythm: Normal rate.     Pulses: Normal pulses.  Pulmonary:     Effort: Pulmonary effort is normal. No respiratory distress.  Abdominal:     General: There is no distension.     Palpations: Abdomen is soft.  Musculoskeletal:        General: No tenderness or signs of injury.     Cervical back: Neck supple.     Right lower leg: No edema.     Left lower leg: No edema.     Comments: Patient has good distal strength without clonus.  Skin:    Findings: No erythema or rash.  Neurological:     General: No focal deficit present.     Mental Status: He is alert and oriented to person, place, and time.     Sensory: No sensory deficit.     Motor: No weakness or abnormal muscle tone.     Coordination: Coordination normal.  Psychiatric:        Mood and Affect: Mood normal.        Behavior: Behavior normal.     Imaging: No results found.

## 2021-05-21 ENCOUNTER — Encounter: Payer: Self-pay | Admitting: Family Medicine

## 2021-06-24 ENCOUNTER — Encounter: Payer: Self-pay | Admitting: Physician Assistant

## 2021-06-24 ENCOUNTER — Other Ambulatory Visit: Payer: Self-pay

## 2021-06-24 ENCOUNTER — Ambulatory Visit (INDEPENDENT_AMBULATORY_CARE_PROVIDER_SITE_OTHER): Payer: 59 | Admitting: Physician Assistant

## 2021-06-24 VITALS — BP 163/107 | HR 97

## 2021-06-24 DIAGNOSIS — F1120 Opioid dependence, uncomplicated: Secondary | ICD-10-CM

## 2021-06-24 DIAGNOSIS — F330 Major depressive disorder, recurrent, mild: Secondary | ICD-10-CM | POA: Diagnosis not present

## 2021-06-24 DIAGNOSIS — F410 Panic disorder [episodic paroxysmal anxiety] without agoraphobia: Secondary | ICD-10-CM | POA: Diagnosis not present

## 2021-06-24 DIAGNOSIS — F411 Generalized anxiety disorder: Secondary | ICD-10-CM | POA: Diagnosis not present

## 2021-06-24 DIAGNOSIS — F17200 Nicotine dependence, unspecified, uncomplicated: Secondary | ICD-10-CM

## 2021-06-24 MED ORDER — CLONIDINE HCL 0.1 MG PO TABS: 0.1000 mg | ORAL_TABLET | Freq: Three times a day (TID) | ORAL | 1 refills | Status: DC

## 2021-06-24 MED ORDER — NALOXONE HCL 4 MG/0.1ML NA LIQD: NASAL | 1 refills | Status: AC

## 2021-06-24 NOTE — Progress Notes (Signed)
[facility]  Patient ID: Glenn [name],  MRN: [id number]  PCP: Marin Olp, MD  Date of Evaluation: 06/24/2021 Time spent:40 minutes  Chief Complaint:  Chief Complaint   Anxiety; Depression; Insomnia; Follow-up      HISTORY/CURRENT STATUS: HPI For routine med check.    Addicted to pain pills. Wants help to get off.  He is taking oxycodone 30 mg 5 or 6 times a day.  These are not prescribed to him.  He has been using this amount for about a year but in the past has used hydrocodone, which he has had a prescription for, and sometimes kratom.  He has done some research and would like to go to a Suboxone clinic.  He has never overdosed and is able to work even taking the Oxy.  Does not want to depend on something like this in order to live a normal life.  He is concerned about meds from off the street being laced with fentanyl now and he does not want to be a victim of something like that.  Has never been to a detox center.  Does not have the time or money to do that now either.  He has tried to go without the Oxys but even within a few hours get sweaty and anxious so he will take a piece off of the pill and those withdrawals go away.  No problems with alcohol.  He does chew tobacco.  Not ready to quit.  Patient denies loss of interest in usual activities and is able to enjoy things.  Denies decreased energy or motivation.  Appetite has not changed.  No extreme sadness, tearfulness, or feelings of hopelessness.  Denies any changes in concentration, making decisions or remembering things.  He sleeps well.  Denies suicidal or homicidal thoughts.  Patient denies increased energy with decreased need for sleep, no increased talkativeness, no racing thoughts, no impulsivity or risky behaviors, no increased spending, no increased libido, no grandiosity, no increased irritability or anger, no paranoia, and no hallucinations.  Denies dizziness, syncope, seizures, numbness, tingling,  tremor, tics, unsteady gait, slurred speech, confusion.  Chronic back pain is the reason he got on opiates in the first place.  He still has back pain even with that oxycodone that he takes now.  Individual Medical History/ Review of Systems: Changes? :No     Past medications for mental health diagnoses include: Effexor caused tremor, Nortriptylline  Allergies: Gabapentin and Morphine and related  Current Medications:  Current Outpatient Medications:    cloNIDine (CATAPRES) 0.1 MG tablet, Take 1 tablet (0.1 mg total) by mouth 3 (three) times daily., Disp: 90 tablet, Rfl: 1   diazepam (VALIUM) 10 MG tablet, Take 1 tablet (10 mg total) by mouth 2 (two) times daily as needed for anxiety (panic)., Disp: 60 tablet, Rfl: 5   losartan (COZAAR) 100 MG tablet, TAKE 1 TABLET(100 MG) BY MOUTH DAILY (Patient taking differently: Take 100 mg by mouth daily.), Disp: 90 tablet, Rfl: 3   naloxone (NARCAN) nasal spray 4 mg/0.1 mL, Use as directed for overdose., Disp: 1 each, Rfl: 1   pantoprazole (PROTONIX) 40 MG tablet, Take by mouth., Disp: , Rfl:    traZODone (DESYREL) 100 MG tablet, TAKE 1 TABLET(100 MG) BY MOUTH AT BEDTIME AS NEEDED FOR SLEEP, Disp: 30 tablet, Rfl: 5   fluvoxaMINE (LUVOX) 100 MG tablet, Take 1.5 tablets (150 mg total) by mouth at bedtime. TAKE 1 TABLET(100 MG) BY MOUTH AT BEDTIME, Disp: 45 tablet, Rfl: 5  FOLIC ACID PO, Take 1 tablet by mouth daily., Disp: , Rfl:    HYDROcodone-acetaminophen (NORCO/VICODIN) 5-325 MG tablet, Take 1 tablet by mouth every 6 (six) hours as needed for moderate pain or severe pain (try to stretch medication out- use half tablet if needed). Do not take valium within 12 hours and do not drive for 8 hours after taking this medicine (Patient not taking: Reported on 06/27/2021), Disp: 20 tablet, Rfl: 0   OVER THE COUNTER MEDICATION, Take 6 capsules by mouth See admin instructions. Pt takes fruit and vegetable supplement. 3 fruit capsules and 3 vegetable capsules once  daily (Patient not taking: Reported on 06/24/2021), Disp: , Rfl:    OVER THE COUNTER MEDICATION, Take 1 Scoop by mouth daily. Super beets supplement powder. / 15 ml (Patient not taking: Reported on 06/24/2021), Disp: , Rfl:  Medication Side Effects: none  Family Medical/ Social History: Changes? No  MENTAL HEALTH EXAM:  Blood pressure (!) 163/107, pulse 97.There is no height or weight on file to calculate BMI.  General Appearance: Casual, Neat and Well Groomed  Eye Contact:  Good  Speech:  Clear and Coherent and Normal Rate  Volume:  Normal  Mood:  Euthymic  Affect:  Appropriate  Thought Process:  Goal Directed and Descriptions of Associations: Intact  Orientation:  Full (Time, Place, and Person)  Thought Content: Logical   Suicidal Thoughts:  No  Homicidal Thoughts:  No  Memory:  WNL  Judgement:  Good  Insight:  Good  Psychomotor Activity:  Normal  Concentration:  Concentration: Good and Attention Span: Good  Recall:  Good  Fund of Knowledge: Good  Language: Good  Assets:  Desire for Improvement  ADL's:  Intact  Cognition: WNL  Prognosis:  Good     DIAGNOSES:    ICD-10-CM   1. Uncomplicated opioid dependence ([place])  F11.20     2. Mild recurrent major depression ([place])  F33.0     3. GAD (generalized anxiety disorder)  F41.1     4. Panic disorder  F41.0     5. Tobacco use disorder, moderate, dependence  F17.200        Receiving Psychotherapy: No    RECOMMENDATIONS:  PDMP was reviewed.  Hydrocodone last filled 03/31/2021.  Adderall last filled 03/05/2021. I provided 40  minutes of face to face time during this encounter, including time spent before and after the visit in records review, medical decision making, pertinent to today's visit, and charting.  I consulted with Sammuel Cooper, LCSW who kindly joined [place] in the visit for a few minutes to explain different types of rehab.  She is a alcohol and substance abuse counselor.  She recommended that he contact [facility], where he would have medical management as well as counseling.  If he is not able to get in for counseling there soon, Lonn Georgia will see him.  Nox and I discussed him decreasing the number of oxycodone pills he takes daily.  For example take 5/day for 1 week, then 4/day for 1 week then on and on until he is off.  He can at least start this prior to getting into [facility].  We also discussed clonidine, his blood pressure is elevated anyway so this may be helpful, it helps withdrawals from opiates.  He knows to hold the clonidine if his blood pressure is less than 110/70. Briefly discussed naltrexone but will not start that.  The Suboxone clinic will discuss that with him. Recommend a Narcan nasal  spray.  Hopefully he will not need it but he will have it if so.  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 Start clonidine 0.1 mg, 1 p.o. 3 times daily.  Again, hold prior to each dose if BP less than 110/70. Continue Valium 10 mg, 1 p.o. twice daily as needed.  Continue Luvox 100 mg, 1.5 pills nightly. Continue trazodone 100 mg, 1 p.o. nightly as needed. Return in 4 wks.  Donnal Moat, PA-C

## 2021-06-24 NOTE — Patient Instructions (Addendum)
Hold the clonidine if your blood pressure is less than 110/70  [facility]

## 2021-06-25 ENCOUNTER — Telehealth: Payer: Self-pay | Admitting: Physician Assistant

## 2021-06-25 NOTE — Telephone Encounter (Signed)
Any location is fine.  I have not been able to check with [facility] about the 1 she recommended.  I would assume its the one in [place] but do not know for sure.  I think the most important thing is for him to get in as soon as possible.

## 2021-06-25 NOTE — Telephone Encounter (Signed)
Asser called back and gave me the same question again. I asked if he had called earlier and he said no.? Anyway, he wants to make sure it is ok for him to go to any location for the [facility]. Not sure which one to pick. [phone]

## 2021-06-25 NOTE — Telephone Encounter (Signed)
Putting  the message below as a "telephone call" message vs just an instant message so there's a record of it and it doesn't disappear.    Hey Glenn [name] filled out the paperwork for release of info between [place] and [facility]..in looking them up online I found an address in [place] and one in [place] and then a website that I wasn't sure about...can you confirm which address and I will call and get fax number to send them the release so they can have it on file   When you have time, pls let me know about this.  I have his paperwork on my desk.

## 2021-06-28 NOTE — Telephone Encounter (Signed)
noted 

## 2021-06-28 NOTE — Telephone Encounter (Signed)
Spoke with pt and gave him info.

## 2021-07-12 ENCOUNTER — Encounter: Payer: Self-pay | Admitting: Family Medicine

## 2021-07-13 NOTE — Telephone Encounter (Signed)
Patient states he will call back to schedule appt.

## 2021-07-16 ENCOUNTER — Other Ambulatory Visit: Payer: Self-pay

## 2021-07-16 ENCOUNTER — Encounter: Payer: Self-pay | Admitting: Family Medicine

## 2021-07-16 ENCOUNTER — Ambulatory Visit (INDEPENDENT_AMBULATORY_CARE_PROVIDER_SITE_OTHER): Payer: 59 | Admitting: Family Medicine

## 2021-07-16 VITALS — BP 142/96 | HR 88 | Temp 98.7°F | Ht 68.0 in | Wt 225.0 lb

## 2021-07-16 DIAGNOSIS — M5442 Lumbago with sciatica, left side: Secondary | ICD-10-CM

## 2021-07-16 DIAGNOSIS — F3341 Major depressive disorder, recurrent, in partial remission: Secondary | ICD-10-CM

## 2021-07-16 DIAGNOSIS — M5441 Lumbago with sciatica, right side: Secondary | ICD-10-CM

## 2021-07-16 DIAGNOSIS — K863 Pseudocyst of pancreas: Secondary | ICD-10-CM

## 2021-07-16 DIAGNOSIS — I10 Essential (primary) hypertension: Secondary | ICD-10-CM

## 2021-07-16 DIAGNOSIS — G8929 Other chronic pain: Secondary | ICD-10-CM

## 2021-07-16 NOTE — Patient Instructions (Addendum)
Please stop by lab before you go If you have mychart- we will send your results within 3 business days of [place] receiving them.  If you do not have mychart- we will call you about results within 5 business days of [place] receiving them.  *please also note that you will see labs on mychart as soon as they post. I will later go in and write notes on them- will say "notes from Dr. Yong Channel"  Blood pressure slightly high- please update me in 1-2 weeks when back pain is better and if not improving (and have opted not to use clonidine) then lets look at starting amlodipine 2.5 mg. Home goal BP <135/85  See work note- follow up with Dr. Erlinda Hong as well if not improving with time off of work to heal   Recommended follow up: Return in about 4 months (around 11/13/2021) for physical or sooner if needed.

## 2021-07-16 NOTE — Progress Notes (Signed)
Phone [phone] In person visit   Subjective:   Glenn [name] is a 40 y.o. year old very pleasant male patient who presents for/with See problem oriented charting Chief Complaint  Patient presents with   Follow-up   Back Pain    This visit occurred during the SARS-CoV-2 public health emergency.  Safety protocols were in place, including screening questions prior to the visit, additional usage of staff PPE, and extensive cleaning of exam room while observing appropriate contact time as indicated for disinfecting solutions.   Past Medical History-  Patient Active Problem List   Diagnosis Date Noted   Pancreatic pseudocyst 08/05/2020    Priority: High   Acute hepatitis 07/27/2020    Priority: High   Hepatic steatosis 02/11/2018    Priority: High   Alcohol use disorder, moderate, in controlled environment ([place]) 02/11/2018    Priority: High   Rosacea 10/13/2020    Priority: Medium    Chronic fatigue 10/28/2018    Priority: Medium    GAD (generalized anxiety disorder) 07/02/2018    Priority: Medium    Major depression in full remission ([place]) 07/02/2018    Priority: Medium    Tobacco abuse 02/24/2015    Priority: Medium    Essential hypertension, benign 02/18/2014    Priority: Medium    Panic disorder 12/10/2013    Priority: Medium    Hyperglycemia 02/11/2018    Priority: Low   Pancreatitis, alcoholic, acute 82/50/5397    Priority: Low   Chronic pain of right knee 04/07/2021   Baker's cyst of knee, right 10/27/2020   Patellofemoral pain syndrome of right knee 10/27/2020   Elevated ferritin 08/05/2020   Abdominal pain 07/27/2020   Mood disorder with depressive features due to general medical condition 10/28/2018   Acute pain of right knee 05/14/2018   Elevated uric acid in blood 03/16/2018    Medications- reviewed and updated Current Outpatient Medications  Medication Sig Dispense Refill   cloNIDine (CATAPRES) 0.1 MG tablet Take 1 tablet (0.1 mg total) by mouth 3  (three) times daily. 90 tablet 1   diazepam (VALIUM) 10 MG tablet Take 1 tablet (10 mg total) by mouth 2 (two) times daily as needed for anxiety (panic). 60 tablet 5   fluvoxaMINE (LUVOX) 100 MG tablet Take 1.5 tablets (150 mg total) by mouth at bedtime. TAKE 1 TABLET(100 MG) BY MOUTH AT BEDTIME 45 tablet 5   FOLIC ACID PO Take 1 tablet by mouth daily.     HYDROcodone-acetaminophen (NORCO/VICODIN) 5-325 MG tablet Take 1 tablet by mouth every 6 (six) hours as needed for moderate pain or severe pain (try to stretch medication out- use half tablet if needed). Do not take valium within 12 hours and do not drive for 8 hours after taking this medicine 20 tablet 0   losartan (COZAAR) 100 MG tablet TAKE 1 TABLET(100 MG) BY MOUTH DAILY (Patient taking differently: Take 100 mg by mouth daily.) 90 tablet 3   naloxone (NARCAN) nasal spray 4 mg/0.1 mL Use as directed for overdose. 1 each 1   OVER THE COUNTER MEDICATION Take 6 capsules by mouth See admin instructions. Pt takes fruit and vegetable supplement. 3 fruit capsules and 3 vegetable capsules once daily     OVER THE COUNTER MEDICATION Take 1 Scoop by mouth daily. Super beets supplement powder. / 15 ml     pantoprazole (PROTONIX) 40 MG tablet Take by mouth.     traZODone (DESYREL) 100 MG tablet TAKE 1 TABLET(100 MG) BY MOUTH AT BEDTIME  AS NEEDED FOR SLEEP 30 tablet 5   No current facility-administered medications for this visit.     Objective:  BP (!) 142/96   Pulse 88   Temp 98.7 F (37.1 C)   Ht 5' 8"  (1.727 m)   Wt 225 lb (102.1 kg)   SpO2 97%   BMI 34.21 kg/m  Gen: NAD, resting comfortably CV: RRR no murmurs rubs or gallops Lungs: CTAB no crackles, wheeze, rhonchi Ext: no edema Skin: warm, dry Appears uncomfortable today- moves into several different positions    Assessment and Plan   #Pancreatic lesion likely pseudocyst S: From CT scan 07/27/2020 "1. No acute findings.  No evidence of active pancreatitis. 2. 2.4 cm cyst lies between  the pancreatic tail and spleen consistent with a pseudocyst given the patient's history of pancreatitis, new since the prior CT. Recommend follow-up CT in 6-12 months to assess for stability. 3. Extensive hepatic steatosis."  Hepatic Function Latest Ref Rng & Units 04/01/2021 01/04/2021 08/18/2020  Total Protein 6.0 - 8.3 g/dL 8.3 7.1 7.7  Albumin 3.5 - 5.2 g/dL 4.9 4.3 4.5  AST 0 - 37 U/L 31 17 17   ALT 0 - 53 U/L 39 20 21  Alk Phosphatase 39 - 117 U/L 69 70 70  Total Bilirubin 0.2 - 1.2 mg/dL 0.4 0.4 0.4  Bilirubin, Direct 0.0 - 0.2 mg/dL - - -    A/P: We discussed with prior pancreatic lesion likely benign but thought we needed to repeat this-CT abdomen with contrast was ordered in July-it appears this will be scheduled in November or December - after 21st of this month- he will let me know if doesn't hear in next 2 weeks - thankfully LFTs have been ok- do need to monitor renal function (he thinks was dehydration related_    # Diffuse back pain  #work concerns S: At last visit patient's pain had shifted more toward the low back and associated with bilateral sciatica.  I recommended follow-up with Dr. Ernestina Patches for next potential injections and Dr. Erlinda Hong for upper back pain.  We also mentioned referral to chronic pain management-UDS was concerning for alcohol, opiates, benzodiazepines even though patient had reported at that visit being alcohol free since the last December. He admits today has had struggles with opiates as well as alcohol continually- was honored he was open with [place].   Unfortunately he has had a set back in his efforts to get off of pain medicine- has transitioned at work to the [employer] program which apparently has a very high injury rate - he has noted worsening pain since shifting to this program about 3 weeks ago and had planned to reduce oxycodone at home (buys it) from 5 to 4 per day but has not felt able to do this due to worsening pain.   Pain is diffuse through the whole  back. Initially pain was better with sciatica after injections in September but has worsened again.  A/P: I told patient I was concerned about his continued work in the [employer]- I recommended that he  come off of this program since pain has worsened while working it- he may return to baseline duties after a week off (I think he needs this time to recover from worsening pain)  -with worsening pain encouraged follow up with Dr. Erlinda Hong- having some thoracic pain midline as well- offered x-ray - he prefers to call them   #hypertension S: medication: Losartan 100 mg Rx- on clonidine 0.1 mg 3 times a  day per psychiatry but has not started yet Home readings #s: often up to 150 BP Readings from Last 3 Encounters:  07/16/21 (!) 142/96  06/24/21 (!) 163/107  05/06/21 (!) 151/99  A/P: he is still considering working [facility]- possible suboxone and he may Alternatively use use clonidine if tries to taper on his own- he wants to talk to them before deciding on adding amlodipine 2.5 mg- mild poor control today.    # Depression/anxiety-managed by Donnal Moat, PA of psychiatry S: Medication: Fluvoxamine 100 mg - 1.5 tablets daily, diazepam twice daily as needed, trazodone for sleep 100 mg Depression screen [facility] 2/9 07/16/2021 04/01/2021 12/30/2020  Decreased Interest 0 0 2  Down, Depressed, Hopeless 1 0 3  PHQ - 2 Score 1 0 5  Altered sleeping 3 3 3   Tired, decreased energy 3 3 3   Change in appetite - 0 0  Feeling bad or failure about yourself  0 0 0  Trouble concentrating 1 0 2  Moving slowly or fidgety/restless 0 0 0  Suicidal thoughts 0 0 0  PHQ-9 Score 8 6 13   Difficult doing work/chores Somewhat difficult Very difficult Very difficult   A/P: worsening scores with chronic pain - he thinks if current pain can calm down he will be in better sprits- if not encouraged foll ow up with psychiatry - luckily sees them early December anyway  #hyperlipidemia S: Medication:none  Lab  Results  Component Value Date   CHOL 249 (H) 09/04/2017   HDL 62.30 09/04/2017   LDLCALC 107 (H) 07/20/2016   LDLDIRECT 139.0 09/04/2017   TRIG (H) 09/04/2017    449.0 Triglyceride is over 400; calculations on Lipids are invalid.   CHOLHDL 4 09/04/2017   A/P: needs lipids next visit- will come fasting     Recommended follow up: Return in about 4 months (around 11/13/2021) for physical or sooner if needed. Future Appointments  Date Time Provider [place]  07/29/2021  2:30 PM Donnal Moat T, PA-C CP-CP None    Lab/Order associations:   ICD-10-CM   1. Chronic bilateral low back pain with bilateral sciatica  M54.42    M54.41    G89.29     2. Essential hypertension, benign  I10 CBC with Differential/Platelet    Comp Met (CMET)    Comp Met (CMET)    CBC with Differential/Platelet    CANCELED: CBC with Differential/Platelet    CANCELED: Comprehensive metabolic panel    3. Recurrent major depressive disorder, in partial remission ([place])  F33.41     4. Pancreatic pseudocyst  K86.3      Return precautions advised.  [name], MD

## 2021-07-17 LAB — COMPREHENSIVE METABOLIC PANEL
AG Ratio: 1.6 (calc) (ref 1.0–2.5)
ALT: 41 U/L (ref 9–46)
AST: 31 U/L (ref 10–40)
Albumin: 4.5 g/dL (ref 3.6–5.1)
Alkaline phosphatase (APISO): 69 U/L (ref 36–130)
BUN/Creatinine Ratio: 13 (calc) (ref 6–22)
BUN: 19 mg/dL (ref 7–25)
CO2: 26 mmol/L (ref 20–32)
Calcium: 9.8 mg/dL (ref 8.6–10.3)
Chloride: 101 mmol/L (ref 98–110)
Creat: 1.41 mg/dL — ABNORMAL HIGH (ref 0.60–1.29)
Globulin: 2.8 g/dL (calc) (ref 1.9–3.7)
Glucose, Bld: 147 mg/dL — ABNORMAL HIGH (ref 65–99)
Potassium: 4.5 mmol/L (ref 3.5–5.3)
Sodium: 139 mmol/L (ref 135–146)
Total Bilirubin: 0.3 mg/dL (ref 0.2–1.2)
Total Protein: 7.3 g/dL (ref 6.1–8.1)

## 2021-07-17 LAB — CBC WITH DIFFERENTIAL/PLATELET
Absolute Monocytes: 625 cells/uL (ref 200–950)
Basophils Absolute: 18 cells/uL (ref 0–200)
Basophils Relative: 0.2 %
Eosinophils Absolute: 238 cells/uL (ref 15–500)
Eosinophils Relative: 2.7 %
HCT: 46.3 % (ref 38.5–50.0)
Hemoglobin: 15.7 g/dL (ref 13.2–17.1)
Lymphs Abs: 3318 cells/uL (ref 850–3900)
MCH: 29.6 pg (ref 27.0–33.0)
MCHC: 33.9 g/dL (ref 32.0–36.0)
MCV: 87.2 fL (ref 80.0–100.0)
MPV: 10.4 fL (ref 7.5–12.5)
Monocytes Relative: 7.1 %
Neutro Abs: 4602 cells/uL (ref 1500–7800)
Neutrophils Relative %: 52.3 %
Platelets: 287 10*3/uL (ref 140–400)
RBC: 5.31 10*6/uL (ref 4.20–5.80)
RDW: 12.4 % (ref 11.0–15.0)
Total Lymphocyte: 37.7 %
WBC: 8.8 10*3/uL (ref 3.8–10.8)

## 2021-07-19 ENCOUNTER — Encounter: Payer: Self-pay | Admitting: Orthopaedic Surgery

## 2021-07-19 ENCOUNTER — Encounter: Payer: Self-pay | Admitting: Family Medicine

## 2021-07-19 ENCOUNTER — Telehealth: Payer: Self-pay

## 2021-07-19 DIAGNOSIS — K863 Pseudocyst of pancreas: Secondary | ICD-10-CM

## 2021-07-19 NOTE — Telephone Encounter (Signed)
-----   Message from Timothy Lasso, RN sent at 11/10/2020  9:50 AM EDT ----- Patient needs a pancreas protocol CT abdomen in November or December of this year for follow-up of pancreatic cyst/previous pancreatic necrosis. Patient will be set up for clinic visit a few weeks after the CT scan has been completed with PA Lemmon or myself.  Thanks.  GM

## 2021-07-19 NOTE — Telephone Encounter (Signed)
Order for CT has been entered and sent to the schedulers office.  They will contact the pt and set up the appt. The pt has been advised and given the number to call the schedulers if he has not heard from that office in 1 week.  He would like to call back to make follow up appt.

## 2021-07-26 ENCOUNTER — Ambulatory Visit: Payer: 59 | Admitting: Family Medicine

## 2021-07-29 ENCOUNTER — Telehealth: Payer: Self-pay

## 2021-07-29 ENCOUNTER — Encounter: Payer: Self-pay | Admitting: Family Medicine

## 2021-07-29 ENCOUNTER — Ambulatory Visit: Payer: 59 | Admitting: Physician Assistant

## 2021-07-29 NOTE — Telephone Encounter (Signed)
I spoke with (based on patients instructions from mychart to discuss with them) Cherlyn Roberts, NP: Nurse Practitioner - [place], [place]  He does telehealth for addiction [facility] opiates [facility]  He is asking if we can do a suboxone induction (he would send the directions on the induction) but it is basically over 3 hours and multiple BP checks and small amounts of suboxone given.   We would not provide the rx and patient would bring the medicine in but we would administer and monitor him.   I am not sure if that is even allowed by [place] but I said I would reach out. CMA would not have to be in room the whole time- just for BP checks and medication administration.  -Hasna can you check with whomever we would need approval from to see if we could do this- patient really wants to work on being opiate free

## 2021-07-29 NOTE — Telephone Encounter (Signed)
Ed Neomia Glass from [employer] called wanting to speak directly to Dr Yong Channel. He not mention for the reason but to speak to Dr Yong Channel. Ed can be reached at [phone]. Please Advise.

## 2021-07-30 NOTE — Telephone Encounter (Signed)
How do we refer to local suboxone clinic? Glenn [name]  If we find that out- team please offer referral ot that clinic under opioid addiction but otherwise tell him not sure if we have to set up available to be able to do the induction treatment

## 2021-08-03 ENCOUNTER — Ambulatory Visit (HOSPITAL_COMMUNITY): Payer: 59

## 2021-09-13 ENCOUNTER — Ambulatory Visit: Payer: 59 | Admitting: Physician Assistant

## 2021-09-28 ENCOUNTER — Other Ambulatory Visit: Payer: Self-pay

## 2021-09-28 ENCOUNTER — Encounter: Payer: Self-pay | Admitting: Physician Assistant

## 2021-09-28 ENCOUNTER — Ambulatory Visit (INDEPENDENT_AMBULATORY_CARE_PROVIDER_SITE_OTHER): Payer: 59 | Admitting: Physician Assistant

## 2021-09-28 DIAGNOSIS — F331 Major depressive disorder, recurrent, moderate: Secondary | ICD-10-CM | POA: Diagnosis not present

## 2021-09-28 DIAGNOSIS — F1121 Opioid dependence, in remission: Secondary | ICD-10-CM | POA: Diagnosis not present

## 2021-09-28 DIAGNOSIS — F410 Panic disorder [episodic paroxysmal anxiety] without agoraphobia: Secondary | ICD-10-CM | POA: Diagnosis not present

## 2021-09-28 DIAGNOSIS — R5383 Other fatigue: Secondary | ICD-10-CM

## 2021-09-28 DIAGNOSIS — F411 Generalized anxiety disorder: Secondary | ICD-10-CM | POA: Diagnosis not present

## 2021-09-28 MED ORDER — FLUVOXAMINE MALEATE 100 MG PO TABS: 200.0000 mg | ORAL_TABLET | Freq: Every day | ORAL | 1 refills | Status: DC

## 2021-09-28 NOTE — Progress Notes (Signed)
[facility] Med Check  Patient ID: Glenn [name],  MRN: [id number]  PCP: Marin Olp, MD  Date of Evaluation: 09/28/2021 Time spent:30 minutes  Chief Complaint:  Chief Complaint   Depression; Insomnia; Follow-up      HISTORY/CURRENT STATUS: HPI For routine med check.    Now on Suboxone through [facility] for almost 2 months.  Has not had any opiate since then.  He had a drug screen before starting the Suboxone which was positive for fentanyl.  He thought he was getting oxycodone.  "I am lucky I am alive."  He has had more back pain since being off of the pain med, states the Suboxone does help that some.  He does continue to drink 6-12 beer per week, not ready to quit that. He does chew tobacco.  Not ready to quit.  He took the clonidine for several days when he was getting off the opiate initially.  Has not been taking it since then.  Reports being depressed, feeling low, not wanting to do much of anything.  He does get up and go to work even though he does not want to.  Energy and motivation are low.  Denies any changes in concentration, making decisions or remembering things.  He sleeps well.  Denies suicidal or homicidal thoughts.  Patient denies increased energy with decreased need for sleep, no increased talkativeness, no racing thoughts, no impulsivity or risky behaviors, no increased spending, no increased libido, no grandiosity, no increased irritability or anger, no paranoia, and no hallucinations.  Denies dizziness, syncope, seizures, numbness, tingling, tremor, tics, unsteady gait, slurred speech, confusion.  Chronic lumbar pain.  Individual Medical History/ Review of Systems: Changes? :No     Past medications for mental health diagnoses include: Effexor caused tremor, Nortriptylline  Allergies: Gabapentin and Morphine and related  Current Medications:  Current Outpatient Medications:    Buprenorphine HCl-Naloxone HCl 8-2 MG FILM, SMARTSIG:2.5 Strip(s)  Sublingual Daily, Disp: , Rfl:    losartan (COZAAR) 100 MG tablet, TAKE 1 TABLET(100 MG) BY MOUTH DAILY (Patient taking differently: Take 100 mg by mouth daily.), Disp: 90 tablet, Rfl: 3   pantoprazole (PROTONIX) 40 MG tablet, Take by mouth., Disp: , Rfl:    traZODone (DESYREL) 100 MG tablet, TAKE 1 TABLET(100 MG) BY MOUTH AT BEDTIME AS NEEDED FOR SLEEP, Disp: 30 tablet, Rfl: 5   cloNIDine (CATAPRES) 0.1 MG tablet, Take 1 tablet (0.1 mg total) by mouth 3 (three) times daily. (Patient not taking: Reported on 09/28/2021), Disp: 90 tablet, Rfl: 1   diazepam (VALIUM) 10 MG tablet, Take 1 tablet (10 mg total) by mouth 2 (two) times daily as needed for anxiety (panic). (Patient not taking: Reported on 09/28/2021), Disp: 60 tablet, Rfl: 5   fluvoxaMINE (LUVOX) 100 MG tablet, Take 2 tablets (200 mg total) by mouth at bedtime., Disp: 60 tablet, Rfl: 1   FOLIC ACID PO, Take 1 tablet by mouth daily. (Patient not taking: Reported on 09/28/2021), Disp: , Rfl:    HYDROcodone-acetaminophen (NORCO/VICODIN) 5-325 MG tablet, Take 1 tablet by mouth every 6 (six) hours as needed for moderate pain or severe pain (try to stretch medication out- use half tablet if needed). Do not take valium within 12 hours and do not drive for 8 hours after taking this medicine (Patient not taking: Reported on 09/28/2021), Disp: 20 tablet, Rfl: 0   naloxone (NARCAN) nasal spray 4 mg/0.1 mL, Use as directed for overdose. (Patient not taking: Reported on 09/28/2021), Disp: 1 each, Rfl: 1  OVER THE COUNTER MEDICATION, Take 6 capsules by mouth See admin instructions. Pt takes fruit and vegetable supplement. 3 fruit capsules and 3 vegetable capsules once daily (Patient not taking: Reported on 09/28/2021), Disp: , Rfl:    OVER THE COUNTER MEDICATION, Take 1 Scoop by mouth daily. Super beets supplement powder. / 15 ml (Patient not taking: Reported on 09/28/2021), Disp: , Rfl:  Medication Side Effects: none  Family Medical/ Social History: Changes?  No  MENTAL HEALTH EXAM:  There were no vitals taken for this visit.There is no height or weight on file to calculate BMI.  General Appearance: Casual, Neat and Well Groomed  Eye Contact:  Good  Speech:  Clear and Coherent and Normal Rate  Volume:  Normal  Mood:  Depressed  Affect:  Depressed  Thought Process:  Goal Directed and Descriptions of Associations: Intact  Orientation:  Full (Time, Place, and Person)  Thought Content: Logical   Suicidal Thoughts:  No  Homicidal Thoughts:  No  Memory:  WNL  Judgement:  Good  Insight:  Good  Psychomotor Activity:  Normal  Concentration:  Concentration: Good and Attention Span: Good  Recall:  Good  Fund of Knowledge: Good  Language: Good  Assets:  Desire for Improvement  ADL's:  Intact  Cognition: WNL  Prognosis:  Good     DIAGNOSES:    ICD-10-CM   1. Major depressive disorder, recurrent episode, moderate ([facility])  F33.1     2. GAD (generalized anxiety disorder)  F41.1 fluvoxaMINE (LUVOX) 100 MG tablet    3. Panic disorder  F41.0 fluvoxaMINE (LUVOX) 100 MG tablet    4. Opioid use disorder, severe, in early remission, dependence ([place])  F11.21     5. Fatigue, unspecified type  R53.83       Receiving Psychotherapy: No    RECOMMENDATIONS:  PDMP was reviewed.  Suboxone filled 08/31/2021.  Hydrocodone filled 03/31/2021. I provided 30 minutes of face to face time during this encounter, including time spent before and after the visit in records review, medical decision making, counseling pertinent to today's visit, and charting.  Congratulations on stopping the opiate!  Keep up the good work. He is having fatigue, I recommend he see his PCP soon, he may need labs which I offered to order but he would prefer to have his PCP draw them.  We discussed the fact that his brain chemistry is going through a lot of changes right now, getting off the opiate is not easy and that can cause some depression.  I recommend increasing the Luvox.  He agrees  to that. Consider going back on clonidine which may help anxiety. Continue Valium 10 mg, 1 p.o. twice daily as needed. Rarely takes.  Increase Luvox 100 mg to 2 p.o. nightly. Continue trazodone 100 mg, 1 p.o. nightly as needed. Return in 4-6 weeks.  Donnal Moat, PA-C
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
## 2021-11-05 ENCOUNTER — Encounter: Payer: Self-pay | Admitting: Physician Assistant

## 2021-11-05 ENCOUNTER — Ambulatory Visit: Payer: 59 | Admitting: Physician Assistant

## 2021-11-05 ENCOUNTER — Other Ambulatory Visit: Payer: Self-pay

## 2021-11-05 DIAGNOSIS — F411 Generalized anxiety disorder: Secondary | ICD-10-CM | POA: Diagnosis not present

## 2021-11-05 DIAGNOSIS — F17200 Nicotine dependence, unspecified, uncomplicated: Secondary | ICD-10-CM

## 2021-11-05 DIAGNOSIS — F3341 Major depressive disorder, recurrent, in partial remission: Secondary | ICD-10-CM | POA: Diagnosis not present

## 2021-11-05 DIAGNOSIS — F1121 Opioid dependence, in remission: Secondary | ICD-10-CM

## 2021-11-05 DIAGNOSIS — F410 Panic disorder [episodic paroxysmal anxiety] without agoraphobia: Secondary | ICD-10-CM | POA: Diagnosis not present

## 2021-11-05 MED ORDER — FLUVOXAMINE MALEATE 100 MG PO TABS: 200.0000 mg | ORAL_TABLET | Freq: Every day | ORAL | 11 refills | Status: DC

## 2021-11-05 NOTE — Progress Notes (Signed)
[facility] Med Check ? ?Patient ID: Glenn [name],  ?MRN: [id number] ? ?PCP: Marin Olp, MD ? ?Date of Evaluation: 09/28/2021 ?Time spent:30 minutes ? ?Chief Complaint:  ?Chief Complaint   ?Depression; Insomnia; Follow-up ?  ? ? ? ?HISTORY/CURRENT STATUS: ?HPI For routine med check.   ? ?Doing much better since increasing the Luvox about 6 weeks ago.  Able to enjoy things.  Energy and motivation are better.  He is sleeping pretty good most of the time, takes trazodone maybe once a month or so.  ADLs and personal hygiene are normal.  Not isolating.  Work is going well, in fact he is getting a promotion!  Appetite is normal and weight is stable.  No suicidal or homicidal thoughts. ? ?Has not had any opiates in several months now.  Has been on Suboxone for 2 to 3 months.  States that is going well and he is not having any cravings. ? ?Patient denies increased energy with decreased need for sleep, no increased talkativeness, no racing thoughts, no impulsivity or risky behaviors, no increased spending, no increased libido, no grandiosity, no increased irritability or anger, no paranoia, and no hallucinations. ? ?Denies dizziness, syncope, seizures, numbness, tingling, tremor, tics, unsteady gait, slurred speech, confusion.  Chronic lumbar pain. ? ?Individual Medical History/ Review of Systems: Changes? :No    ? ?Past medications for mental health diagnoses include: ?Effexor caused tremor, Nortriptylline ? ?Allergies: Gabapentin and Morphine and related ? ?Current Medications:  ?Current Outpatient Medications:  ?  Buprenorphine HCl-Naloxone HCl 8-2 MG FILM, SMARTSIG:2.5 Strip(s) Sublingual Daily, Disp: , Rfl:  ?  losartan (COZAAR) 100 MG tablet, TAKE 1 TABLET(100 MG) BY MOUTH DAILY (Patient taking differently: Take 100 mg by mouth daily.), Disp: 90 tablet, Rfl: 3 ?  pantoprazole (PROTONIX) 40 MG tablet, Take by mouth., Disp: , Rfl:  ?  traZODone (DESYREL) 100 MG tablet, TAKE 1 TABLET(100 MG) BY MOUTH AT BEDTIME  AS NEEDED FOR SLEEP, Disp: 30 tablet, Rfl: 5 ?  diazepam (VALIUM) 10 MG tablet, Take 1 tablet (10 mg total) by mouth 2 (two) times daily as needed for anxiety (panic). (Patient not taking: Reported on 09/28/2021), Disp: 60 tablet, Rfl: 5 ?  fluvoxaMINE (LUVOX) 100 MG tablet, Take 2 tablets (200 mg total) by mouth at bedtime., Disp: 60 tablet, Rfl: 11 ?  FOLIC ACID PO, Take 1 tablet by mouth daily. (Patient not taking: Reported on 09/28/2021), Disp: , Rfl:  ?  HYDROcodone-acetaminophen (NORCO/VICODIN) 5-325 MG tablet, Take 1 tablet by mouth every 6 (six) hours as needed for moderate pain or severe pain (try to stretch medication out- use half tablet if needed). Do not take valium within 12 hours and do not drive for 8 hours after taking this medicine (Patient not taking: Reported on 09/28/2021), Disp: 20 tablet, Rfl: 0 ?  naloxone (NARCAN) nasal spray 4 mg/0.1 mL, Use as directed for overdose. (Patient not taking: Reported on 09/28/2021), Disp: 1 each, Rfl: 1 ?  OVER THE COUNTER MEDICATION, Take 6 capsules by mouth See admin instructions. Pt takes fruit and vegetable supplement. 3 fruit capsules and 3 vegetable capsules once daily (Patient not taking: Reported on 09/28/2021), Disp: , Rfl:  ?  OVER THE COUNTER MEDICATION, Take 1 Scoop by mouth daily. Super beets supplement powder. / 15 ml (Patient not taking: Reported on 09/28/2021), Disp: , Rfl:  ?Medication Side Effects: none ? ?Family Medical/ Social History: Changes?  Getting a promotion at work! ? ?MENTAL HEALTH EXAM: ? ?There were no vitals taken  for this visit.There is no height or weight on file to calculate BMI.  ?General Appearance: Casual, Neat and Well Groomed  ?Eye Contact:  Good  ?Speech:  Clear and Coherent and Normal Rate  ?Volume:  Normal  ?Mood:  Euthymic  ?Affect:  Congruent  ?Thought Process:  Goal Directed and Descriptions of Associations: Intact  ?Orientation:  Full (Time, Place, and Person)  ?Thought Content: Logical   ?Suicidal Thoughts:  No   ?Homicidal Thoughts:  No  ?Memory:  WNL  ?Judgement:  Good  ?Insight:  Good  ?Psychomotor Activity:  Normal  ?Concentration:  Concentration: Good and Attention Span: Good  ?Recall:  Good  ?Fund of Knowledge: Good  ?Language: Good  ?Assets:  Desire for Improvement  ?ADL's:  Intact  ?Cognition: WNL  ?Prognosis:  Good  ? ? ? ?DIAGNOSES:  ?  ICD-10-CM   ?1. Recurrent major depression in partial remission ([place])  F33.41   ?  ?2. GAD (generalized anxiety disorder)  F41.1 fluvoxaMINE (LUVOX) 100 MG tablet  ?  ?3. Panic disorder  F41.0 fluvoxaMINE (LUVOX) 100 MG tablet  ?  ?4. Tobacco use disorder, moderate, dependence  F17.200   ?  ?5. Opioid use disorder, severe, in early remission, dependence ([place])  F11.21   ?  ? ? ? ?Receiving Psychotherapy: No  ? ? ?RECOMMENDATIONS:  ?PDMP was reviewed.  Suboxone film 10/29/2021 ?I provided 20 minutes of face to face time during this encounter, including time spent before and after the visit in records review, medical decision making, counseling pertinent to today's visit, and charting.  ?Congratulations on the promotion at work! ?He is doing great off opiates, with continued use of Suboxone and following up with the mental health provider once a month to get that refilled. ?Briefly discussed cessation of nicotine packets.  States he is not ready yet but will let me know if I can help him in any way in the future. ?He is doing well from my medication standpoint so no changes will be made. ? ?Continue Suboxone per another provider. ?Continue Valium 10 mg, 1 p.o. twice daily as needed. (Hasn't taken in 3 months) ?Continue Luvox 100 mg, 2 p.o. nightly. ?Continue trazodone 100 mg, 1 p.o. nightly as needed. ?Return in 2 months. ? ?Donnal Moat, PA-C  ?

## 2021-11-12 ENCOUNTER — Other Ambulatory Visit: Payer: Self-pay | Admitting: Family Medicine

## 2021-11-23 ENCOUNTER — Ambulatory Visit (INDEPENDENT_AMBULATORY_CARE_PROVIDER_SITE_OTHER): Payer: 59 | Admitting: Family Medicine

## 2021-11-23 ENCOUNTER — Encounter: Payer: Self-pay | Admitting: Family Medicine

## 2021-11-23 VITALS — BP 130/90 | HR 78 | Temp 97.9°F | Ht 68.0 in | Wt 222.6 lb

## 2021-11-23 DIAGNOSIS — K863 Pseudocyst of pancreas: Secondary | ICD-10-CM | POA: Diagnosis not present

## 2021-11-23 DIAGNOSIS — Z0000 Encounter for general adult medical examination without abnormal findings: Secondary | ICD-10-CM

## 2021-11-23 DIAGNOSIS — I10 Essential (primary) hypertension: Secondary | ICD-10-CM

## 2021-11-23 DIAGNOSIS — F1911 Other psychoactive substance abuse, in remission: Secondary | ICD-10-CM | POA: Insufficient documentation

## 2021-11-23 DIAGNOSIS — F3341 Major depressive disorder, recurrent, in partial remission: Secondary | ICD-10-CM

## 2021-11-23 DIAGNOSIS — Z87891 Personal history of nicotine dependence: Secondary | ICD-10-CM | POA: Diagnosis not present

## 2021-11-23 DIAGNOSIS — E785 Hyperlipidemia, unspecified: Secondary | ICD-10-CM

## 2021-11-23 DIAGNOSIS — F3342 Major depressive disorder, recurrent, in full remission: Secondary | ICD-10-CM

## 2021-11-23 DIAGNOSIS — K760 Fatty (change of) liver, not elsewhere classified: Secondary | ICD-10-CM

## 2021-11-23 DIAGNOSIS — F1091 Alcohol use, unspecified, in remission: Secondary | ICD-10-CM

## 2021-11-23 LAB — TSH: TSH: 1.55 u[IU]/mL (ref 0.35–5.50)

## 2021-11-23 LAB — LIPID PANEL
Cholesterol: 215 mg/dL — ABNORMAL HIGH (ref 0–200)
HDL: 47.2 mg/dL (ref >39.00)
NonHDL: 167.82
Total CHOL/HDL Ratio: 5
Triglycerides: 324 mg/dL — ABNORMAL HIGH (ref 0.0–149.0)
VLDL: 64.8 mg/dL — ABNORMAL HIGH (ref 0.0–40.0)

## 2021-11-23 LAB — CBC WITH DIFFERENTIAL/PLATELET
Basophils Absolute: 0.1 10*3/uL (ref 0.0–0.1)
Basophils Relative: 0.7 % (ref 0.0–3.0)
Eosinophils Absolute: 0.2 10*3/uL (ref 0.0–0.7)
Eosinophils Relative: 2.2 % (ref 0.0–5.0)
HCT: 45.6 % (ref 39.0–52.0)
Hemoglobin: 15.7 g/dL (ref 13.0–17.0)
Lymphocytes Relative: 36.9 % (ref 12.0–46.0)
Lymphs Abs: 3.5 10*3/uL (ref 0.7–4.0)
MCHC: 34.3 g/dL (ref 30.0–36.0)
MCV: 84.8 fl (ref 78.0–100.0)
Monocytes Absolute: 0.5 10*3/uL (ref 0.1–1.0)
Monocytes Relative: 5 % (ref 3.0–12.0)
Neutro Abs: 5.3 10*3/uL (ref 1.4–7.7)
Neutrophils Relative %: 55.2 % (ref 43.0–77.0)
Platelets: 241 10*3/uL (ref 150.0–400.0)
RBC: 5.38 Mil/uL (ref 4.22–5.81)
RDW: 12.8 % (ref 11.5–15.5)
WBC: 9.5 10*3/uL (ref 4.0–10.5)

## 2021-11-23 LAB — COMPREHENSIVE METABOLIC PANEL
ALT: 25 U/L (ref 0–53)
AST: 19 U/L (ref 0–37)
Albumin: 4.8 g/dL (ref 3.5–5.2)
Alkaline Phosphatase: 91 U/L (ref 39–117)
BUN: 21 mg/dL (ref 6–23)
CO2: 27 mEq/L (ref 19–32)
Calcium: 9.8 mg/dL (ref 8.4–10.5)
Chloride: 97 mEq/L (ref 96–112)
Creatinine, Ser: 1.05 mg/dL (ref 0.40–1.50)
GFR: 88.81 mL/min (ref >60.00)
Glucose, Bld: 271 mg/dL — ABNORMAL HIGH (ref 70–99)
Potassium: 4 mEq/L (ref 3.5–5.1)
Sodium: 135 mEq/L (ref 135–145)
Total Bilirubin: 0.6 mg/dL (ref 0.2–1.2)
Total Protein: 7.3 g/dL (ref 6.0–8.3)

## 2021-11-23 LAB — POC URINALSYSI DIPSTICK (AUTOMATED)
Bilirubin, UA: NEGATIVE
Blood, UA: NEGATIVE
Glucose, UA: POSITIVE — AB
Ketones, UA: NEGATIVE
Leukocytes, UA: NEGATIVE
Nitrite, UA: NEGATIVE
Protein, UA: POSITIVE — AB
Spec Grav, UA: >=1.03 — AB (ref 1.010–1.025)
Urobilinogen, UA: 0.2 E.U./dL
pH, UA: 5.5 (ref 5.0–8.0)

## 2021-11-23 LAB — LDL CHOLESTEROL, DIRECT: Direct LDL: 129 mg/dL

## 2021-11-23 MED ORDER — LOSARTAN POTASSIUM-HCTZ 100-12.5 MG PO TABS: 1.0000 | ORAL_TABLET | Freq: Every day | ORAL | 3 refills | Status: DC

## 2021-11-23 NOTE — Assessment & Plan Note (Signed)
S: medication: Losartan 100 mg ?Home readings #s: 125/80 up to 175/100 (with stress)  ?BP Readings from Last 3 Encounters:  ?11/23/21 130/90  ?07/16/21 (!) 142/96  ?06/24/21 (!) 163/107  ?A/P: Slightly poorly controlled here and at home- we opted to add hctz as part of  combo pill so he will stop losartan 100 mg and start losartan-hctz 100-12.'5mg'$  and update me in 3 weeks with home blood pressure readings ?

## 2021-11-23 NOTE — Progress Notes (Signed)
?Phone [phone] ?In person visit ?  ?Subjective:  ? ?Glenn [name] is a 41 y.o. year old very pleasant male patient who presents for/with See problem oriented charting ? ?This visit occurred during the SARS-CoV-2 public health emergency.  Safety protocols were in place, including screening questions prior to the visit, additional usage of staff PPE, and extensive cleaning of exam room while observing appropriate contact time as indicated for disinfecting solutions.  ? ?Past Medical History-  ?Patient Active Problem List  ? Diagnosis Date Noted  ? Pancreatic pseudocyst 08/05/2020  ?  Priority: High  ? Acute hepatitis 07/27/2020  ?  Priority: High  ? Hepatic steatosis 02/11/2018  ?  Priority: High  ? Alcohol use disorder, moderate, in controlled environment ([place]) 02/11/2018  ?  Priority: High  ? Rosacea 10/13/2020  ?  Priority: Medium   ? Chronic fatigue 10/28/2018  ?  Priority: Medium   ? GAD (generalized anxiety disorder) 07/02/2018  ?  Priority: Medium   ? Major depression in full remission ([place]) 07/02/2018  ?  Priority: Medium   ? Tobacco abuse 02/24/2015  ?  Priority: Medium   ? Essential hypertension, benign 02/18/2014  ?  Priority: Medium   ? Panic disorder 12/10/2013  ?  Priority: Medium   ? Hyperglycemia 02/11/2018  ?  Priority: Low  ? Pancreatitis, alcoholic, acute 38/05/1750  ?  Priority: Low  ? Chronic pain of right knee 04/07/2021  ? Baker's cyst of knee, right 10/27/2020  ? Patellofemoral pain syndrome of right knee 10/27/2020  ? Elevated ferritin 08/05/2020  ? Abdominal pain 07/27/2020  ? Mood disorder with depressive features due to general medical condition 10/28/2018  ? Acute pain of right knee 05/14/2018  ? Elevated uric acid in blood 03/16/2018  ? ? ?Medications- reviewed and updated ?Current Outpatient Medications  ?Medication Sig Dispense Refill  ? Buprenorphine HCl-Naloxone HCl 8-2 MG FILM SMARTSIG:2.5 Strip(s) Sublingual Daily    ? fluvoxaMINE (LUVOX) 100 MG tablet Take 2 tablets (200 mg  total) by mouth at bedtime. 60 tablet 11  ? losartan-hydrochlorothiazide (HYZAAR) 100-12.5 MG tablet Take 1 tablet by mouth daily. 90 tablet 3  ? naloxone (NARCAN) nasal spray 4 mg/0.1 mL Use as directed for overdose. 1 each 1  ? OVER THE COUNTER MEDICATION Take 6 capsules by mouth See admin instructions. Pt takes fruit and vegetable supplement. 3 fruit capsules and 3 vegetable capsules once daily    ? OVER THE COUNTER MEDICATION Take 1 Scoop by mouth daily. Super beets supplement powder. / 15 ml    ? pantoprazole (PROTONIX) 40 MG tablet Take by mouth.    ? traZODone (DESYREL) 100 MG tablet TAKE 1 TABLET(100 MG) BY MOUTH AT BEDTIME AS NEEDED FOR SLEEP 30 tablet 5  ? ?No current facility-administered medications for this visit.  ? ?  ?Objective:  ?BP 130/90   Pulse 78   Temp 97.9 ?F (36.6 ?C)   Ht '5\' 8"'$  (1.727 m)   Wt 222 lb 9.6 oz (101 kg)   SpO2 98%   BMI 33.85 kg/m?  ?Gen: NAD, resting comfortably ?  ? ?Assessment and Plan  ?Essential hypertension, benign ?S: medication: Losartan 100 mg ?Home readings #s: 125/80 up to 175/100 (with stress)  ?BP Readings from Last 3 Encounters:  ?11/23/21 130/90  ?07/16/21 (!) 142/96  ?06/24/21 (!) 163/107  ?A/P: Slightly poorly controlled here and at home- we opted to add hctz as part of  combo pill so he will stop losartan 100 mg and start  losartan-hctz 100-12.'5mg'$  and update me in 3 weeks with home blood pressure readings ? ?Recommended follow up: Return in about 6 months (around 05/26/2022) for followup or sooner if needed.Schedule b4 you leave. ?Future Appointments  ?Date Time Provider [place]  ?01/06/2022  4:00 PM Hurst, Helene Kelp T, PA-C CP-CP None  ? ? ?Lab/Order associations: ?  ICD-10-CM   ?1. Essential hypertension, benign  I10   ?  ?  ? ? ?Meds ordered this encounter  ?Medications  ? losartan-hydrochlorothiazide (HYZAAR) 100-12.5 MG tablet  ?  Sig: Take 1 tablet by mouth daily.  ?  Dispense:  90 tablet  ?  Refill:  3  ? ? ?Return precautions advised.  ?[name], MD ? ? ?

## 2021-11-23 NOTE — Patient Instructions (Addendum)
Blood pressure- Slightly poorly controlled here and at home- we opted to add hctz as part of  combo pill so he will stop losartan 100 mg and start losartan-hctz 100-12.'5mg'$  and update me in 3 weeks with home blood pressure readings ? ?Team working on scheduling CT ? ?Please stop by lab before you go ?If you have mychart- we will send your results within 3 business days of [place] receiving them.  ?If you do not have mychart- we will call you about results within 5 business days of [place] receiving them.  ?*please also note that you will see labs on mychart as soon as they post. I will later go in and write notes on them- will say "notes from Dr. Yong Channel"  ? ? ?You are scheduled for your CT on Tuesday April 4th at 8am at [facility]. He will need to arrive at 7:30am.  He will need to stop by the radiology department at [facility] between today and April 3rd to pick up 2 bottles of contrast.  He will drink first bottle of contrast on April 4th at [place] bottle on April 4th at 7am.  Nothing to eat or drink 4 hours prior to procedure.  ? ? ?Mineral oil for ear full of wax ?Purchase mineral oil from laxative aisle ?Lay down on your side with ear that is bothering you facing up ?Use 3-4 drops with a dropper and place in ear for 30 seconds ?Place cotton swab outside of ear ?Turn to other side and allow this to drain ?Repeat 3-4 x a day ?Return to see [place] if not improving within a few days ? ?-We opted to add hydrochlorothiazide as part of a combination pill ?Recommended follow up: Return in about 6 months (around 05/26/2022) for followup or sooner if needed.Schedule b4 you leave. ?

## 2021-11-24 ENCOUNTER — Encounter: Payer: Self-pay | Admitting: Family Medicine

## 2021-11-24 ENCOUNTER — Other Ambulatory Visit: Payer: Self-pay

## 2021-11-24 DIAGNOSIS — R739 Hyperglycemia, unspecified: Secondary | ICD-10-CM

## 2021-11-26 ENCOUNTER — Other Ambulatory Visit: Payer: 59

## 2021-11-26 DIAGNOSIS — R739 Hyperglycemia, unspecified: Secondary | ICD-10-CM

## 2021-11-27 LAB — HEMOGLOBIN A1C
Hgb A1c MFr Bld: 10 % of total Hgb — ABNORMAL HIGH (ref <5.7)
Mean Plasma Glucose: 240 mg/dL
eAG (mmol/L): 13.3 mmol/L

## 2021-11-30 ENCOUNTER — Other Ambulatory Visit: Payer: Self-pay

## 2021-11-30 ENCOUNTER — Encounter (HOSPITAL_COMMUNITY): Payer: Self-pay

## 2021-11-30 ENCOUNTER — Ambulatory Visit (HOSPITAL_COMMUNITY)
Admission: RE | Admit: 2021-11-30 | Discharge: 2021-11-30 | Disposition: A | Discharge status: Home / Self Care | Payer: 59 | Source: Ambulatory Visit | Attending: Gastroenterology | Admitting: Gastroenterology

## 2021-11-30 DIAGNOSIS — K863 Pseudocyst of pancreas: Secondary | ICD-10-CM | POA: Insufficient documentation

## 2021-11-30 MED ORDER — FREESTYLE LITE W/DEVICE KIT
PACK | Status: DC
Start: 2021-11-30 — End: 2022-12-12

## 2021-11-30 MED ORDER — SODIUM CHLORIDE (PF) 0.9 % IJ SOLN
INTRAMUSCULAR | Status: AC
  Filled 2021-11-30: qty 50

## 2021-11-30 MED ORDER — IOHEXOL 300 MG/ML  SOLN
100.0000 mL | Freq: Once | INTRAMUSCULAR | Status: AC | PRN
  Administered 2021-11-30: 100 mL via INTRAVENOUS

## 2021-11-30 MED ORDER — METFORMIN HCL 1000 MG PO TABS: 1000.0000 mg | ORAL_TABLET | Freq: Two times a day (BID) | ORAL | 3 refills | Status: DC

## 2021-11-30 MED ORDER — FREESTYLE LITE TEST VI STRP
ORAL_STRIP | 12 refills | Status: DC
Start: 2021-11-30 — End: 2022-12-12

## 2022-01-06 ENCOUNTER — Ambulatory Visit: Payer: 59 | Admitting: Physician Assistant

## 2022-01-06 ENCOUNTER — Encounter: Payer: Self-pay | Admitting: Physician Assistant

## 2022-01-06 DIAGNOSIS — F410 Panic disorder [episodic paroxysmal anxiety] without agoraphobia: Secondary | ICD-10-CM | POA: Diagnosis not present

## 2022-01-06 DIAGNOSIS — F17200 Nicotine dependence, unspecified, uncomplicated: Secondary | ICD-10-CM | POA: Diagnosis not present

## 2022-01-06 DIAGNOSIS — F411 Generalized anxiety disorder: Secondary | ICD-10-CM | POA: Diagnosis not present

## 2022-01-06 DIAGNOSIS — F3341 Major depressive disorder, recurrent, in partial remission: Secondary | ICD-10-CM

## 2022-01-06 DIAGNOSIS — F1121 Opioid dependence, in remission: Secondary | ICD-10-CM

## 2022-01-06 MED ORDER — TRAZODONE HCL 100 MG PO TABS: 100.0000 mg | ORAL_TABLET | Freq: Every evening | ORAL | 11 refills | Status: DC | PRN

## 2022-01-06 NOTE — Progress Notes (Signed)
[facility] ? ?Patient ID: Glenn [name],  ?MRN: [id number] ? ?PCP: Marin Olp, MD ? ?Date of Evaluation: 01/06/2022  ?Time spent:30 minutes ? ?Chief Complaint:  ?Chief Complaint   ?Anxiety; Depression; Follow-up; Insomnia ?  ? ? ? ?HISTORY/CURRENT STATUS: ?HPI For routine med check.   ? ?Here for 67-monthmed check.  Doing really well.  He is able to enjoy things.  Energy and motivation are good most of the time.  ADLs and personal hygiene are normal.  Appetite is normal and weight is stable.  He sleeps well most of the time.  Does not often use the trazodone but it is effective when he needs it.  No suicidal or homicidal thoughts. ? ?He is still on Suboxone and doing well.  Has a telehealth provider that he visits monthly and then prescription is sent in.  He is having no cravings for opiates.  He has almost completely stopped drinking alcohol.  Has known fatty liver and now with a pseudocyst found on CT scan last month. ? ?Patient denies increased energy with decreased need for sleep, no increased talkativeness, no racing thoughts, no impulsivity or risky behaviors, no increased spending, no increased libido, no grandiosity, no increased irritability or anger, no paranoia, and no hallucinations. ? ?Review of Systems  ?Constitutional: Negative.   ?HENT: Negative.    ?Eyes: Negative.   ?Respiratory: Negative.    ?Cardiovascular: Negative.   ?Gastrointestinal: Negative.   ?Genitourinary: Negative.   ?Musculoskeletal:  Positive for back pain.  ?Skin: Negative.   ?Neurological: Negative.   ?Endo/Heme/Allergies: Negative.   ?Psychiatric/Behavioral:    ?     See HPI  ? ?Individual Medical History/ Review of Systems: Changes? :Yes    diagnosed with diabetes, started on metformin ? ?Past medications for mental health diagnoses include: ?Effexor caused tremor, Nortriptylline ? ?Allergies: Gabapentin and Morphine and related ? ?Current Medications:  ?Current Outpatient Medications:  ?  Blood Glucose  Monitoring Suppl (FREESTYLE LITE) w/Device KIT, Use to check blood sugars daily. Dx: E11.9, Disp: 1 kit, Rfl: KIT ?  Buprenorphine HCl-Naloxone HCl 8-2 MG FILM, SMARTSIG:2.5 Strip(s) Sublingual Daily, Disp: , Rfl:  ?  fluvoxaMINE (LUVOX) 100 MG tablet, Take 2 tablets (200 mg total) by mouth at bedtime., Disp: 60 tablet, Rfl: 11 ?  glucose blood (FREESTYLE LITE) test strip, Use to test blood sugars daily. Dx: E11.9, Disp: 100 each, Rfl: 12 ?  losartan-hydrochlorothiazide (HYZAAR) 100-12.5 MG tablet, Take 1 tablet by mouth daily., Disp: 90 tablet, Rfl: 3 ?  metFORMIN (GLUCOPHAGE) 1000 MG tablet, Take 1 tablet (1,000 mg total) by mouth 2 (two) times daily with a meal., Disp: 180 tablet, Rfl: 3 ?  naloxone (NARCAN) nasal spray 4 mg/0.1 mL, Use as directed for overdose., Disp: 1 each, Rfl: 1 ?  pantoprazole (PROTONIX) 40 MG tablet, Take by mouth., Disp: , Rfl:  ?  OVER THE COUNTER MEDICATION, Take 6 capsules by mouth See admin instructions. Pt takes fruit and vegetable supplement. 3 fruit capsules and 3 vegetable capsules once daily (Patient not taking: Reported on 01/06/2022), Disp: , Rfl:  ?  OVER THE COUNTER MEDICATION, Take 1 Scoop by mouth daily. Super beets supplement powder. / 15 ml (Patient not taking: Reported on 01/06/2022), Disp: , Rfl:  ?  traZODone (DESYREL) 100 MG tablet, Take 1 tablet (100 mg total) by mouth at bedtime as needed for sleep., Disp: 30 tablet, Rfl: 11 ?Medication Side Effects: none ? ?Family Medical/ Social History: Changes?  No ? ?MENTAL HEALTH  EXAM: ? ?There were no vitals taken for this visit.There is no height or weight on file to calculate BMI.  ?General Appearance: Casual, Neat and Well Groomed  ?Eye Contact:  Good  ?Speech:  Clear and Coherent and Normal Rate  ?Volume:  Normal  ?Mood:  Euthymic  ?Affect:  Congruent  ?Thought Process:  Goal Directed and Descriptions of Associations: Circumstantial  ?Orientation:  Full (Time, Place, and Person)  ?Thought Content: Logical   ?Suicidal  Thoughts:  No  ?Homicidal Thoughts:  No  ?Memory:  WNL  ?Judgement:  Good  ?Insight:  Good  ?Psychomotor Activity:  Normal  ?Concentration:  Concentration: Good and Attention Span: Good  ?Recall:  Good  ?Fund of Knowledge: Good  ?Language: Good  ?Assets:  Desire for Improvement  ?ADL's:  Intact  ?Cognition: WNL  ?Prognosis:  Good  ? ?Labs  ?11/23/2021 ?CBC with differential, CMP, lipid panel show elevated triglycerides, TSH reviewed. ?11/26/2021 ?Hemoglobin A1c 10.0 ? ? ?DIAGNOSES:  ?  ICD-10-CM   ?1. Recurrent major depression in partial remission ([place])  F33.41   ?  ?2. Panic disorder  F41.0 traZODone (DESYREL) 100 MG tablet  ?  ?3. GAD (generalized anxiety disorder)  F41.1 traZODone (DESYREL) 100 MG tablet  ?  ?4. Tobacco use disorder, moderate, dependence  F17.200   ?  ?5. Opioid use disorder, severe, in early remission, dependence ([place])  F11.21   ?  ? ? ?Receiving Psychotherapy: No  ? ? ?RECOMMENDATIONS:  ?PDMP was reviewed.  Suboxone film filled 12/24/2021. ?I provided 30 minutes of face to face time during this encounter, including time spent before and after the visit in records review, medical decision making, counseling pertinent to today's visit, and charting.  ?He is doing well from a mental health standpoint.   ?Congratulated him on his success with Suboxone and not using opiates. ?I would like for him to quit chewing tobacco but he and I both agreed, "1 thing at a time." ?He will do his best to get cholesterol and glucose under control. ? ?Continue Suboxone per another provider. ?Continue Valium 10 mg, 1 p.o. twice daily as needed. (Hasn't taken in 3 months) ?Continue Luvox 100 mg, 2 p.o. nightly. ?Continue trazodone 100 mg, 1 p.o. nightly as needed. ?Return in 6 months. ? ?Donnal Moat, PA-C  ?

## 2022-01-18 ENCOUNTER — Ambulatory Visit: Payer: 59 | Admitting: Gastroenterology

## 2022-01-18 ENCOUNTER — Encounter: Payer: Self-pay | Admitting: Gastroenterology

## 2022-01-18 VITALS — BP 128/80 | HR 100 | Ht 68.0 in | Wt 216.0 lb

## 2022-01-18 DIAGNOSIS — Z8719 Personal history of other diseases of the digestive system: Secondary | ICD-10-CM

## 2022-01-18 DIAGNOSIS — R935 Abnormal findings on diagnostic imaging of other abdominal regions, including retroperitoneum: Secondary | ICD-10-CM

## 2022-01-18 DIAGNOSIS — K863 Pseudocyst of pancreas: Secondary | ICD-10-CM

## 2022-01-18 NOTE — Patient Instructions (Signed)
You will need MRI/MRCP in 1 year. Office to contact you at a later time.   Follow up appointment after MRI/MRCP.   Try to decrease tobacco consumption.    If you are age 41 or younger, your body mass index should be between 19-25. Your Body mass index is 32.84 kg/m. If this is out of the aformentioned range listed, please consider follow up with your Primary Care Provider.   ________________________________________________________  The [facility] providers would like to encourage you to use [facility] to communicate with providers for non-urgent requests or questions.  Due to long hold times on the telephone, sending your provider a message by [facility] may be a faster and more efficient way to get a response.  Please allow 48 business hours for a response.  Please remember that this is for non-urgent requests.  _______________________________________________________  Thank you for choosing me and [facility].  Dr. Rush Landmark
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
## 2022-01-22 DIAGNOSIS — R935 Abnormal findings on diagnostic imaging of other abdominal regions, including retroperitoneum: Secondary | ICD-10-CM | POA: Insufficient documentation

## 2022-01-22 DIAGNOSIS — Z8719 Personal history of other diseases of the digestive system: Secondary | ICD-10-CM | POA: Insufficient documentation

## 2022-02-27 IMAGING — CT CT ABD-PELV W/ CM
2 of 4 series · 16 of 46 positions shown, 18 images · IV contrast (omnipaque)
Comparison: 02/16/2018

CLINICAL DATA: Epigastric pain with nausea beginning around
midnight last night.

EXAM:
CT ABDOMEN AND PELVIS WITH CONTRAST
TECHNIQUE: Multidetector CT imaging of the abdomen and pelvis was performed
using the standard protocol following bolus administration of
intravenous contrast.
CONTRAST:  100mL OMNIPAQUE IOHEXOL 300 MG/ML  SOLN

[Series 3: abdomen 5.0 · axial · 0.83mm/px · z∈[+804,+1234]mm · 13 of 98 slices shown, 15 images]
[im 6/98  soft-tissue]
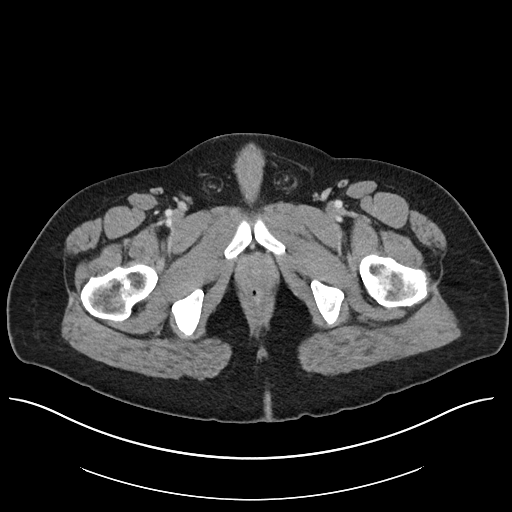
[im 6/98  bone]
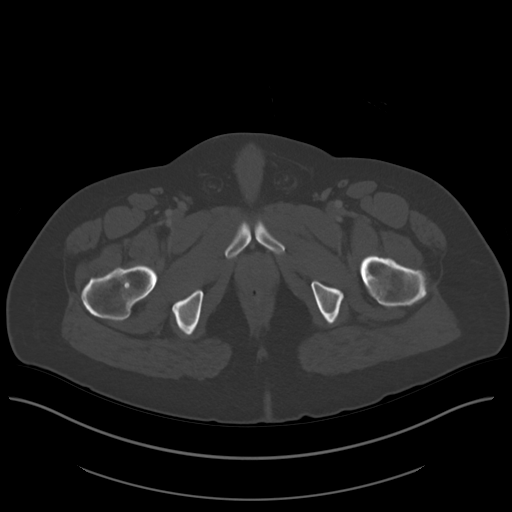
[im 16/98  soft-tissue]
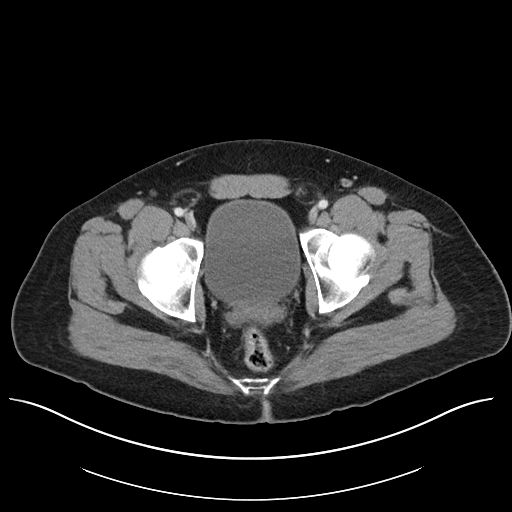
[im 21/98  soft-tissue]
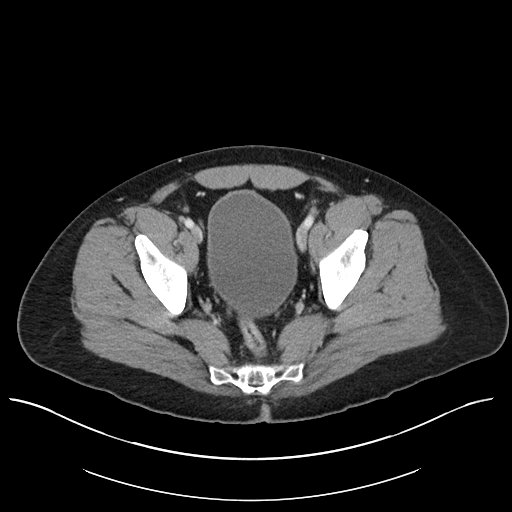
[im 26/98  soft-tissue]
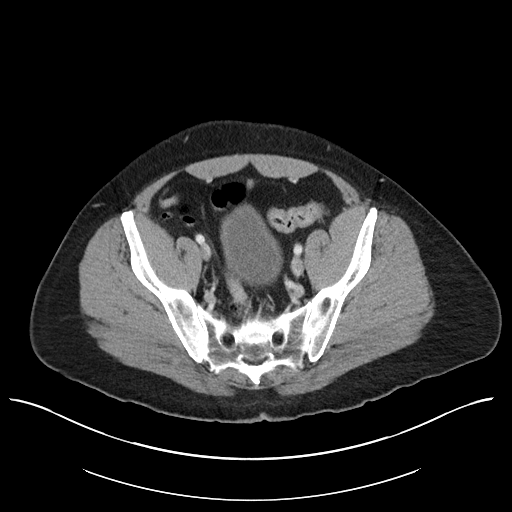
[im 36/98  soft-tissue]
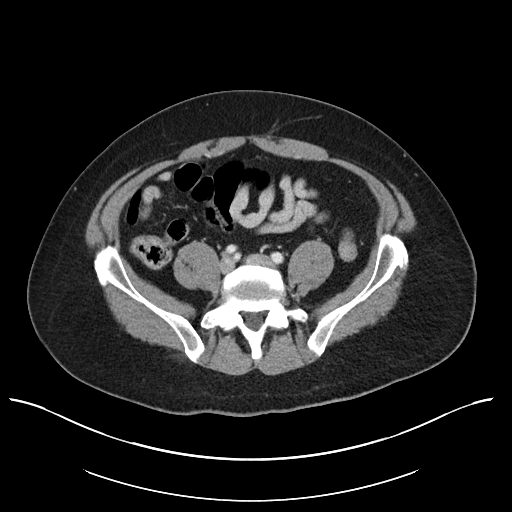
[im 41/98  soft-tissue]
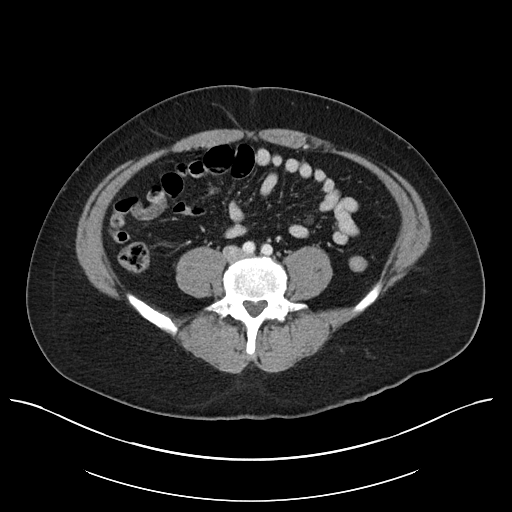
[im 52/98  soft-tissue]
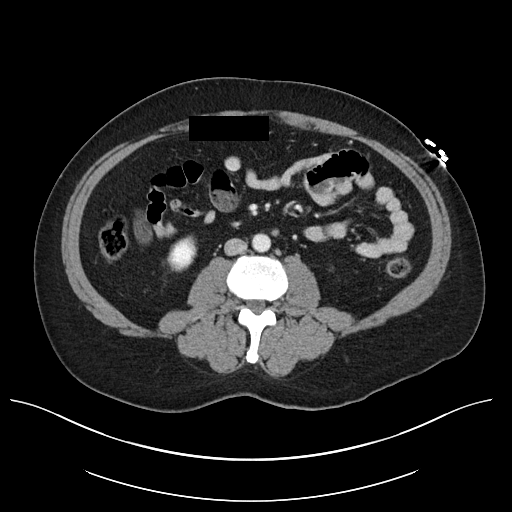
[im 57/98  soft-tissue]
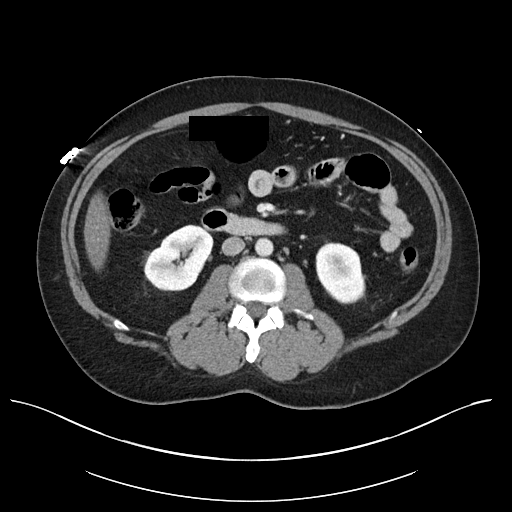
[im 62/98  soft-tissue]
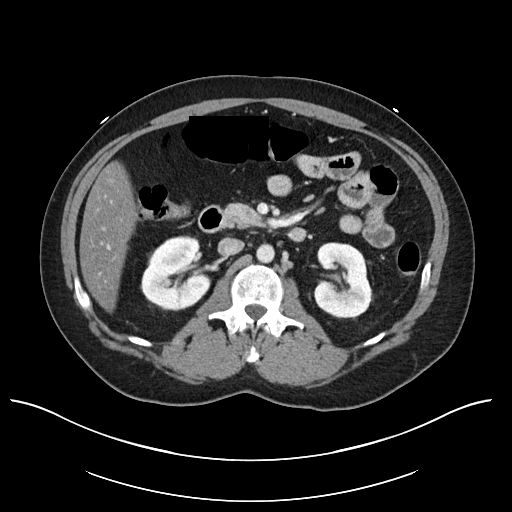
[im 62/98  bone]
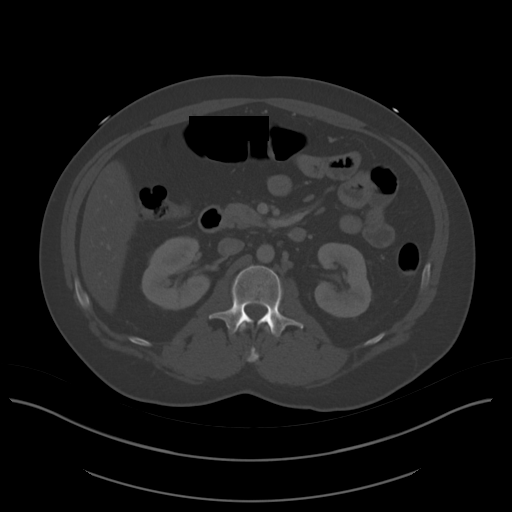
[im 72/98  soft-tissue]
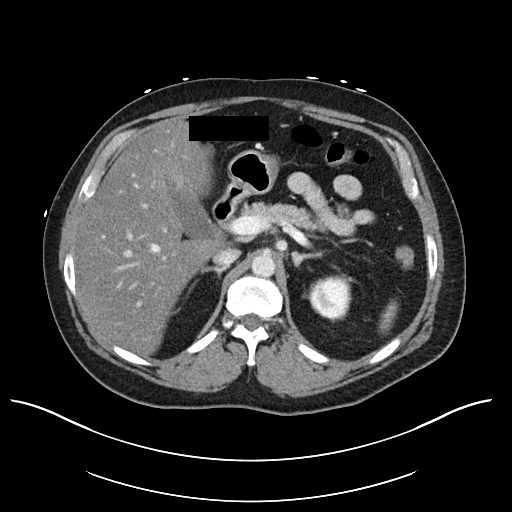
[im 77/98  soft-tissue]
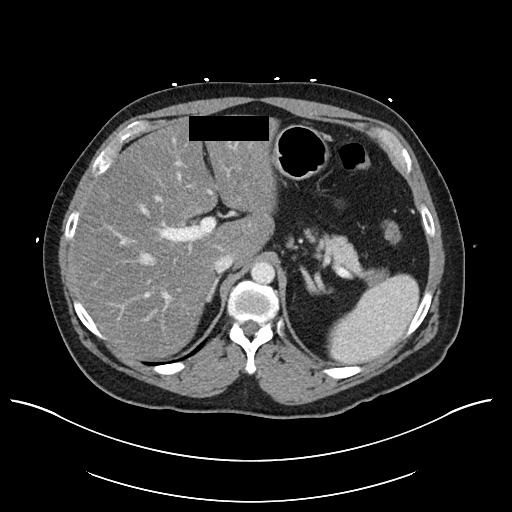
[im 82/98  soft-tissue]
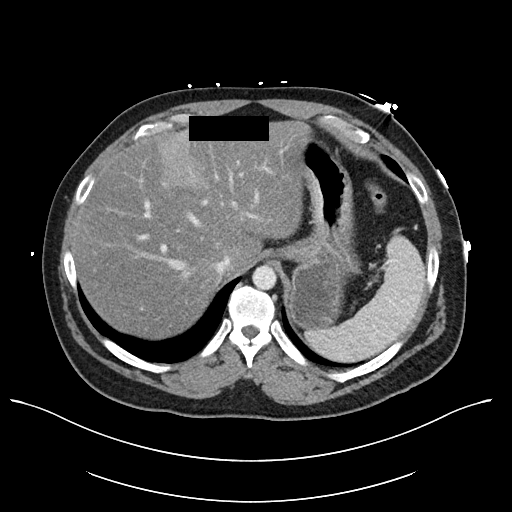
[im 92/98  soft-tissue]
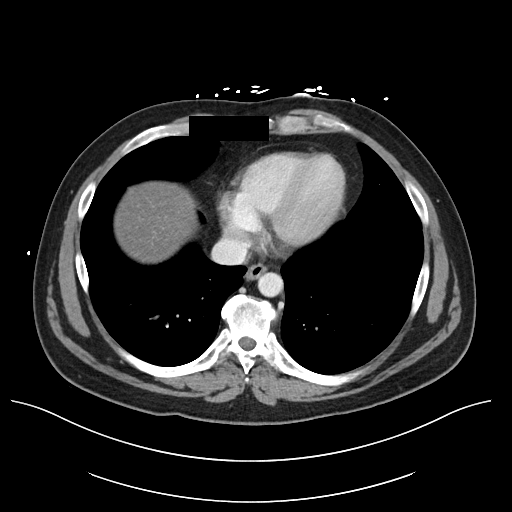

[Series 6: abdomen 3.0 mpr cor · coronal · 0.74mm/px · 3 of 84 slices shown]
[im 28/84  soft-tissue]
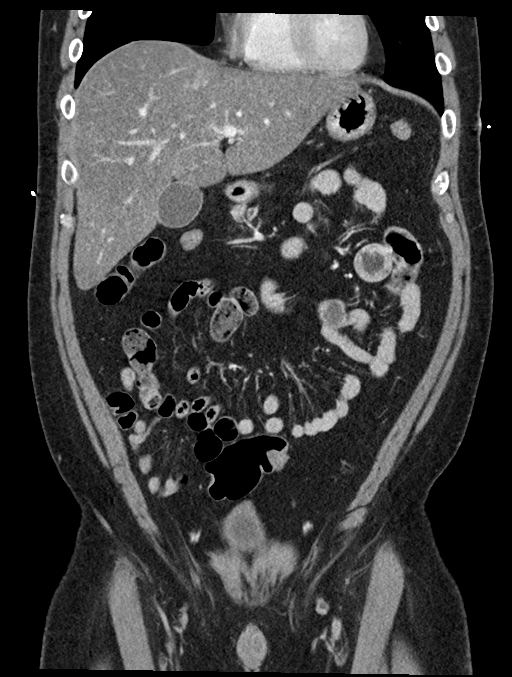
[im 37/84  soft-tissue]
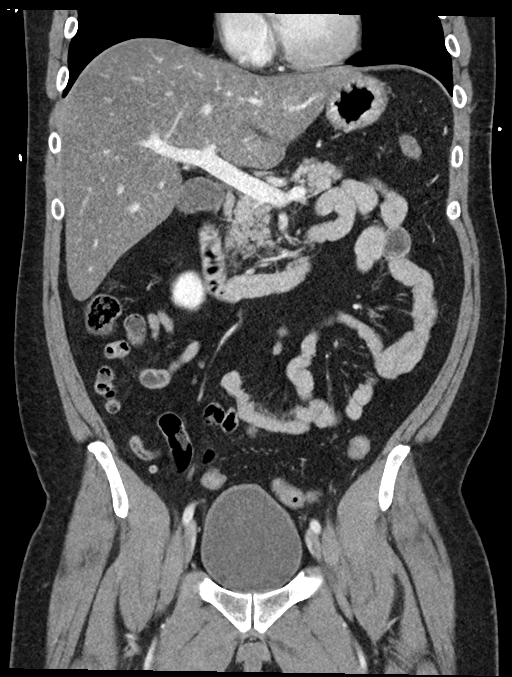
[im 47/84  soft-tissue]
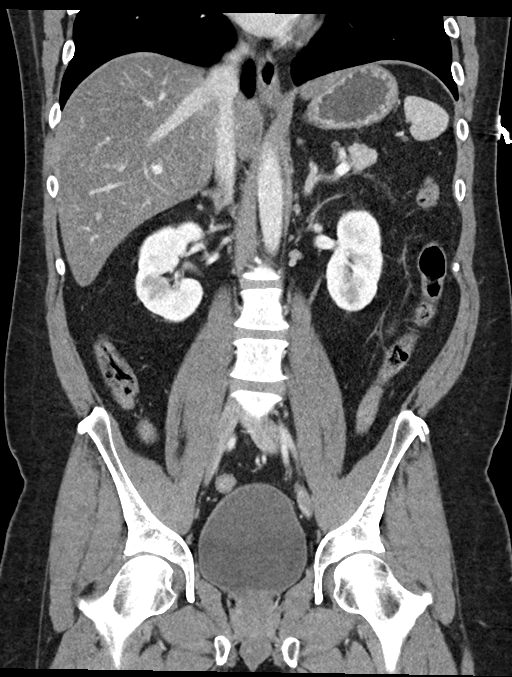

[16 of 46 positions shown; findings below may reference images not displayed]

FINDINGS: Lower chest: Clear lung bases.  Heart normal in size.

Hepatobiliary: Liver normal in size. Diffuse decreased attenuation
of the liver consistent with fatty infiltration. Fatty sparing noted
adjacent to the gallbladder and anterior superior liver. No liver
mass. Normal gallbladder. No bile duct dilation

Pancreas: Cyst lies along the pancreatic tail adjacent to the spleen
measuring 2.2 x 1.7 x 2.4 cm. No other pancreatic masses. Duct
dilation or active inflammation.

Spleen: Normal in size without focal abnormality.

Adrenals/Urinary Tract: No adrenal masses.

Kidneys normal in size, orientation and position with symmetric
enhancement. Subtle low-attenuation area in the upper pole suggests
a cysts common 9 mm in size. No other masses, no stones and no
hydronephrosis. Normal ureters. Normal bladder.

Stomach/Bowel: Stomach is within normal limits. Appendix appears
normal. No evidence of bowel wall thickening, distention, or
inflammatory changes.

Vascular/Lymphatic: No significant vascular findings are present. No
enlarged abdominal or pelvic lymph nodes.

Reproductive: Unremarkable.

Other: No abdominal wall hernia or abnormality. No abdominopelvic
ascites.

Musculoskeletal: No fracture or acute finding. No osteoblastic or
osteolytic lesions.
IMPRESSION: 1. No acute findings.  No evidence of active pancreatitis.
2. 2.4 cm cyst lies between the pancreatic tail and spleen
consistent with a pseudocyst given the patient's history of
pancreatitis, new since the prior CT. Recommend follow-up CT in 6-12
months to assess for stability.
3. Extensive hepatic steatosis.
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
## 2022-05-23 ENCOUNTER — Encounter: Payer: Self-pay | Admitting: *Deleted

## 2022-07-07 ENCOUNTER — Ambulatory Visit: Payer: 59 | Admitting: Physician Assistant

## 2022-08-04 ENCOUNTER — Ambulatory Visit: Payer: 59 | Admitting: Physician Assistant

## 2022-08-11 ENCOUNTER — Encounter: Payer: Self-pay | Admitting: *Deleted

## 2022-09-15 ENCOUNTER — Ambulatory Visit: Payer: 59 | Admitting: Physician Assistant

## 2022-10-14 ENCOUNTER — Encounter: Payer: Self-pay | Admitting: Physician Assistant

## 2022-10-14 ENCOUNTER — Other Ambulatory Visit: Payer: Self-pay | Admitting: Physician Assistant

## 2022-10-14 ENCOUNTER — Ambulatory Visit (INDEPENDENT_AMBULATORY_CARE_PROVIDER_SITE_OTHER): Payer: 59 | Admitting: Physician Assistant

## 2022-10-14 DIAGNOSIS — F3342 Major depressive disorder, recurrent, in full remission: Secondary | ICD-10-CM

## 2022-10-14 DIAGNOSIS — F17200 Nicotine dependence, unspecified, uncomplicated: Secondary | ICD-10-CM | POA: Diagnosis not present

## 2022-10-14 MED ORDER — VARENICLINE TARTRATE 0.5 MG PO TABS: ORAL_TABLET | ORAL | 0 refills | Status: DC

## 2022-10-14 MED ORDER — VARENICLINE TARTRATE 1 MG PO TABS: 1.0000 mg | ORAL_TABLET | Freq: Two times a day (BID) | ORAL | 1 refills | Status: DC

## 2022-10-14 NOTE — Progress Notes (Unsigned)
[facility] Med Check  Patient ID: Glenn [name],  MRN: [id number]  PCP: Marin Olp, MD  Date of Evaluation: 10/14/2022 Time spent:30 minutes  Chief Complaint:  Chief Complaint   Depression; Follow-up      HISTORY/CURRENT STATUS: HPI For routine med check.      Individual Medical History/ Review of Systems: Changes? :Yes    diagnosed with diabetes, started on metformin  Past medications for mental health diagnoses include: Effexor caused tremor, Nortriptylline  Allergies: Gabapentin and Morphine and related  Current Medications:  Current Outpatient Medications:    Blood Glucose Monitoring Suppl (FREESTYLE LITE) w/Device KIT, Use to check blood sugars daily. Dx: E11.9, Disp: 1 kit, Rfl: KIT   Buprenorphine HCl-Naloxone HCl 8-2 MG FILM, SMARTSIG:2.5 Strip(s) Sublingual Daily, Disp: , Rfl:    glucose blood (FREESTYLE LITE) test strip, Use to test blood sugars daily. Dx: E11.9, Disp: 100 each, Rfl: 12   losartan-hydrochlorothiazide (HYZAAR) 100-12.5 MG tablet, Take 1 tablet by mouth daily., Disp: 90 tablet, Rfl: 3   metFORMIN (GLUCOPHAGE) 1000 MG tablet, Take 1 tablet (1,000 mg total) by mouth 2 (two) times daily with a meal., Disp: 180 tablet, Rfl: 3   fluvoxaMINE (LUVOX) 100 MG tablet, Take 2 tablets (200 mg total) by mouth at bedtime. (Patient not taking: Reported on 10/14/2022), Disp: 60 tablet, Rfl: 11   naloxone (NARCAN) nasal spray 4 mg/0.1 mL, Use as directed for overdose. (Patient not taking: Reported on 10/14/2022), Disp: 1 each, Rfl: 1   pantoprazole (PROTONIX) 40 MG tablet, Take by mouth daily as needed. (Patient not taking: Reported on 10/14/2022), Disp: , Rfl:    traZODone (DESYREL) 100 MG tablet, Take 1 tablet (100 mg total) by mouth at bedtime as needed for sleep. (Patient not taking: Reported on 10/14/2022), Disp: 30 tablet, Rfl: 11 Medication Side Effects: none  Family Medical/ Social History: Changes?  No  MENTAL HEALTH EXAM:  There were no vitals  taken for this visit.There is no height or weight on file to calculate BMI.  General Appearance: Casual, Neat and Well Groomed  Eye Contact:  Good  Speech:  Clear and Coherent and Normal Rate  Volume:  Normal  Mood:  Euthymic  Affect:  Congruent  Thought Process:  Goal Directed and Descriptions of Associations: Circumstantial  Orientation:  Full (Time, Place, and Person)  Thought Content: Logical   Suicidal Thoughts:  No  Homicidal Thoughts:  No  Memory:  WNL  Judgement:  Good  Insight:  Good  Psychomotor Activity:  Normal  Concentration:  Concentration: Good and Attention Span: Good  Recall:  Good  Fund of Knowledge: Good  Language: Good  Assets:  Desire for Improvement  ADL's:  Intact  Cognition: WNL  Prognosis:  Good    DIAGNOSES:  No diagnosis found.  Receiving Psychotherapy: No   RECOMMENDATIONS:  PDMP was reviewed.  Suboxone film filled 07/30/2023.      Continue Suboxone per another provider. Continue Valium 10 mg, 1 p.o. twice daily as needed. (Hasn't taken in 3 months) Continue Luvox 100 mg, 2 p.o. nightly. Continue trazodone 100 mg, 1 p.o. nightly as needed. Return in 6 months.  Donnal Moat, PA-C

## 2022-10-25 ENCOUNTER — Encounter: Payer: Self-pay | Admitting: Gastroenterology

## 2022-11-24 ENCOUNTER — Other Ambulatory Visit: Payer: Self-pay | Admitting: Family Medicine

## 2022-11-24 ENCOUNTER — Ambulatory Visit: Payer: 59 | Admitting: Physician Assistant

## 2022-11-24 ENCOUNTER — Encounter: Payer: Self-pay | Admitting: Family Medicine

## 2022-12-02 ENCOUNTER — Encounter: Payer: Self-pay | Admitting: Family Medicine

## 2022-12-02 ENCOUNTER — Ambulatory Visit (INDEPENDENT_AMBULATORY_CARE_PROVIDER_SITE_OTHER): Payer: 59 | Admitting: Family Medicine

## 2022-12-02 VITALS — BP 130/92 | HR 77 | Temp 97.4°F | Ht 68.0 in | Wt 210.2 lb

## 2022-12-02 DIAGNOSIS — E785 Hyperlipidemia, unspecified: Secondary | ICD-10-CM

## 2022-12-02 DIAGNOSIS — Z131 Encounter for screening for diabetes mellitus: Secondary | ICD-10-CM

## 2022-12-02 DIAGNOSIS — E119 Type 2 diabetes mellitus without complications: Secondary | ICD-10-CM

## 2022-12-02 DIAGNOSIS — K863 Pseudocyst of pancreas: Secondary | ICD-10-CM | POA: Diagnosis not present

## 2022-12-02 DIAGNOSIS — Z0001 Encounter for general adult medical examination with abnormal findings: Secondary | ICD-10-CM

## 2022-12-02 DIAGNOSIS — F17200 Nicotine dependence, unspecified, uncomplicated: Secondary | ICD-10-CM | POA: Diagnosis not present

## 2022-12-02 DIAGNOSIS — I10 Essential (primary) hypertension: Secondary | ICD-10-CM | POA: Diagnosis not present

## 2022-12-02 DIAGNOSIS — K760 Fatty (change of) liver, not elsewhere classified: Secondary | ICD-10-CM | POA: Diagnosis not present

## 2022-12-02 DIAGNOSIS — Z79899 Other long term (current) drug therapy: Secondary | ICD-10-CM

## 2022-12-02 HISTORY — DX: Type 2 diabetes mellitus without complications: E11.9

## 2022-12-02 LAB — URINALYSIS, ROUTINE W REFLEX MICROSCOPIC
Bilirubin Urine: NEGATIVE
Hgb urine dipstick: NEGATIVE
Ketones, ur: NEGATIVE
Leukocytes,Ua: NEGATIVE
Nitrite: NEGATIVE
Specific Gravity, Urine: >=1.03 — AB (ref 1.000–1.030)
Total Protein, Urine: 100 — AB
Urine Glucose: 500 — AB
Urobilinogen, UA: 0.2 (ref 0.0–1.0)
pH: 6 (ref 5.0–8.0)

## 2022-12-02 LAB — MICROALBUMIN / CREATININE URINE RATIO
Creatinine,U: 148.8 mg/dL
Microalb Creat Ratio: 21.6 mg/g (ref 0.0–30.0)
Microalb, Ur: 32.2 mg/dL — ABNORMAL HIGH (ref 0.0–1.9)

## 2022-12-02 LAB — COMPREHENSIVE METABOLIC PANEL
ALT: 45 U/L (ref 0–53)
AST: 28 U/L (ref 0–37)
Albumin: 4.7 g/dL (ref 3.5–5.2)
Alkaline Phosphatase: 83 U/L (ref 39–117)
BUN: 17 mg/dL (ref 6–23)
CO2: 24 mEq/L (ref 19–32)
Calcium: 9.4 mg/dL (ref 8.4–10.5)
Chloride: 100 mEq/L (ref 96–112)
Creatinine, Ser: 1.05 mg/dL (ref 0.40–1.50)
GFR: 88.18 mL/min (ref >60.00)
Glucose, Bld: 295 mg/dL — ABNORMAL HIGH (ref 70–99)
Potassium: 4.2 mEq/L (ref 3.5–5.1)
Sodium: 135 mEq/L (ref 135–145)
Total Bilirubin: 0.6 mg/dL (ref 0.2–1.2)
Total Protein: 7.1 g/dL (ref 6.0–8.3)

## 2022-12-02 LAB — CBC WITH DIFFERENTIAL/PLATELET
Basophils Absolute: 0 10*3/uL (ref 0.0–0.1)
Basophils Relative: 0.3 % (ref 0.0–3.0)
Eosinophils Absolute: 0.2 10*3/uL (ref 0.0–0.7)
Eosinophils Relative: 3.4 % (ref 0.0–5.0)
HCT: 47.7 % (ref 39.0–52.0)
Hemoglobin: 16.5 g/dL (ref 13.0–17.0)
Lymphocytes Relative: 49.5 % — ABNORMAL HIGH (ref 12.0–46.0)
Lymphs Abs: 3.6 10*3/uL (ref 0.7–4.0)
MCHC: 34.6 g/dL (ref 30.0–36.0)
MCV: 83.7 fl (ref 78.0–100.0)
Monocytes Absolute: 0.4 10*3/uL (ref 0.1–1.0)
Monocytes Relative: 6.2 % (ref 3.0–12.0)
Neutro Abs: 2.9 10*3/uL (ref 1.4–7.7)
Neutrophils Relative %: 40.6 % — ABNORMAL LOW (ref 43.0–77.0)
Platelets: 236 10*3/uL (ref 150.0–400.0)
RBC: 5.7 Mil/uL (ref 4.22–5.81)
RDW: 12.5 % (ref 11.5–15.5)
WBC: 7.2 10*3/uL (ref 4.0–10.5)

## 2022-12-02 LAB — LIPID PANEL
Cholesterol: 189 mg/dL (ref 0–200)
HDL: 42.4 mg/dL (ref >39.00)
NonHDL: 146.32
Total CHOL/HDL Ratio: 4
Triglycerides: 244 mg/dL — ABNORMAL HIGH (ref 0.0–149.0)
VLDL: 48.8 mg/dL — ABNORMAL HIGH (ref 0.0–40.0)

## 2022-12-02 LAB — HEMOGLOBIN A1C: Hgb A1c MFr Bld: 12.5 % — ABNORMAL HIGH (ref 4.6–6.5)

## 2022-12-02 LAB — VITAMIN B12: Vitamin B-12: 409 pg/mL (ref 211–911)

## 2022-12-02 LAB — LDL CHOLESTEROL, DIRECT: Direct LDL: 110 mg/dL

## 2022-12-02 MED ORDER — METFORMIN HCL ER 500 MG PO TB24
1000.0000 mg | ORAL_TABLET | Freq: Two times a day (BID) | ORAL | 3 refills | Status: DC
Start: 2022-12-02 — End: 2023-07-17

## 2022-12-02 MED ORDER — LOSARTAN POTASSIUM-HCTZ 100-25 MG PO TABS
1.0000 | ORAL_TABLET | Freq: Every day | ORAL | 3 refills | Status: DC
Start: 2022-12-02 — End: 2023-12-28
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
## 2022-12-02 NOTE — Patient Instructions (Addendum)
Call to shedule follow up with Dr. Meridee Score  With diabetes you are eligible for prevnar 20 (pneumonia shot)   blood pressure slightly above goal today- we opted to increase losartn hydrochlorothiazide to 100-25 mg - advised him to monitor daily for next 2 weeks then update me so I can average home #s and see if he needs a 3rd medicine  Try metformin extended release to see if this is easier on stomach  Please stop by lab before you go If you have mychart- we will send your results within 3 business days of [place] receiving them.  If you do not have mychart- we will call you about results within 5 business days of [place] receiving them.  *please also note that you will see labs on mychart as soon as they post. I will later go in and write notes on them- will say "notes from Dr. Durene Cal"   Recommended follow up: Return in about 4 months (around 04/03/2023) for followup or sooner if needed.Schedule b4 you leave.

## 2022-12-02 NOTE — Progress Notes (Signed)
  Phone [phone] In person visit   Subjective:   Glenn [name] is a 42 y.o. year old very pleasant male patient who presents for/with See problem oriented charting  Past Medical History- diabetes, hypertension - see full Annual Preventive Medicine and Wellness Visit discussion  Medications- reviewed and updated Current Outpatient Medications  Medication Sig Dispense Refill   Blood Glucose Monitoring Suppl (FREESTYLE LITE) w/Device KIT Use to check blood sugars daily. Dx: E11.9 1 kit KIT   Buprenorphine HCl-Naloxone HCl 8-2 MG FILM SMARTSIG:2.5 Strip(s) Sublingual Daily     glucose blood (FREESTYLE LITE) test strip Use to test blood sugars daily. Dx: E11.9 100 each 12   losartan-hydrochlorothiazide (HYZAAR) 100-12.5 MG tablet Take 1 tablet by mouth daily. 90 tablet 3   metFORMIN (GLUCOPHAGE-XR) 500 MG 24 hr tablet Take 2 tablets (1,000 mg total) by mouth 2 (two) times daily with a meal. 180 tablet 3   varenicline (CHANTIX CONTINUING MONTH PAK) 1 MG tablet Take 1 tablet (1 mg total) by mouth 2 (two) times daily. 60 tablet 1   varenicline (CHANTIX) 0.5 MG tablet 1 po qd for 3 days, 1 po bid for 3 days, then 2 po bid 120 tablet 0   naloxone (NARCAN) nasal spray 4 mg/0.1 mL Use as directed for overdose. (Patient not taking: Reported on 10/14/2022) 1 each 1   pantoprazole (PROTONIX) 40 MG tablet Take by mouth daily as needed. (Patient not taking: Reported on 10/14/2022)     No current facility-administered medications for this visit.     Objective:  BP (!) 130/92   Pulse 77   Temp (!) 97.4 F (36.3 C)   Ht 5\' 8"  (1.727 m)   Wt 210 lb 3.2 oz (95.3 kg)   SpO2 98%   BMI 31.96 kg/m  Gen: NAD, resting comfortably     Assessment and Plan   Essential hypertension, benign S: medication: Losartan hydrochlorothiazide 100-12.5 mg Home readings #s: 120s-140's/70s to 90s BP Readings from Last 3 Encounters:  12/02/22 (!) 144/80  01/18/22 128/80  11/23/21 130/90  A/P: blood pressure  slightly above goal today- we opted to increase losaratn hydrochlorothiazide to 100-25 mg - advised him to monitor daily for next 2 weeks then update me so I can average home #s and see if he needs a 3rd medicine   Recommended follow up: 2 week mychart update- otherwise Return in about 4 months (around 04/03/2023) for followup or sooner if needed.Schedule b4 you leave. Future Appointments  Date Time Provider Department Center  12/22/2022  4:00 PM Gwynneth Macleod [facility] None  04/03/2023  9:20 AM Durene Cal Aldine Contes, MD [facility]   Lab/Order associations:   ICD-10-CM   1. Essential hypertension, benign  I10 CBC with Differential/Platelet    Comprehensive metabolic panel    Lipid panel   Meds ordered this encounter   losartan-hydrochlorothiazide (HYZAAR) 100-25 MG tablet    Sig: Take 1 tablet by mouth daily.    Dispense:  90 tablet    Refill:  3   Return precautions advised.  Tana Conch, MD

## 2022-12-02 NOTE — Assessment & Plan Note (Signed)
S: medication: Losartan hydrochlorothiazide 100-12.5 mg Home readings #s: 120s-140's/70s to 90s BP Readings from Last 3 Encounters:  12/02/22 (!) 144/80  01/18/22 128/80  11/23/21 130/90  A/P: blood pressure slightly above goal today- we opted to increase losartn hydrochlorothiazide to 100-25 mg - advised him to monitor daily for next 2 weeks then update me so I can average home #s and see if he needs a 3rd medicine

## 2022-12-03 ENCOUNTER — Encounter: Payer: Self-pay | Admitting: Family Medicine

## 2022-12-05 ENCOUNTER — Other Ambulatory Visit: Payer: Self-pay

## 2022-12-05 DIAGNOSIS — E119 Type 2 diabetes mellitus without complications: Secondary | ICD-10-CM

## 2022-12-05 MED ORDER — ROSUVASTATIN CALCIUM 10 MG PO TABS: 10.0000 mg | ORAL_TABLET | Freq: Every day | ORAL | 3 refills | Status: DC

## 2022-12-05 MED ORDER — TRESIBA FLEXTOUCH 100 UNIT/ML ~~LOC~~ SOPN: 10.0000 [IU] | PEN_INJECTOR | Freq: Every day | SUBCUTANEOUS | 5 refills | Status: DC

## 2022-12-05 MED ORDER — INSULIN PEN NEEDLE 31G X 8 MM MISC: 1.0000 | Freq: Every day | 11 refills | Status: DC

## 2022-12-08 ENCOUNTER — Encounter: Payer: Self-pay | Admitting: Family Medicine

## 2022-12-12 ENCOUNTER — Encounter: Payer: Self-pay | Admitting: Family Medicine

## 2022-12-12 ENCOUNTER — Other Ambulatory Visit: Payer: Self-pay

## 2022-12-12 MED ORDER — INSULIN PEN NEEDLE 31G X 8 MM MISC: 1.0000 | Freq: Every day | 11 refills | Status: DC

## 2022-12-12 MED ORDER — FREESTYLE LITE TEST VI STRP: ORAL_STRIP | 12 refills | Status: DC

## 2022-12-12 MED ORDER — FREESTYLE LITE W/DEVICE KIT: PACK | Status: AC

## 2022-12-16 ENCOUNTER — Encounter: Payer: Self-pay | Admitting: Family Medicine

## 2022-12-22 ENCOUNTER — Ambulatory Visit: Payer: 59 | Admitting: Physician Assistant

## 2023-02-02 ENCOUNTER — Encounter: Payer: Self-pay | Admitting: "Endocrinology

## 2023-02-02 ENCOUNTER — Ambulatory Visit: Payer: 59 | Admitting: "Endocrinology

## 2023-02-02 VITALS — BP 120/76 | HR 92 | Ht 68.0 in | Wt 213.6 lb

## 2023-02-02 DIAGNOSIS — E1165 Type 2 diabetes mellitus with hyperglycemia: Secondary | ICD-10-CM

## 2023-02-02 DIAGNOSIS — E782 Mixed hyperlipidemia: Secondary | ICD-10-CM

## 2023-02-02 DIAGNOSIS — Z794 Long term (current) use of insulin: Secondary | ICD-10-CM | POA: Diagnosis not present

## 2023-02-02 DIAGNOSIS — Z7984 Long term (current) use of oral hypoglycemic drugs: Secondary | ICD-10-CM

## 2023-02-02 MED ORDER — DEXCOM G7 SENSOR MISC: 1.0000 | 0 refills | Status: DC
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
## 2023-02-02 NOTE — Patient Instructions (Addendum)
Metformin XR 500 mg BID Tresiba 20 units qd. Increase by 1 units a every day until fasting blood sugar is less than 150. Stay on that dose.    _______________   Goals of DM therapy:  Morning Fasting blood sugar: 80-140  Blood sugar before meals: 80-140 Bed time blood sugar: 100-150  A1C <7%, limited only by hypoglycemia  1.Diabetes medications and their side effects discussed, including hypoglycemia    2. Check blood glucose:  a) Always check blood sugars before driving. Please see below (under hypoglycemia) on how to manage b) Check a minimum of 3 times/day or more as needed when having symptoms of hypoglycemia.   c) Try to check blood glucose before sleeping/in the middle of the night to ensure that it is remaining stable and not dropping less than 100 d) Check blood glucose more often if sick  3. Diet: a) 3 meals per day schedule b: Restrict carbs to 60-70 grams (4 servings) per meal c) Colorful vegetables - 3 servings a day, and low sugar fruit 2 servings/day Plate control method: 1/4 plate protein, 1/4 starch, 1/2 green, yellow, or red vegetables d) Avoid carbohydrate snacks unless hypoglycemic episode, or increased physical activity  4. Regular exercise as tolerated, preferably 3 or more hours a week  5. Hypoglycemia: a)  Do not drive or operate machinery without first testing blood glucose to assure it is over 90 mg%, or if dizzy, lightheaded, not feeling normal, etc, or  if foot or leg is numb or weak. b)  If blood glucose less than 70, take four 5gm Glucose tabs or 15-30 gm Glucose gel.  Repeat every 15 min as needed until blood sugar is >100 mg/dl. If hypoglycemia persists then call 911.   6. Sick day management: a) Check blood glucose more often b) Continue usual therapy if blood sugars are elevated.   7. Contact the doctor immediately if blood glucose is frequently <60 mg/dl, or an episode of severe hypoglycemia occurs (where someone had to give you glucose/   glucagon or if you passed out from a low blood glucose), or if blood glucose is persistently >350 mg/dl, for further management  8. A change in level of physical activity or exercise and a change in diet may also affect your blood sugar. Check blood sugars more often and call if needed.  Instructions: 1. Bring glucose meter, blood glucose records on every visit for review 2. Continue to follow up with primary care physician and other providers for medical care 3. Yearly eye  and foot exam 4. Please get blood work done prior to the next appointment

## 2023-02-03 ENCOUNTER — Encounter: Payer: Self-pay | Admitting: Family Medicine

## 2023-02-16 ENCOUNTER — Encounter: Payer: Self-pay | Admitting: "Endocrinology

## 2023-02-16 ENCOUNTER — Ambulatory Visit: Payer: 59 | Admitting: "Endocrinology

## 2023-02-16 VITALS — BP 130/90 | HR 89 | Ht 68.0 in | Wt 214.8 lb

## 2023-02-16 DIAGNOSIS — E782 Mixed hyperlipidemia: Secondary | ICD-10-CM | POA: Diagnosis not present

## 2023-02-16 DIAGNOSIS — Z794 Long term (current) use of insulin: Secondary | ICD-10-CM | POA: Diagnosis not present

## 2023-02-16 DIAGNOSIS — E1165 Type 2 diabetes mellitus with hyperglycemia: Secondary | ICD-10-CM | POA: Diagnosis not present

## 2023-02-16 DIAGNOSIS — Z7984 Long term (current) use of oral hypoglycemic drugs: Secondary | ICD-10-CM

## 2023-02-16 MED ORDER — DEXCOM G7 SENSOR MISC: 1.0000 | 0 refills | Status: DC

## 2023-02-16 NOTE — Patient Instructions (Addendum)
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
## 2023-02-23 ENCOUNTER — Encounter: Payer: Self-pay | Admitting: Physician Assistant

## 2023-02-23 ENCOUNTER — Ambulatory Visit: Payer: 59 | Admitting: Physician Assistant

## 2023-02-23 ENCOUNTER — Other Ambulatory Visit: Payer: Self-pay | Admitting: Physician Assistant

## 2023-02-23 DIAGNOSIS — F411 Generalized anxiety disorder: Secondary | ICD-10-CM

## 2023-02-23 DIAGNOSIS — F17200 Nicotine dependence, unspecified, uncomplicated: Secondary | ICD-10-CM | POA: Diagnosis not present

## 2023-02-23 DIAGNOSIS — F410 Panic disorder [episodic paroxysmal anxiety] without agoraphobia: Secondary | ICD-10-CM | POA: Diagnosis not present

## 2023-02-23 MED ORDER — VARENICLINE TARTRATE 0.5 MG PO TABS: ORAL_TABLET | ORAL | 0 refills | Status: DC

## 2023-02-23 MED ORDER — DIAZEPAM 10 MG PO TABS
10.0000 mg | ORAL_TABLET | Freq: Two times a day (BID) | ORAL | 2 refills | Status: DC | PRN
Start: 2023-02-23 — End: 2023-06-30

## 2023-02-23 NOTE — Progress Notes (Signed)
[facility] Check  Patient ID: Glenn [name],  MRN: [id number]  PCP: Shelva Majestic, MD  Date of Evaluation: 02/23/2023 Time spent:20 minutes  Chief Complaint:  Chief Complaint   Follow-up    HISTORY/CURRENT STATUS: HPI For routine med check.    Glenn [name] is here in follow-up for tobacco use.  We started Chantix back in February.  He did not tolerate it well.  He tried it for several weeks and occasionally had vomiting that came on suddenly.  After he stopped the Chantix he did not have any more vomiting.  He would like to retry it though.  He really wants to quit using tobacco.  He will not take Wellbutrin, his mom and aunt have had seizures and he does not want to risk it.  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  Patient is able to enjoy things.  Energy and motivation are good.  Work is going well.   No extreme sadness, tearfulness, or feelings of hopelessness.  Sleeps well. ADLs and personal hygiene are normal.   Denies any changes in concentration, making decisions, or remembering things.  Appetite has not changed.  Weight is stable.  Anxiety is not much of a problem.  Denies suicidal or homicidal thoughts.  Patient denies increased energy with decreased need for sleep, increased talkativeness, racing thoughts, impulsivity or risky behaviors, increased spending, increased libido, grandiosity, increased irritability or anger, paranoia, or hallucinations.  Denies dizziness, syncope, seizures, numbness, tingling, tremor, tics, unsteady gait, slurred speech, confusion. Denies muscle or joint pain, stiffness, or dystonia.  Individual Medical History/ Review of Systems: Changes? :No     Past medications for mental health diagnoses include: Effexor caused tremor, Nortriptylline  Allergies: Gabapentin and Morphine and codeine  Current Medications:  Current Outpatient Medications:     Buprenorphine HCl-Naloxone HCl 8-2 MG FILM, SMARTSIG:2.5 Strip(s) Sublingual Daily, Disp: , Rfl:    Continuous Glucose Sensor (DEXCOM G7 SENSOR) MISC, 1 Device by Does not apply route continuous., Disp: 9 each, Rfl: 0   glucose blood (FREESTYLE LITE) test strip, Use to test blood sugars daily. Dx: E11.9, Disp: 100 each, Rfl: 12   insulin degludec (TRESIBA FLEXTOUCH) 100 UNIT/ML FlexTouch Pen, Inject 10-20 Units into the skin daily. (Patient taking differently: Inject 10-20 Units into the skin daily. 25 units in the AM), Disp: 9 mL, Rfl: 5   Insulin Pen Needle 31G X 8 MM MISC, 1 each by Does not apply route daily., Disp: 90 each, Rfl: 11   losartan-hydrochlorothiazide (HYZAAR) 100-25 MG tablet, Take 1 tablet by mouth daily., Disp: 90 tablet, Rfl: 3   metFORMIN (GLUCOPHAGE-XR) 500 MG 24 hr tablet, Take 2 tablets (1,000 mg total) by mouth 2 (two) times daily with a meal., Disp: 180 tablet, Rfl: 3   rosuvastatin (CRESTOR) 10 MG tablet, Take 1 tablet (10 mg total) by mouth daily., Disp: 90 tablet, Rfl: 3   Blood Glucose Monitoring Suppl (FREESTYLE LITE) w/Device KIT, Use to check blood sugars daily. Dx: E11.9 (Patient not taking: Reported on 02/16/2023), Disp: 1 kit, Rfl: KIT   diazepam (VALIUM) 10 MG tablet, Take 1 tablet (10 mg total) by mouth 2 (two) times daily as needed for anxiety (panic)., Disp: 60 tablet, Rfl: 2   naloxone (NARCAN) nasal spray 4 mg/0.1 mL, Use as directed for overdose. (Patient not taking: Reported on 02/23/2023), Disp: 1 each, Rfl:  1   pantoprazole (PROTONIX) 40 MG tablet, Take by mouth daily as needed. (Patient not taking: Reported on 02/02/2023), Disp: , Rfl:    varenicline (CHANTIX CONTINUING MONTH PAK) 1 MG tablet, Take 1 tablet (1 mg total) by mouth 2 (two) times daily. (Patient not taking: Reported on 02/02/2023), Disp: 60 tablet, Rfl: 1   varenicline (CHANTIX) 0.5 MG tablet, 1 po qd for 3 days, 1 po bid for 3 days, then 2 po bid, Disp: 120 tablet, Rfl: 0 Medication Side Effects:  none  Family Medical/ Social History: Changes?  No  MENTAL HEALTH EXAM:  There were no vitals taken for this visit.There is no height or weight on file to calculate BMI.  General Appearance: Casual, Neat and Well Groomed  Eye Contact:  Good  Speech:  Clear and Coherent and Normal Rate  Volume:  Normal  Mood:  Euthymic  Affect:  Congruent  Thought Process:  Goal Directed and Descriptions of Associations: Circumstantial  Orientation:  Full (Time, Place, and Person)  Thought Content: Logical   Suicidal Thoughts:  No  Homicidal Thoughts:  No  Memory:  WNL  Judgement:  Good  Insight:  Good  Psychomotor Activity:  Normal  Concentration:  Concentration: Good and Attention Span: Good  Recall:  Good  Fund of Knowledge: Good  Language: Good  Assets:  Desire for Improvement Financial Resources/Insurance Housing Resilience Transportation Vocational/Educational  ADL's:  Intact  Cognition: WNL  Prognosis:  Good   DIAGNOSES:    ICD-10-CM   1. Tobacco use disorder, moderate, dependence  F17.200     2. Panic disorder  F41.0 diazepam (VALIUM) 10 MG tablet    3. GAD (generalized anxiety disorder)  F41.1 diazepam (VALIUM) 10 MG tablet     Receiving Psychotherapy: No   RECOMMENDATIONS:  PDMP was reviewed.  Suboxone film filled 01/27/2023. I provided 20 minutes of face to face time during this encounter, including time spent before and after the visit in records review, medical decision making, counseling pertinent to today's visit, and charting.   Cessation for tobacco discussed.  He would like to retry the Chantix which is fine.  We also discussed NicoDerm patches and Nicorette gum or lozenges.  Continue Suboxone per another provider. Restart Chantix, per med sheet.  Return in 6-8 weeks.   Melony Overly, PA-C

## 2023-04-03 ENCOUNTER — Ambulatory Visit: Payer: 59 | Admitting: Family Medicine

## 2023-04-03 ENCOUNTER — Encounter: Payer: Self-pay | Admitting: Family Medicine

## 2023-04-03 VITALS — BP 118/78 | HR 96 | Temp 98.0°F | Ht 68.0 in | Wt 220.8 lb

## 2023-04-03 DIAGNOSIS — I10 Essential (primary) hypertension: Secondary | ICD-10-CM | POA: Diagnosis not present

## 2023-04-03 DIAGNOSIS — K863 Pseudocyst of pancreas: Secondary | ICD-10-CM | POA: Diagnosis not present

## 2023-04-03 DIAGNOSIS — E1169 Type 2 diabetes mellitus with other specified complication: Secondary | ICD-10-CM | POA: Diagnosis not present

## 2023-04-03 DIAGNOSIS — K852 Alcohol induced acute pancreatitis without necrosis or infection: Secondary | ICD-10-CM

## 2023-04-03 DIAGNOSIS — E785 Hyperlipidemia, unspecified: Secondary | ICD-10-CM

## 2023-04-03 DIAGNOSIS — F1911 Other psychoactive substance abuse, in remission: Secondary | ICD-10-CM

## 2023-04-03 DIAGNOSIS — E119 Type 2 diabetes mellitus without complications: Secondary | ICD-10-CM

## 2023-04-03 MED ORDER — ESOMEPRAZOLE MAGNESIUM 40 MG PO PACK: 40.0000 mg | PACK | Freq: Every day | ORAL | 12 refills | Status: DC

## 2023-04-03 MED ORDER — OMEPRAZOLE 40 MG PO CPDR: 40.0000 mg | DELAYED_RELEASE_CAPSULE | Freq: Every day | ORAL | 3 refills | Status: DC

## 2023-04-03 NOTE — Patient Instructions (Addendum)
Get diabetic eye exam schedule.  Let us know when you get your flu shot.  Schedule a lab visit at the check out desk within 2 weeks. Return for future fasting labs meaning nothing but water after midnight please. Ok to take your medications with water.    reflux not always well controlled but was doing the pantoprazole just as needed- we discussed trying Nexium/esomeprazole 40 mg but taking daily before either breakfast or dinner   We will call you within two weeks about your referral to for CT abdomen and pelvis w without contrast through [facility].  Their phone number is [phone].  Please call them if you have not heard in 1-2 weeks  I suggest myfitnesspal Use 0.5 pounds per week weight loss goal Set a reasonable goal such as 5-10 lbs and can reset goal at that point Do not connect your step counter to this- watch or phone   Recommended follow up: Return in about 8 months (around 12/02/2023) for physical or sooner if needed.Schedule b4 you leave.

## 2023-04-03 NOTE — Addendum Note (Signed)
Addended by: Gwenette Greet on: 04/03/2023 03:02 PM   Modules accepted: Orders

## 2023-04-03 NOTE — Progress Notes (Signed)
Phone [phone] In person visit   Subjective:   Glenn [name] is a 42 y.o. year old very pleasant male patient who presents for/with See problem oriented charting Chief Complaint  Patient presents with   Medical Management of Chronic Issues   Hypertension    Pt here for 4 month f/u on HTN   Gastroesophageal Reflux    Pt states needs something strong than Pantoprazole    Past Medical History-  Patient Active Problem List   Diagnosis Date Noted   Diabetes mellitus without complication (HCC) 12/02/2022    Priority: High   Substance abuse in remission (HCC) 11/23/2021    Priority: High   Pancreatic pseudocyst 08/05/2020    Priority: High   Acute hepatitis 07/27/2020    Priority: High   Hepatic steatosis 02/11/2018    Priority: High   Alcohol use disorder in remission 02/11/2018    Priority: High   Rosacea 10/13/2020    Priority: Medium    Chronic fatigue 10/28/2018    Priority: Medium    GAD (generalized anxiety disorder) 07/02/2018    Priority: Medium    Major depression in full remission (HCC) 07/02/2018    Priority: Medium    Tobacco abuse 02/24/2015    Priority: Medium    Essential hypertension, benign 02/18/2014    Priority: Medium    Panic disorder 12/10/2013    Priority: Medium    Baker's cyst of knee, right 10/27/2020    Priority: Low   Patellofemoral pain syndrome of right knee 10/27/2020    Priority: Low   Elevated ferritin 08/05/2020    Priority: Low   Acute pain of right knee 05/14/2018    Priority: Low   Elevated uric acid in blood 03/16/2018    Priority: Low   Pancreatitis, alcoholic, acute 02/11/2018    Priority: Low   History of pancreatitis 01/22/2022   Abnormal CT of the abdomen 01/22/2022    Medications- reviewed and updated Current Outpatient Medications  Medication Sig Dispense Refill   Blood Glucose Monitoring Suppl (FREESTYLE LITE) w/Device KIT Use to check blood sugars daily. Dx: E11.9 1 kit KIT   Buprenorphine HCl-Naloxone HCl  8-2 MG FILM SMARTSIG:2.5 Strip(s) Sublingual Daily     Continuous Glucose Sensor (DEXCOM G7 SENSOR) MISC 1 Device by Does not apply route continuous. 9 each 0   esomeprazole (NEXIUM) 40 MG packet Take 40 mg by mouth daily before breakfast. 30 each 12   glucose blood (FREESTYLE LITE) test strip Use to test blood sugars daily. Dx: E11.9 100 each 12   insulin degludec (TRESIBA FLEXTOUCH) 100 UNIT/ML FlexTouch Pen Inject 10-20 Units into the skin daily. (Patient taking differently: Inject 10-20 Units into the skin daily. 25 units in the AM) 9 mL 5   Insulin Pen Needle 31G X 8 MM MISC 1 each by Does not apply route daily. 90 each 11   losartan-hydrochlorothiazide (HYZAAR) 100-25 MG tablet Take 1 tablet by mouth daily. 90 tablet 3   metFORMIN (GLUCOPHAGE-XR) 500 MG 24 hr tablet Take 2 tablets (1,000 mg total) by mouth 2 (two) times daily with a meal. 180 tablet 3   naloxone (NARCAN) nasal spray 4 mg/0.1 mL Use as directed for overdose. 1 each 1   rosuvastatin (CRESTOR) 10 MG tablet Take 1 tablet (10 mg total) by mouth daily. 90 tablet 3   varenicline (CHANTIX CONTINUING MONTH PAK) 1 MG tablet Take 1 tablet (1 mg total) by mouth 2 (two) times daily. 60 tablet 1   varenicline (CHANTIX) 0.5  MG tablet 1 po qd for 3 days, 1 po bid for 3 days, then 2 po bid 120 tablet 0   diazepam (VALIUM) 10 MG tablet Take 1 tablet (10 mg total) by mouth 2 (two) times daily as needed for anxiety (panic). (Patient not taking: Reported on 04/03/2023) 60 tablet 2   No current facility-administered medications for this visit.     Objective:  BP 118/78   Pulse 96   Temp 98 F (36.7 C)   Ht 5\' 8"  (1.727 m)   Wt 220 lb 12.8 oz (100.2 kg)   SpO2 98%   BMI 33.57 kg/m  Gen: NAD, resting comfortably CV: RRR no murmurs rubs or gallops Lungs: CTAB no crackles, wheeze, rhonchi Abdomen: soft/nontender/nondistended/normal bowel sounds. No rebound or guarding.  Ext: no edema Skin: warm, dry     Assessment and Plan   #  Pancreatic pseudocyst (originally noted 2021)-patient followed by gastroenterology Dr. Meridee Score with last visit 01/18/2022 with plan for 1 year follow-up with repeat MRI/MRCP-had improved on follow-up imaging.  Also history of pancreatitis likely caused by alcohol consumption which led to pseudocyst formation. - Encouraged to abstain from alcohol- about a 2 a day right now but up to 4 on weekend. If on call doesn't drink on weekends- encouraged full cessation -  update CT this year at [facility] imaging as much more affordable for him with no facility fee  # Diabetes S: Medication: Metformin 1000 mg most days just once a day but prescribed this twice a day-probably stomach some-endocrinology eventually changed him to extended release.  At last visit we discussed starting insulin-and ultimately referred him to endocrinology-he is now on Tresiba at 25 units in the morning  CBGs- below 140 in am and around 109 or so around lunch -much less fatigued after starting insulin Lab Results  Component Value Date   HGBA1C 12.5 (H) 12/02/2022   HGBA1C 10.0 (H) 11/26/2021   A/P: sounds like much improved- check a1c when he comes back- continue current medications    #hypertension S: medication: Losartan hydrochlorothiazide 100-25 mg daily BP Readings from Last 3 Encounters:  04/03/23 118/78  02/16/23 (!) 130/90  02/02/23 120/76  A/P: stable- continue current medicines   #hyperlipidemia S: Medication: Rosuvastatin 10 mg daily Lab Results  Component Value Date   CHOL 189 12/02/2022   HDL 42.40 12/02/2022   LDLCALC 107 (H) 07/20/2016   LDLDIRECT 110.0 12/02/2022   TRIG 244.0 (H) 12/02/2022   CHOLHDL 4 12/02/2022   A/P: hopefully improved-update direct LDL with labs and with prior liver issues possible fatty liver- also important to monitor LFTs   # GERD S:Medication: Pantoprazole 40 mg not always effective but was using intermittently B12 levels related to PPI use: Lab Results  Component Value Date    VITAMINB12 409 12/02/2022  A/P: reflux not always well controlled but was doing the pantoprazole just as needed- we discussed trying Nexium/esomeprazole 40 mg but taking daily before either breakfast or dinner    # Drug abuse-patient states in full remission on Suboxone through [facility].    # Hepatic steatosis-advised alcohol cessation once again importance of weight loss-unfortunately weight up 10 pounds from last visit- walking is down some from job change- advised working on lifestyle Wt Readings from Last 3 Encounters:  04/03/23 220 lb 12.8 oz (100.2 kg)  02/16/23 214 lb 12.8 oz (97.4 kg)  02/02/23 213 lb 9.6 oz (96.9 kg)    Recommended follow up: Return in about 8 months (around 12/02/2023) for  physical or sooner if needed.Schedule b4 you leave. Future Appointments  Date Time Provider Department Center  04/13/2023  2:30 PM Cherie Ouch, PA-C [facility] None  06/01/2023  3:00 PM Altamese Prosser, MD [facility] None    Lab/Order associations:   ICD-10-CM   1. Diabetes mellitus without complication (HCC)  E11.9 Hemoglobin A1c    2. Substance abuse in remission (HCC)  F19.11     3. Pancreatic pseudocyst  K86.3 CT ABDOMEN PELVIS W WO CONTRAST    4. Essential hypertension, benign  I10     5. Hyperlipidemia associated with type 2 diabetes mellitus (HCC)  E11.69 Comprehensive metabolic panel   Y78.2 Direct LDL    6. Alcohol-induced acute pancreatitis without infection or necrosis  K85.20 CT ABDOMEN PELVIS W WO CONTRAST      Meds ordered this encounter  Medications   esomeprazole (NEXIUM) 40 MG packet    Sig: Take 40 mg by mouth daily before breakfast.    Dispense:  30 each    Refill:  12    Return precautions advised.  Tana Conch, MD

## 2023-04-03 NOTE — Addendum Note (Signed)
Addended by: Gwenette Greet on: 04/03/2023 03:03 PM   Modules accepted: Orders

## 2023-04-07 ENCOUNTER — Other Ambulatory Visit: Payer: 59

## 2023-04-08 ENCOUNTER — Other Ambulatory Visit: Payer: Self-pay | Admitting: Family Medicine

## 2023-04-13 ENCOUNTER — Encounter: Payer: Self-pay | Admitting: Physician Assistant

## 2023-04-13 ENCOUNTER — Ambulatory Visit: Payer: 59 | Admitting: Physician Assistant

## 2023-04-13 DIAGNOSIS — F1121 Opioid dependence, in remission: Secondary | ICD-10-CM

## 2023-04-13 DIAGNOSIS — F17200 Nicotine dependence, unspecified, uncomplicated: Secondary | ICD-10-CM | POA: Diagnosis not present

## 2023-04-13 DIAGNOSIS — F411 Generalized anxiety disorder: Secondary | ICD-10-CM

## 2023-04-13 NOTE — Progress Notes (Signed)
[facility]  Patient ID: Glenn [name],  MRN: [id number]  PCP: Shelva Majestic, MD  Date of Evaluation: 04/13/2023 Time spent:20 minutes  Chief Complaint:  Chief Complaint   Follow-up    HISTORY/CURRENT STATUS: HPI For routine med check.    The Chantix didn't work out. Caused n/v, anxiety and depression. He stopped it about a week ago and feels better. Is back to dipping as much as he was before. He still really wants to quit. Doesn't want to try Wellbutrin d/t seizure risk, has never had one but can't afford to risk it with his job.   Patient is able to enjoy things.  Energy and motivation are good.  Work is going well, got a promotion!  No extreme sadness, tearfulness, or feelings of hopelessness.  Sleeps well. ADLs and personal hygiene are normal.   Denies any changes in concentration, making decisions, or remembering things.  Appetite has not changed.  Weight is stable.  Denies suicidal or homicidal thoughts.  Anxiety has been worse since taking the Chantix. He even had to use Valium a few times b/c it was so bad. He rarely takes it and hasn't had it filled since 2022.   Denies dizziness, syncope, seizures, numbness, tingling, tremor, tics, unsteady gait, slurred speech, confusion. Denies muscle or joint pain, stiffness, or dystonia.  Individual Medical History/ Review of Systems: Changes? :No     Past medications for mental health diagnoses include: Effexor caused tremor, Nortriptylline  Allergies: Chantix [varenicline], Gabapentin, and Morphine and codeine  Current Medications:  Current Outpatient Medications:    Blood Glucose Monitoring Suppl (FREESTYLE LITE) w/Device KIT, Use to check blood sugars daily. Dx: E11.9, Disp: 1 kit, Rfl: KIT   Buprenorphine HCl-Naloxone HCl 8-2 MG FILM, SMARTSIG:2.5 Strip(s) Sublingual Daily, Disp: , Rfl:    Continuous Glucose Sensor (DEXCOM G7 SENSOR) MISC, 1 Device by Does not apply route continuous., Disp: 9 each, Rfl: 0    diazepam (VALIUM) 10 MG tablet, Take 1 tablet (10 mg total) by mouth 2 (two) times daily as needed for anxiety (panic)., Disp: 60 tablet, Rfl: 2   glucose blood (FREESTYLE LITE) test strip, Use to test blood sugars daily. Dx: E11.9, Disp: 100 each, Rfl: 12   insulin degludec (TRESIBA FLEXTOUCH) 100 UNIT/ML FlexTouch Pen, Inject 10-20 Units into the skin daily. (Patient taking differently: Inject 10-20 Units into the skin daily. 25 units in the AM), Disp: 9 mL, Rfl: 5   Insulin Pen Needle 31G X 8 MM MISC, 1 each by Does not apply route daily., Disp: 90 each, Rfl: 11   losartan-hydrochlorothiazide (HYZAAR) 100-25 MG tablet, Take 1 tablet by mouth daily., Disp: 90 tablet, Rfl: 3   metFORMIN (GLUCOPHAGE-XR) 500 MG 24 hr tablet, Take 2 tablets (1,000 mg total) by mouth 2 (two) times daily with a meal., Disp: 180 tablet, Rfl: 3   naloxone (NARCAN) nasal spray 4 mg/0.1 mL, Use as directed for overdose., Disp: 1 each, Rfl: 1   omeprazole (PRILOSEC) 40 MG capsule, Take 1 capsule (40 mg total) by mouth daily., Disp: 90 capsule, Rfl: 3   rosuvastatin (CRESTOR) 10 MG tablet, TAKE 1 TABLET(10 MG) BY MOUTH DAILY, Disp: 30 tablet, Rfl: 5 Medication Side Effects: none  Family Medical/ Social History: Changes?  No  MENTAL HEALTH EXAM:  There were no vitals taken for this visit.There is no height or weight on file to calculate BMI.  General Appearance: Casual, Neat and Well Groomed  Eye Contact:  Good  Speech:  Clear  and Coherent and Normal Rate  Volume:  Normal  Mood:  Euthymic  Affect:  Congruent  Thought Process:  Goal Directed and Descriptions of Associations: Circumstantial  Orientation:  Full (Time, Place, and Person)  Thought Content: Logical   Suicidal Thoughts:  No  Homicidal Thoughts:  No  Memory:  WNL  Judgement:  Good  Insight:  Good  Psychomotor Activity:  Normal  Concentration:  Concentration: Good and Attention Span: Good  Recall:  Good  Fund of Knowledge: Good  Language: Good   Assets:  Communication Skills Desire for Improvement Financial Resources/Insurance Housing Resilience Transportation Vocational/Educational  ADL's:  Intact  Cognition: WNL  Prognosis:  Good   DIAGNOSES:    ICD-10-CM   1. Tobacco use disorder, moderate, dependence  F17.200     2. GAD (generalized anxiety disorder)  F41.1     3. Opioid use disorder, moderate, in sustained remission, on maintenance therapy (HCC)  F11.21       Receiving Psychotherapy: No   RECOMMENDATIONS:  PDMP was reviewed.  Suboxone film filled 03/28/2023. Valium isn't listed, states he thinks it was filled in Aug 2022.  I provided 20 minutes of face to face time during this encounter, including time spent before and after the visit in records review, medical decision making, counseling pertinent to today's visit, and charting.   Tobacco cessation discussed. Again talked about Wellbutrin, but I understand his concern for a seizure, he doesn't have a h/o but with his job for the [place] and having to drive, he can't afford to have a seizure. Recommend Nicoderm 21 mcg for several weeks, can use nicotine gum or losenge occas if really needs a 'hit' of nicotine. Then decrease Nicoderm dose as discussed. He will try.   Continue Valium 10 mg, bid prn (he has an old Rx.) Continue Suboxone per another provider. Return in 4 months.  Melony Overly, PA-C

## 2023-04-14 ENCOUNTER — Other Ambulatory Visit (INDEPENDENT_AMBULATORY_CARE_PROVIDER_SITE_OTHER): Payer: 59

## 2023-04-14 DIAGNOSIS — E785 Hyperlipidemia, unspecified: Secondary | ICD-10-CM

## 2023-04-14 DIAGNOSIS — E119 Type 2 diabetes mellitus without complications: Secondary | ICD-10-CM | POA: Diagnosis not present

## 2023-04-14 DIAGNOSIS — E1169 Type 2 diabetes mellitus with other specified complication: Secondary | ICD-10-CM | POA: Diagnosis not present

## 2023-04-14 LAB — COMPREHENSIVE METABOLIC PANEL
ALT: 27 U/L (ref 0–53)
AST: 25 U/L (ref 0–37)
Albumin: 4.6 g/dL (ref 3.5–5.2)
Alkaline Phosphatase: 53 U/L (ref 39–117)
BUN: 20 mg/dL (ref 6–23)
CO2: 28 mEq/L (ref 19–32)
Calcium: 9.4 mg/dL (ref 8.4–10.5)
Chloride: 97 mEq/L (ref 96–112)
Creatinine, Ser: 1.15 mg/dL (ref 0.40–1.50)
GFR: 78.86 mL/min (ref >60.00)
Glucose, Bld: 140 mg/dL — ABNORMAL HIGH (ref 70–99)
Potassium: 3.8 mEq/L (ref 3.5–5.1)
Sodium: 133 mEq/L — ABNORMAL LOW (ref 135–145)
Total Bilirubin: 0.6 mg/dL (ref 0.2–1.2)
Total Protein: 7.3 g/dL (ref 6.0–8.3)

## 2023-04-14 LAB — HEMOGLOBIN A1C: Hgb A1c MFr Bld: 8.2 % — ABNORMAL HIGH (ref 4.6–6.5)

## 2023-04-14 LAB — LDL CHOLESTEROL, DIRECT: Direct LDL: 58 mg/dL

## 2023-04-20 ENCOUNTER — Other Ambulatory Visit: Payer: 59

## 2023-05-08 ENCOUNTER — Ambulatory Visit
Admission: RE | Admit: 2023-05-08 | Discharge: 2023-05-08 | Disposition: A | Discharge status: Home / Self Care | Payer: 59 | Source: Ambulatory Visit | Attending: Family Medicine | Admitting: Family Medicine

## 2023-05-08 DIAGNOSIS — K863 Pseudocyst of pancreas: Secondary | ICD-10-CM

## 2023-05-08 DIAGNOSIS — K852 Alcohol induced acute pancreatitis without necrosis or infection: Secondary | ICD-10-CM

## 2023-05-08 MED ORDER — IOPAMIDOL (ISOVUE-300) INJECTION 61%
100.0000 mL | Freq: Once | INTRAVENOUS | Status: AC | PRN
  Administered 2023-05-08: 100 mL via INTRAVENOUS

## 2023-05-09 ENCOUNTER — Encounter: Payer: Self-pay | Admitting: Gastroenterology

## 2023-06-01 ENCOUNTER — Ambulatory Visit: Payer: 59 | Admitting: "Endocrinology

## 2023-06-01 ENCOUNTER — Encounter: Payer: Self-pay | Admitting: "Endocrinology

## 2023-06-01 VITALS — BP 100/73 | HR 75 | Ht 68.0 in | Wt 223.4 lb

## 2023-06-01 DIAGNOSIS — E782 Mixed hyperlipidemia: Secondary | ICD-10-CM

## 2023-06-01 DIAGNOSIS — E1165 Type 2 diabetes mellitus with hyperglycemia: Secondary | ICD-10-CM | POA: Diagnosis not present

## 2023-06-01 DIAGNOSIS — Z7984 Long term (current) use of oral hypoglycemic drugs: Secondary | ICD-10-CM

## 2023-06-01 DIAGNOSIS — Z794 Long term (current) use of insulin: Secondary | ICD-10-CM | POA: Diagnosis not present

## 2023-06-01 MED ORDER — TRESIBA FLEXTOUCH 100 UNIT/ML ~~LOC~~ SOPN: 27.0000 [IU] | PEN_INJECTOR | Freq: Every day | SUBCUTANEOUS | 3 refills | Status: AC

## 2023-06-01 MED ORDER — GVOKE HYPOPEN 1-PACK 1 MG/0.2ML ~~LOC~~ SOAJ: 1.0000 mg | SUBCUTANEOUS | 2 refills | Status: AC | PRN

## 2023-06-01 NOTE — Patient Instructions (Addendum)
Will recommend the following: Metformin XR 500 mg 2 pills BID, reinforced compliance (take all 4 pills in morning) Tresiba 27 units everyday if fasting BG >120 after 2 weeks
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
## 2023-06-26 ENCOUNTER — Encounter: Payer: Self-pay | Admitting: Family Medicine

## 2023-06-30 ENCOUNTER — Ambulatory Visit: Payer: 59 | Admitting: Physician Assistant

## 2023-06-30 ENCOUNTER — Encounter: Payer: Self-pay | Admitting: Physician Assistant

## 2023-06-30 DIAGNOSIS — F411 Generalized anxiety disorder: Secondary | ICD-10-CM

## 2023-06-30 DIAGNOSIS — F410 Panic disorder [episodic paroxysmal anxiety] without agoraphobia: Secondary | ICD-10-CM

## 2023-06-30 MED ORDER — BUSPIRONE HCL 15 MG PO TABS: ORAL_TABLET | ORAL | 1 refills | Status: DC

## 2023-06-30 MED ORDER — DIAZEPAM 10 MG PO TABS: 5.0000 mg | ORAL_TABLET | Freq: Two times a day (BID) | ORAL | 1 refills | Status: DC | PRN

## 2023-06-30 NOTE — Progress Notes (Signed)
[facility]  Patient ID: Glenn [name],  MRN: [id number]  PCP: Shelva Majestic, MD  Date of Evaluation: 06/30/2023 Time spent:25 minutes  Chief Complaint:  Chief Complaint   Anxiety    HISTORY/CURRENT STATUS: HPI For routine med check.    More anxious for the past few months.  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  He felt trapped because he could not leave.  His heart was racing some and he felt sweaty especially in his palms.  No shortness of breath.  He has taken Valium in the past for this, probably 2 years ago was the last time he took it.  The panic attacks occur a couple of times a week, but he is anxious on a daily basis.  Feels overwhelmed with a sense of dread.  Patient is able to enjoy things.  Energy and motivation are good.  No extreme sadness, tearfulness, or feelings of hopelessness.  Sleeps well.  ADLs and personal hygiene are normal.   Denies any changes in concentration, making decisions, or remembering things.  Appetite has not changed.  Weight is stable.  Denies suicidal or homicidal thoughts.  Patient denies increased energy with decreased need for sleep, increased talkativeness, racing thoughts, impulsivity or risky behaviors, increased spending, increased libido, grandiosity, increased irritability or anger, paranoia, or hallucinations.  Denies dizziness, syncope, seizures, numbness, tingling, tremor, tics, unsteady gait, slurred speech, confusion. Denies muscle or joint pain, stiffness, or dystonia.  Individual Medical History/ Review of Systems: Changes? :No     Past medications for mental health diagnoses include: Effexor caused tremor, Nortriptylline  Allergies: Chantix [varenicline], Gabapentin, and Morphine and codeine  Current Medications:  Current Outpatient Medications:    Blood Glucose Monitoring Suppl  (FREESTYLE LITE) w/Device KIT, Use to check blood sugars daily. Dx: E11.9, Disp: 1 kit, Rfl: KIT   Buprenorphine HCl-Naloxone HCl 8-2 MG FILM, SMARTSIG:2.5 Strip(s) Sublingual Daily, Disp: , Rfl:    busPIRone (BUSPAR) 15 MG tablet, /3 tablet twice daily for 1 week, then increase to 2/3 tablet twice daily for 1 week, then increase to 1 tablet twice daily for anxiety., Disp: 60 tablet, Rfl: 1   Glucagon (GVOKE HYPOPEN 1-PACK) 1 MG/0.2ML SOAJ, Inject 1 mg into the skin as needed (low blood sugar with impaired consciousness)., Disp: 0.4 mL, Rfl: 2   glucose blood (FREESTYLE LITE) test strip, Use to test blood sugars daily. Dx: E11.9, Disp: 100 each, Rfl: 12   insulin degludec (TRESIBA FLEXTOUCH) 100 UNIT/ML FlexTouch Pen, Inject 27 Units into the skin daily., Disp: 9 mL, Rfl: 3   Insulin Pen Needle 31G X 8 MM MISC, 1 each by Does not apply route daily., Disp: 90 each, Rfl: 11   losartan-hydrochlorothiazide (HYZAAR) 100-25 MG tablet, Take 1 tablet by mouth daily., Disp: 90 tablet, Rfl: 3   metFORMIN (GLUCOPHAGE-XR) 500 MG 24 hr tablet, Take 2 tablets (1,000 mg total) by mouth 2 (two) times daily with a meal., Disp: 180 tablet, Rfl: 3   omeprazole (PRILOSEC) 40 MG capsule, Take 1 capsule (40 mg total) by mouth daily., Disp: 90 capsule, Rfl: 3   rosuvastatin (CRESTOR) 10 MG tablet, TAKE 1 TABLET(10 MG) BY MOUTH DAILY, Disp: 30 tablet, Rfl: 5   Continuous Glucose Sensor (DEXCOM G7 SENSOR) MISC, 1 Device by Does not apply route continuous. (Patient not taking: Reported on 06/01/2023),  Disp: 9 each, Rfl: 0   diazepam (VALIUM) 10 MG tablet, Take 0.5-1 tablets (5-10 mg total) by mouth 2 (two) times daily as needed for anxiety (panic)., Disp: 60 tablet, Rfl: 1   naloxone (NARCAN) nasal spray 4 mg/0.1 mL, Use as directed for overdose. (Patient not taking: Reported on 06/30/2023), Disp: 1 each, Rfl: 1 Medication Side Effects: none  Family Medical/ Social History: Changes?  No  MENTAL HEALTH EXAM:  There were no  vitals taken for this visit.There is no height or weight on file to calculate BMI.  General Appearance: Casual, Neat and Well Groomed  Eye Contact:  Good  Speech:  Clear and Coherent and Normal Rate  Volume:  Normal  Mood:  Anxious  Affect:  Congruent  Thought Process:  Goal Directed and Descriptions of Associations: Circumstantial  Orientation:  Full (Time, Place, and Person)  Thought Content: Logical   Suicidal Thoughts:  No  Homicidal Thoughts:  No  Memory:  WNL  Judgement:  Good  Insight:  Good  Psychomotor Activity:  Normal  Concentration:  Concentration: Good and Attention Span: Good  Recall:  Good  Fund of Knowledge: Good  Language: Good  Assets:  Communication Skills Desire for Improvement Financial Resources/Insurance Housing Resilience Transportation Vocational/Educational  ADL's:  Intact  Cognition: WNL  Prognosis:  Good   DIAGNOSES:    ICD-10-CM   1. Panic disorder  F41.0 diazepam (VALIUM) 10 MG tablet    2. GAD (generalized anxiety disorder)  F41.1 diazepam (VALIUM) 10 MG tablet     Receiving Psychotherapy: No   RECOMMENDATIONS:  PDMP was reviewed.  Suboxone film filled 06/30/2023. I provided 25 minutes of face to face time during this encounter, including time spent before and after the visit in records review, medical decision making, counseling pertinent to today's visit, and charting.   We discussed the anxiety prevention and treatment options.  He is not depressed at all and has been on several different antidepressants in the past that caused intolerable side effects, so adding an antidepressant is not appropriate.  I recommend starting BuSpar.  Benefits, risk and side effects were discussed and he accepts. Will restart Valium but take as little as possible.  Start BuSpar 15 mg 1/3 tablet twice daily for 1 week, then increase to 2/3 tablet twice daily for 1 week, then increase to 1 tablet twice daily for anxiety. Continue Valium 10 mg, 1/2-1 p.o.  twice daily as needed.   Continue Suboxone per another provider. Return in 6 weeks.  Melony Overly, PA-C

## 2023-07-12 ENCOUNTER — Encounter: Payer: Self-pay | Admitting: Psychiatry

## 2023-07-14 ENCOUNTER — Other Ambulatory Visit: Payer: Self-pay | Admitting: Family Medicine

## 2023-07-14 DIAGNOSIS — Z0001 Encounter for general adult medical examination with abnormal findings: Secondary | ICD-10-CM

## 2023-07-17 ENCOUNTER — Telehealth: Payer: Self-pay | Admitting: Family Medicine

## 2023-07-17 NOTE — Telephone Encounter (Signed)
Patient dropped off document  [employer]  , to be filled out by provider. Patient requested to send it back via Fax within 5-days. Document is located in providers tray at front office.Please advise at Mobile [phone] (mobile)

## 2023-07-18 NOTE — Telephone Encounter (Signed)
Forms completed and faxed back.

## 2023-07-28 ENCOUNTER — Other Ambulatory Visit: Payer: Self-pay | Admitting: Physician Assistant

## 2023-07-31 NOTE — Telephone Encounter (Signed)
Has anyone seen this paperwork?

## 2023-07-31 NOTE — Telephone Encounter (Signed)
 This was faxed on 07/18/23.

## 2023-08-07 ENCOUNTER — Encounter: Payer: Self-pay | Admitting: Family Medicine

## 2023-08-11 ENCOUNTER — Ambulatory Visit: Payer: 59 | Admitting: Physician Assistant

## 2023-08-17 ENCOUNTER — Encounter: Payer: Self-pay | Admitting: Physician Assistant

## 2023-08-17 ENCOUNTER — Ambulatory Visit (INDEPENDENT_AMBULATORY_CARE_PROVIDER_SITE_OTHER): Payer: 59 | Admitting: Physician Assistant

## 2023-08-17 DIAGNOSIS — F3341 Major depressive disorder, recurrent, in partial remission: Secondary | ICD-10-CM

## 2023-08-17 DIAGNOSIS — F411 Generalized anxiety disorder: Secondary | ICD-10-CM | POA: Diagnosis not present

## 2023-08-17 DIAGNOSIS — F1121 Opioid dependence, in remission: Secondary | ICD-10-CM

## 2023-08-17 DIAGNOSIS — F410 Panic disorder [episodic paroxysmal anxiety] without agoraphobia: Secondary | ICD-10-CM | POA: Diagnosis not present

## 2023-08-17 MED ORDER — BUSPIRONE HCL 15 MG PO TABS: 22.5000 mg | ORAL_TABLET | Freq: Two times a day (BID) | ORAL | 1 refills | Status: DC

## 2023-08-17 NOTE — Progress Notes (Signed)
[facility]  Patient ID: Glenn [name],  MRN: [id number]  PCP: Shelva Majestic, MD  Date of Evaluation: 08/17/2023 Time spent:20 minutes  Chief Complaint:  Chief Complaint   Anxiety; Follow-up    HISTORY/CURRENT STATUS: HPI For routine med check.    Six weeks ago, we added Buspar.  It's been very helpful, but still gets overwhelmed at times and feels anxious. Not having PA, but feels like it could turn into one sometimes.   Patient is able to enjoy things.  Energy and motivation are good.  Work is going well.   No extreme sadness, tearfulness, or feelings of hopelessness.  Sleeps well most of the time. ADLs and personal hygiene are normal.   Denies any changes in concentration, making decisions, or remembering things.  Appetite has not changed.  Weight is stable.  Denies suicidal or homicidal thoughts.  Patient denies increased energy with decreased need for sleep, increased talkativeness, racing thoughts, impulsivity or risky behaviors, increased spending, increased libido, grandiosity, increased irritability or anger, paranoia, or hallucinations.  Denies dizziness, syncope, seizures, numbness, tingling, tremor, tics, unsteady gait, slurred speech, confusion. Denies muscle or joint pain, stiffness, or dystonia.  Individual Medical History/ Review of Systems: Changes? :No     Past medications for mental health diagnoses include: Effexor caused tremor, Nortriptylline  Allergies: Chantix [varenicline], Gabapentin, and Morphine and codeine  Current Medications:  Current Outpatient Medications:    Buprenorphine HCl-Naloxone HCl 8-2 MG FILM, SMARTSIG:2.5 Strip(s) Sublingual Daily, Disp: , Rfl:    diazepam (VALIUM) 10 MG tablet, Take 0.5-1 tablets (5-10 mg total) by mouth 2 (two) times daily as needed for anxiety (panic)., Disp: 60 tablet, Rfl: 1   Glucagon (GVOKE HYPOPEN 1-PACK) 1 MG/0.2ML SOAJ, Inject 1 mg into the skin as needed (low blood sugar with impaired  consciousness)., Disp: 0.4 mL, Rfl: 2   glucose blood (FREESTYLE LITE) test strip, Use to test blood sugars daily. Dx: E11.9, Disp: 100 each, Rfl: 12   Insulin Pen Needle 31G X 8 MM MISC, 1 each by Does not apply route daily., Disp: 90 each, Rfl: 11   losartan-hydrochlorothiazide (HYZAAR) 100-25 MG tablet, Take 1 tablet by mouth daily., Disp: 90 tablet, Rfl: 3   metFORMIN (GLUCOPHAGE-XR) 500 MG 24 hr tablet, TAKE 2 TABLETS(1000 MG) BY MOUTH TWICE DAILY WITH A MEAL, Disp: 180 tablet, Rfl: 3   omeprazole (PRILOSEC) 40 MG capsule, Take 1 capsule (40 mg total) by mouth daily., Disp: 90 capsule, Rfl: 3   rosuvastatin (CRESTOR) 10 MG tablet, TAKE 1 TABLET(10 MG) BY MOUTH DAILY, Disp: 30 tablet, Rfl: 5   Blood Glucose Monitoring Suppl (FREESTYLE LITE) w/Device KIT, Use to check blood sugars daily. Dx: E11.9, Disp: 1 kit, Rfl: KIT   busPIRone (BUSPAR) 15 MG tablet, Take 1.5 tablets (22.5 mg total) by mouth 2 (two) times daily., Disp: 270 tablet, Rfl: 1   Continuous Glucose Sensor (DEXCOM G7 SENSOR) MISC, 1 Device by Does not apply route continuous. (Patient not taking: Reported on 08/17/2023), Disp: 9 each, Rfl: 0   naloxone (NARCAN) nasal spray 4 mg/0.1 mL, Use as directed for overdose. (Patient not taking: Reported on 08/17/2023), Disp: 1 each, Rfl: 1 Medication Side Effects: none  Family Medical/ Social History: Changes?  No  MENTAL HEALTH EXAM:  There were no vitals taken for this visit.There is no height or weight on file to calculate BMI.  General Appearance: Casual, Neat and Well Groomed  Eye Contact:  Good  Speech:  Clear and Coherent and Normal  Rate  Volume:  Normal  Mood:  Euthymic  Affect:  Congruent  Thought Process:  Goal Directed and Descriptions of Associations: Circumstantial  Orientation:  Full (Time, Place, and Person)  Thought Content: Logical   Suicidal Thoughts:  No  Homicidal Thoughts:  No  Memory:  WNL  Judgement:  Good  Insight:  Good  Psychomotor Activity:  Normal   Concentration:  Concentration: Good and Attention Span: Good  Recall:  Good  Fund of Knowledge: Good  Language: Good  Assets:  Communication Skills Desire for Improvement Financial Resources/Insurance Housing Resilience Transportation Vocational/Educational  ADL's:  Intact  Cognition: WNL  Prognosis:  Good   DIAGNOSES:    ICD-10-CM   1. GAD (generalized anxiety disorder)  F41.1     2. Panic disorder  F41.0     3. Recurrent major depression in partial remission (HCC)  F33.41     4. Opioid use disorder, moderate, in sustained remission, on maintenance therapy (HCC)  F11.21       Receiving Psychotherapy: No   RECOMMENDATIONS:  PDMP was reviewed.  Suboxone film filled 07/28/2023.  Valium filled 06/30/2023. I provided 20  minutes of face to face time during this encounter, including time spent before and after the visit in records review, medical decision making, counseling pertinent to today's visit, and charting.   I'm glad to see him responding well to the Buspar. I recommend increasing the dose for even more improvement.  He would like to try it.  Increase BuSpar 15 mg to 1.5 pills bid.  Continue Valium 10 mg, 1/2-1 p.o. twice daily as needed.   Continue Suboxone per another provider. Return in 3 months.  Melony Overly, PA-C

## 2023-09-03 ENCOUNTER — Encounter: Payer: Self-pay | Admitting: Family Medicine

## 2023-09-07 ENCOUNTER — Ambulatory Visit: Payer: 59 | Admitting: "Endocrinology

## 2023-10-03 ENCOUNTER — Other Ambulatory Visit: Payer: Self-pay | Admitting: Physician Assistant

## 2023-10-03 DIAGNOSIS — F411 Generalized anxiety disorder: Secondary | ICD-10-CM

## 2023-10-03 DIAGNOSIS — F410 Panic disorder [episodic paroxysmal anxiety] without agoraphobia: Secondary | ICD-10-CM

## 2023-10-03 NOTE — Telephone Encounter (Signed)
Lf 12/18

## 2023-10-12 ENCOUNTER — Ambulatory Visit: Payer: 59 | Admitting: "Endocrinology

## 2023-11-09 ENCOUNTER — Encounter: Payer: Self-pay | Admitting: "Endocrinology

## 2023-11-09 ENCOUNTER — Ambulatory Visit: Admitting: "Endocrinology

## 2023-11-09 VITALS — BP 130/86 | HR 65 | Ht 68.0 in | Wt 220.0 lb

## 2023-11-09 DIAGNOSIS — Z794 Long term (current) use of insulin: Secondary | ICD-10-CM | POA: Diagnosis not present

## 2023-11-09 DIAGNOSIS — E782 Mixed hyperlipidemia: Secondary | ICD-10-CM

## 2023-11-09 DIAGNOSIS — Z7984 Long term (current) use of oral hypoglycemic drugs: Secondary | ICD-10-CM

## 2023-11-09 DIAGNOSIS — E1165 Type 2 diabetes mellitus with hyperglycemia: Secondary | ICD-10-CM | POA: Diagnosis not present

## 2023-11-09 LAB — POCT GLYCOSYLATED HEMOGLOBIN (HGB A1C): Hemoglobin A1C: 6.3 % — AB (ref 4.0–5.6)

## 2023-11-09 MED ORDER — PIOGLITAZONE HCL 15 MG PO TABS: 15.0000 mg | ORAL_TABLET | Freq: Every day | ORAL | 0 refills | Status: DC
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
## 2023-11-10 MED ORDER — TRESIBA FLEXTOUCH 100 UNIT/ML ~~LOC~~ SOPN: 42.0000 [IU] | PEN_INJECTOR | Freq: Every day | SUBCUTANEOUS | 1 refills | Status: DC

## 2023-11-10 NOTE — Addendum Note (Signed)
 Addended byAltamese Pierre Part on: 11/10/2023 08:45 AM   Modules accepted: Orders

## 2023-11-16 ENCOUNTER — Ambulatory Visit: Payer: 59 | Admitting: Physician Assistant

## 2023-11-23 ENCOUNTER — Other Ambulatory Visit: Payer: Self-pay | Admitting: Family Medicine

## 2023-12-04 ENCOUNTER — Ambulatory Visit: Admitting: "Endocrinology

## 2023-12-06 ENCOUNTER — Ambulatory Visit: Admitting: Physician Assistant

## 2023-12-06 ENCOUNTER — Encounter: Payer: 59 | Admitting: Family Medicine

## 2023-12-06 ENCOUNTER — Encounter: Payer: Self-pay | Admitting: Physician Assistant

## 2023-12-06 DIAGNOSIS — F411 Generalized anxiety disorder: Secondary | ICD-10-CM

## 2023-12-06 DIAGNOSIS — F410 Panic disorder [episodic paroxysmal anxiety] without agoraphobia: Secondary | ICD-10-CM

## 2023-12-06 MED ORDER — DIAZEPAM 10 MG PO TABS
10.0000 mg | ORAL_TABLET | Freq: Two times a day (BID) | ORAL | 1 refills | Status: DC | PRN
Start: 2023-12-06 — End: 2024-02-08

## 2023-12-06 MED ORDER — BUSPIRONE HCL 30 MG PO TABS: 30.0000 mg | ORAL_TABLET | Freq: Two times a day (BID) | ORAL | 1 refills | Status: DC

## 2023-12-06 NOTE — Progress Notes (Signed)
 [facility] Check  Patient ID: Glenn [name],  MRN: [id number]  PCP: Almira Jaeger, MD  Date of Evaluation: 12/06/2023 Time spent:20 minutes  Chief Complaint:  Chief Complaint   Anxiety; Follow-up    HISTORY/CURRENT STATUS: HPI For routine med check.    He's doing well for the most part. Patient is able to enjoy things.  Energy and motivation are good.  Work is going well.   No extreme sadness, tearfulness, or feelings of hopelessness.  Sleeps well most of the time. ADLs and personal hygiene are normal.   Denies any changes in concentration, making decisions, or remembering things.  Appetite has not changed.  Weight is stable. Anxiety isn't as well controlled as it could be.  Not having PA, just gets overwhelmed. Buspar has been helpful but not as much now. Valium helps but he tries not to take it much.  Denies suicidal or homicidal thoughts.  Patient denies increased energy with decreased need for sleep, increased talkativeness, racing thoughts, impulsivity or risky behaviors, increased spending, increased libido, grandiosity, increased irritability or anger, paranoia, or hallucinations.  Denies dizziness, syncope, seizures, numbness, tingling, tremor, tics, unsteady gait, slurred speech, confusion. Denies muscle or joint pain, stiffness, or dystonia.  Individual Medical History/ Review of Systems: Changes? :No     Past medications for mental health diagnoses include: Effexor caused tremor, Nortriptylline  Allergies: Chantix [varenicline], Gabapentin, and Morphine and codeine  Current Medications:  Current Outpatient Medications:    Blood Glucose Monitoring Suppl (FREESTYLE LITE) w/Device KIT, Use to check blood sugars daily. Dx: E11.9, Disp: 1 kit, Rfl: KIT   Buprenorphine HCl-Naloxone HCl 8-2 MG FILM, SMARTSIG:2.5 Strip(s) Sublingual Daily, Disp: , Rfl:    busPIRone (BUSPAR) 30 MG tablet, Take 1 tablet (30 mg total) by mouth 2 (two) times daily., Disp: 180 tablet, Rfl:  1   Glucagon (GVOKE HYPOPEN 1-PACK) 1 MG/0.2ML SOAJ, Inject 1 mg into the skin as needed (low blood sugar with impaired consciousness)., Disp: 0.4 mL, Rfl: 2   glucose blood (FREESTYLE LITE) test strip, Use to test blood sugars daily. Dx: E11.9, Disp: 100 each, Rfl: 12   Insulin Pen Needle 31G X 8 MM MISC, 1 each by Does not apply route daily., Disp: 90 each, Rfl: 11   losartan-hydrochlorothiazide (HYZAAR) 100-25 MG tablet, Take 1 tablet by mouth daily., Disp: 90 tablet, Rfl: 3   metFORMIN (GLUCOPHAGE-XR) 500 MG 24 hr tablet, TAKE 2 TABLETS(1000 MG) BY MOUTH TWICE DAILY WITH A MEAL, Disp: 180 tablet, Rfl: 3   omeprazole (PRILOSEC) 40 MG capsule, Take 1 capsule (40 mg total) by mouth daily., Disp: 90 capsule, Rfl: 3   pioglitazone (ACTOS) 15 MG tablet, Take 1 tablet (15 mg total) by mouth daily., Disp: 30 tablet, Rfl: 0   rosuvastatin (CRESTOR) 10 MG tablet, TAKE 1 TABLET(10 MG) BY MOUTH DAILY, Disp: 30 tablet, Rfl: 5   TRESIBA FLEXTOUCH 100 UNIT/ML FlexTouch Pen, Inject 42 Units into the skin daily., Disp: 39 mL, Rfl: 1   Continuous Glucose Sensor (DEXCOM G7 SENSOR) MISC, 1 Device by Does not apply route continuous. (Patient not taking: Reported on 12/06/2023), Disp: 9 each, Rfl: 0   diazepam (VALIUM) 10 MG tablet, Take 1 tablet (10 mg total) by mouth every 12 (twelve) hours as needed for anxiety., Disp: 60 tablet, Rfl: 1   naloxone (NARCAN) nasal spray 4 mg/0.1 mL, Use as directed for overdose. (Patient not taking: Reported on 12/06/2023), Disp: 1 each, Rfl: 1 Medication Side Effects: none  Family Medical/ Social  History: Changes?  No  MENTAL HEALTH EXAM:  There were no vitals taken for this visit.There is no height or weight on file to calculate BMI.  General Appearance: Casual, Neat and Well Groomed  Eye Contact:  Good  Speech:  Clear and Coherent and Normal Rate  Volume:  Normal  Mood:  Euthymic  Affect:  Congruent  Thought Process:  Goal Directed and Descriptions of Associations:  Circumstantial  Orientation:  Full (Time, Place, and Person)  Thought Content: Logical   Suicidal Thoughts:  No  Homicidal Thoughts:  No  Memory:  WNL  Judgement:  Good  Insight:  Good  Psychomotor Activity:  Normal  Concentration:  Concentration: Good and Attention Span: Good  Recall:  Good  Fund of Knowledge: Good  Language: Good  Assets:  Communication Skills Desire for Improvement Financial Resources/Insurance Housing Resilience Transportation Vocational/Educational  ADL's:  Intact  Cognition: WNL  Prognosis:  Good   DIAGNOSES:    ICD-10-CM   1. Panic disorder  F41.0 diazepam (VALIUM) 10 MG tablet    2. GAD (generalized anxiety disorder)  F41.1 diazepam (VALIUM) 10 MG tablet     Receiving Psychotherapy: No   RECOMMENDATIONS:  PDMP was reviewed.  Valium filled 11/11/2023.  Suboxone filled 11/25/2023. I provided 20 minutes of face to face time during this encounter, including time spent before and after the visit in records review, medical decision making, counseling pertinent to today's visit, and charting.   Discussed the anxiety. I recommend increasing the Buspar to max dose. He'd like to try it.   Increase BuSpar to 30 mg bid. Continue Valium 10 mg, 1/2-1 p.o. twice daily as needed.   Continue Suboxone per another provider. Return in 2 months.  Marvia Slocumb, PA-C

## 2023-12-13 ENCOUNTER — Other Ambulatory Visit: Payer: Self-pay | Admitting: "Endocrinology

## 2023-12-13 NOTE — Telephone Encounter (Signed)
 Requested Prescriptions   Pending Prescriptions Disp Refills   pioglitazone (ACTOS) 15 MG tablet [Pharmacy Med Name: PIOGLITAZONE 15MG  TABLETS] 30 tablet 0    Sig: TAKE 1 TABLET(15 MG) BY MOUTH DAILY

## 2023-12-28 ENCOUNTER — Other Ambulatory Visit: Payer: Self-pay | Admitting: Family Medicine

## 2023-12-28 DIAGNOSIS — Z0001 Encounter for general adult medical examination with abnormal findings: Secondary | ICD-10-CM

## 2024-01-16 ENCOUNTER — Other Ambulatory Visit: Payer: Self-pay | Admitting: "Endocrinology

## 2024-01-16 NOTE — Telephone Encounter (Signed)
 Requested Prescriptions   Pending Prescriptions Disp Refills   pioglitazone (ACTOS) 15 MG tablet [Pharmacy Med Name: PIOGLITAZONE 15MG  TABLETS] 30 tablet 0    Sig: TAKE 1 TABLET(15 MG) BY MOUTH DAILY

## 2024-01-23 ENCOUNTER — Encounter: Payer: Self-pay | Admitting: "Endocrinology

## 2024-01-23 ENCOUNTER — Ambulatory Visit: Admitting: "Endocrinology

## 2024-01-23 VITALS — BP 135/85 | HR 91 | Ht 68.0 in | Wt 236.0 lb

## 2024-01-23 DIAGNOSIS — E1165 Type 2 diabetes mellitus with hyperglycemia: Secondary | ICD-10-CM | POA: Diagnosis not present

## 2024-01-23 DIAGNOSIS — E782 Mixed hyperlipidemia: Secondary | ICD-10-CM | POA: Diagnosis not present

## 2024-01-23 DIAGNOSIS — Z794 Long term (current) use of insulin: Secondary | ICD-10-CM | POA: Diagnosis not present

## 2024-01-23 DIAGNOSIS — Z7984 Long term (current) use of oral hypoglycemic drugs: Secondary | ICD-10-CM

## 2024-01-23 MED ORDER — TRESIBA FLEXTOUCH 100 UNIT/ML ~~LOC~~ SOPN: 42.0000 [IU] | PEN_INJECTOR | Freq: Every day | SUBCUTANEOUS | 3 refills | Status: AC

## 2024-01-23 MED ORDER — PIOGLITAZONE HCL 30 MG PO TABS: 30.0000 mg | ORAL_TABLET | Freq: Every day | ORAL | 1 refills | Status: AC

## 2024-01-23 NOTE — Patient Instructions (Signed)
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
## 2024-02-08 ENCOUNTER — Encounter: Payer: Self-pay | Admitting: Physician Assistant

## 2024-02-08 ENCOUNTER — Ambulatory Visit: Admitting: Physician Assistant

## 2024-02-08 DIAGNOSIS — F411 Generalized anxiety disorder: Secondary | ICD-10-CM | POA: Diagnosis not present

## 2024-02-08 DIAGNOSIS — F17200 Nicotine dependence, unspecified, uncomplicated: Secondary | ICD-10-CM | POA: Diagnosis not present

## 2024-02-08 DIAGNOSIS — F410 Panic disorder [episodic paroxysmal anxiety] without agoraphobia: Secondary | ICD-10-CM | POA: Diagnosis not present

## 2024-02-08 DIAGNOSIS — F3341 Major depressive disorder, recurrent, in partial remission: Secondary | ICD-10-CM | POA: Diagnosis not present

## 2024-02-08 DIAGNOSIS — F1121 Opioid dependence, in remission: Secondary | ICD-10-CM

## 2024-02-08 MED ORDER — BUSPIRONE HCL 30 MG PO TABS: 30.0000 mg | ORAL_TABLET | Freq: Two times a day (BID) | ORAL | 1 refills | Status: DC

## 2024-02-08 MED ORDER — DIAZEPAM 10 MG PO TABS
10.0000 mg | ORAL_TABLET | Freq: Two times a day (BID) | ORAL | 5 refills | Status: DC | PRN
Start: 2024-02-08 — End: 2024-03-11

## 2024-02-08 NOTE — Progress Notes (Signed)
 [facility] Check  Patient ID: Glenn [name],  MRN: [id number]  PCP: Glenn Jaeger, MD  Date of Evaluation: 02/08/2024 Time spent:20 minutes  Chief Complaint:  Chief Complaint   Follow-up    HISTORY/CURRENT STATUS: HPI For routine med check.    Glenn [name] is doing well.  He feels that his medications are still effective.  He can still get anxious at times, gets more generalized and not PA.  The BuSpar  helps to prevent it and Valium  helps as a rescue.  Patient is able to enjoy things.  Energy and motivation are good.  Work is going well.   No extreme sadness, tearfulness, or feelings of hopelessness.  Sleeps well most of the time. ADLs and personal hygiene are normal.   Denies any changes in concentration, making decisions, or remembering things.  Appetite has not changed.  Weight is stable.  Denies suicidal or homicidal thoughts.  Denies dizziness, syncope, seizures, numbness, tingling, tremor, tics, unsteady gait, slurred speech, confusion. Denies muscle or joint pain, stiffness, or dystonia.  Individual Medical History/ Review of Systems: Changes? :No     Past medications for mental health diagnoses include: Effexor  caused tremor, Nortriptylline  Allergies: Chantix  [varenicline ], Gabapentin , and Morphine  and codeine  Current Medications:  Current Outpatient Medications:    Blood Glucose Monitoring Suppl (FREESTYLE LITE) w/Device KIT, Use to check blood sugars daily. Dx: E11.9, Disp: 1 kit, Rfl: KIT   Buprenorphine HCl-Naloxone  HCl 8-2 MG FILM, SMARTSIG:2.5 Strip(s) Sublingual Daily, Disp: , Rfl:    Continuous Glucose Sensor (DEXCOM G7 SENSOR) MISC, 1 Device by Does not apply route continuous., Disp: 9 each, Rfl: 0   Glucagon  (GVOKE HYPOPEN  1-PACK) 1 MG/0.2ML SOAJ, Inject 1 mg into the skin as needed (low blood sugar with impaired consciousness)., Disp: 0.4 mL, Rfl: 2   glucose blood (FREESTYLE LITE) test strip, Use to test blood sugars daily. Dx: E11.9, Disp: 100 each, Rfl:  12   Insulin  Pen Needle (B-D ULTRAFINE III SHORT PEN) 31G X 8 MM MISC, USE TO INJECT DAILY AS DIRECTED, Disp: 100 each, Rfl: 3   losartan -hydrochlorothiazide (HYZAAR) 100-25 MG tablet, TAKE 1 TABLET BY MOUTH DAILY, Disp: 90 tablet, Rfl: 3   metFORMIN  (GLUCOPHAGE -XR) 500 MG 24 hr tablet, TAKE 2 TABLETS(1000 MG) BY MOUTH TWICE DAILY WITH A MEAL, Disp: 180 tablet, Rfl: 3   naloxone  (NARCAN ) nasal spray 4 mg/0.1 mL, Use as directed for overdose., Disp: 1 each, Rfl: 1   omeprazole  (PRILOSEC) 40 MG capsule, Take 1 capsule (40 mg total) by mouth daily., Disp: 90 capsule, Rfl: 3   pioglitazone  (ACTOS ) 30 MG tablet, Take 1 tablet (30 mg total) by mouth daily., Disp: 90 tablet, Rfl: 1   rosuvastatin  (CRESTOR ) 10 MG tablet, TAKE 1 TABLET(10 MG) BY MOUTH DAILY, Disp: 30 tablet, Rfl: 5   TRESIBA  FLEXTOUCH 100 UNIT/ML FlexTouch Pen, Inject 42-45 Units into the skin daily., Disp: 41 mL, Rfl: 3   busPIRone  (BUSPAR ) 30 MG tablet, Take 1 tablet (30 mg total) by mouth 2 (two) times daily., Disp: 180 tablet, Rfl: 1   diazepam  (VALIUM ) 10 MG tablet, Take 1 tablet (10 mg total) by mouth every 12 (twelve) hours as needed for anxiety., Disp: 60 tablet, Rfl: 5 Medication Side Effects: none  Family Medical/ Social History: Changes?  No  MENTAL HEALTH EXAM:  There were no vitals taken for this visit.There is no height or weight on file to calculate BMI.  General Appearance: Casual, Neat and Well Groomed  Eye Contact:  Good  Speech:  Clear and Coherent and Normal Rate  Volume:  Normal  Mood:  Euthymic  Affect:  Congruent  Thought Process:  Goal Directed and Descriptions of Associations: Circumstantial  Orientation:  Full (Time, Place, and Person)  Thought Content: Logical   Suicidal Thoughts:  No  Homicidal Thoughts:  No  Memory:  WNL  Judgement:  Good  Insight:  Good  Psychomotor Activity:  Normal  Concentration:  Concentration: Good and Attention Span: Good  Recall:  Good  Fund of Knowledge: Good   Language: Good  Assets:  Communication Skills Desire for Improvement Financial Resources/Insurance Housing Resilience Social Support Transportation Vocational/Educational  ADL's:  Intact  Cognition: WNL  Prognosis:  Good   DIAGNOSES:    ICD-10-CM   1. GAD (generalized anxiety disorder)  F41.1 diazepam  (VALIUM ) 10 MG tablet    2. Panic disorder  F41.0 diazepam  (VALIUM ) 10 MG tablet    3. Recurrent major depression in partial remission (HCC)  F33.41     4. Tobacco use disorder, moderate, dependence  F17.200     5. Opioid use disorder, moderate, in sustained remission, on maintenance therapy (HCC)  F11.21      Receiving Psychotherapy: No   RECOMMENDATIONS:  PDMP was reviewed.  Valium  filled 02/02/2024.  Suboxone filled 01/23/2024. I provided approximately 20 minutes of face to face time during this encounter, including time spent before and after the visit in records review, medical decision making, counseling pertinent to today's visit, and charting.   I am glad to see him doing well on the current treatment.  No changes need to be made.  Briefly discussed cessation of tobacco.  He is not ready to quit but if/when he is and would like help from me he will let me know.  Continue BuSpar  30 mg bid. Continue Valium  10 mg, 1/2-1 p.o. twice daily as needed.   Continue Suboxone per another provider. Return in 6 months.  Marvia Slocumb, PA-C

## 2024-02-25 ENCOUNTER — Other Ambulatory Visit: Payer: Self-pay | Admitting: "Endocrinology

## 2024-03-10 ENCOUNTER — Other Ambulatory Visit: Payer: Self-pay | Admitting: Physician Assistant

## 2024-03-10 ENCOUNTER — Other Ambulatory Visit: Payer: Self-pay | Admitting: Family Medicine

## 2024-03-10 DIAGNOSIS — Z0001 Encounter for general adult medical examination with abnormal findings: Secondary | ICD-10-CM

## 2024-03-10 DIAGNOSIS — F410 Panic disorder [episodic paroxysmal anxiety] without agoraphobia: Secondary | ICD-10-CM

## 2024-03-10 DIAGNOSIS — F411 Generalized anxiety disorder: Secondary | ICD-10-CM

## 2024-04-10 ENCOUNTER — Other Ambulatory Visit: Payer: Self-pay | Admitting: Family Medicine

## 2024-04-15 ENCOUNTER — Other Ambulatory Visit: Payer: Self-pay

## 2024-04-25 ENCOUNTER — Other Ambulatory Visit

## 2024-04-26 LAB — HEMOGLOBIN A1C
Hgb A1c MFr Bld: 6.7 % — ABNORMAL HIGH (ref <5.7)
Mean Plasma Glucose: 146 mg/dL
eAG (mmol/L): 8.1 mmol/L

## 2024-04-26 LAB — COMPREHENSIVE METABOLIC PANEL WITH GFR
AG Ratio: 1.9 (calc) (ref 1.0–2.5)
ALT: 28 U/L (ref 9–46)
AST: 28 U/L (ref 10–40)
Albumin: 4.7 g/dL (ref 3.6–5.1)
Alkaline phosphatase (APISO): 61 U/L (ref 36–130)
BUN: 18 mg/dL (ref 7–25)
CO2: 32 mmol/L (ref 20–32)
Calcium: 9.6 mg/dL (ref 8.6–10.3)
Chloride: 100 mmol/L (ref 98–110)
Creat: 1.17 mg/dL (ref 0.60–1.29)
Globulin: 2.5 g/dL (ref 1.9–3.7)
Glucose, Bld: 108 mg/dL — ABNORMAL HIGH (ref 65–99)
Potassium: 4.2 mmol/L (ref 3.5–5.3)
Sodium: 140 mmol/L (ref 135–146)
Total Bilirubin: 0.5 mg/dL (ref 0.2–1.2)
Total Protein: 7.2 g/dL (ref 6.1–8.1)
eGFR: 80 mL/min/1.73m2 (ref >=60)

## 2024-04-26 LAB — LIPID PANEL
Cholesterol: 115 mg/dL (ref <200)
HDL: 47 mg/dL (ref >=40)
LDL Cholesterol (Calc): 41 mg/dL
Non-HDL Cholesterol (Calc): 68 mg/dL (ref <130)
Total CHOL/HDL Ratio: 2.4 (calc) (ref <5.0)
Triglycerides: 198 mg/dL — ABNORMAL HIGH (ref <150)

## 2024-04-26 LAB — MICROALBUMIN / CREATININE URINE RATIO
Creatinine, Urine: 178 mg/dL (ref 20–320)
Microalb Creat Ratio: 69 mg/g{creat} — ABNORMAL HIGH (ref <30)
Microalb, Ur: 12.2 mg/dL

## 2024-05-01 ENCOUNTER — Ambulatory Visit: Admitting: "Endocrinology

## 2024-05-10 ENCOUNTER — Ambulatory Visit: Admitting: "Endocrinology

## 2024-05-10 ENCOUNTER — Encounter: Payer: Self-pay | Admitting: "Endocrinology

## 2024-05-10 VITALS — BP 112/80 | HR 91 | Ht 68.0 in | Wt 237.0 lb

## 2024-05-10 DIAGNOSIS — Z794 Long term (current) use of insulin: Secondary | ICD-10-CM | POA: Diagnosis not present

## 2024-05-10 DIAGNOSIS — E782 Mixed hyperlipidemia: Secondary | ICD-10-CM

## 2024-05-10 DIAGNOSIS — E119 Type 2 diabetes mellitus without complications: Secondary | ICD-10-CM | POA: Diagnosis not present

## 2024-05-10 DIAGNOSIS — Z7984 Long term (current) use of oral hypoglycemic drugs: Secondary | ICD-10-CM | POA: Diagnosis not present

## 2024-05-10 MED ORDER — TIRZEPATIDE 2.5 MG/0.5ML ~~LOC~~ SOAJ: 2.5000 mg | SUBCUTANEOUS | 1 refills | Status: DC
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
## 2024-05-14 ENCOUNTER — Other Ambulatory Visit: Payer: Self-pay | Admitting: Family Medicine

## 2024-06-05 ENCOUNTER — Ambulatory Visit: Admitting: Family Medicine

## 2024-06-05 ENCOUNTER — Encounter: Payer: Self-pay | Admitting: Family Medicine

## 2024-06-05 ENCOUNTER — Ambulatory Visit: Payer: Self-pay | Admitting: Family Medicine

## 2024-06-05 VITALS — BP 112/68 | HR 75 | Temp 97.5°F | Ht 68.0 in | Wt 235.6 lb

## 2024-06-05 DIAGNOSIS — I10 Essential (primary) hypertension: Secondary | ICD-10-CM | POA: Diagnosis not present

## 2024-06-05 DIAGNOSIS — K863 Pseudocyst of pancreas: Secondary | ICD-10-CM | POA: Diagnosis not present

## 2024-06-05 DIAGNOSIS — Z0000 Encounter for general adult medical examination without abnormal findings: Secondary | ICD-10-CM

## 2024-06-05 DIAGNOSIS — Z7985 Long-term (current) use of injectable non-insulin antidiabetic drugs: Secondary | ICD-10-CM

## 2024-06-05 DIAGNOSIS — E119 Type 2 diabetes mellitus without complications: Secondary | ICD-10-CM

## 2024-06-05 DIAGNOSIS — Z7984 Long term (current) use of oral hypoglycemic drugs: Secondary | ICD-10-CM

## 2024-06-05 DIAGNOSIS — Z794 Long term (current) use of insulin: Secondary | ICD-10-CM

## 2024-06-05 LAB — URINALYSIS, ROUTINE W REFLEX MICROSCOPIC
Hgb urine dipstick: NEGATIVE
Leukocytes,Ua: NEGATIVE
Nitrite: NEGATIVE
RBC / HPF: NONE SEEN (ref 0–?)
Specific Gravity, Urine: >=1.03 — AB (ref 1.000–1.030)
Total Protein, Urine: 30 — AB
Urine Glucose: NEGATIVE
Urobilinogen, UA: 1 (ref 0.0–1.0)
pH: 6 (ref 5.0–8.0)

## 2024-06-05 LAB — CBC WITH DIFFERENTIAL/PLATELET
Basophils Absolute: 0 K/uL (ref 0.0–0.1)
Basophils Relative: 0.4 % (ref 0.0–3.0)
Eosinophils Absolute: 0.3 K/uL (ref 0.0–0.7)
Eosinophils Relative: 2.9 % (ref 0.0–5.0)
HCT: 45.9 % (ref 39.0–52.0)
Hemoglobin: 15.4 g/dL (ref 13.0–17.0)
Lymphocytes Relative: 34.7 % (ref 12.0–46.0)
Lymphs Abs: 3.1 K/uL (ref 0.7–4.0)
MCHC: 33.5 g/dL (ref 30.0–36.0)
MCV: 84.5 fl (ref 78.0–100.0)
Monocytes Absolute: 0.5 K/uL (ref 0.1–1.0)
Monocytes Relative: 5.2 % (ref 3.0–12.0)
Neutro Abs: 5.1 K/uL (ref 1.4–7.7)
Neutrophils Relative %: 56.8 % (ref 43.0–77.0)
Platelets: 259 K/uL (ref 150.0–400.0)
RBC: 5.43 Mil/uL (ref 4.22–5.81)
RDW: 13.4 % (ref 11.5–15.5)
WBC: 9.1 K/uL (ref 4.0–10.5)

## 2024-06-05 LAB — COMPREHENSIVE METABOLIC PANEL WITH GFR
ALT: 23 U/L (ref 0–53)
AST: 22 U/L (ref 0–37)
Albumin: 4.9 g/dL (ref 3.5–5.2)
Alkaline Phosphatase: 61 U/L (ref 39–117)
BUN: 23 mg/dL (ref 6–23)
CO2: 29 meq/L (ref 19–32)
Calcium: 9.4 mg/dL (ref 8.4–10.5)
Chloride: 98 meq/L (ref 96–112)
Creatinine, Ser: 1.4 mg/dL (ref 0.40–1.50)
GFR: 61.78 mL/min (ref >60.00)
Glucose, Bld: 87 mg/dL (ref 70–99)
Potassium: 4.2 meq/L (ref 3.5–5.1)
Sodium: 139 meq/L (ref 135–145)
Total Bilirubin: 0.5 mg/dL (ref 0.2–1.2)
Total Protein: 7.3 g/dL (ref 6.0–8.3)

## 2024-06-05 NOTE — Patient Instructions (Addendum)
 We will call you within two weeks about your referral for pancreatic pseudocyst follow up  through [facility].  Their phone number is [phone].  Please call them if you have not heard in 1-2 weeks  Great job cutting back on dip - encourage full cessation.  Also reduction of alcohol with prior liver stress would be great- you were down to 8 a week last year and that or lower would be a great goal  Please stop by lab before you go If you have mychart- we will send your results within 3 business days of us  receiving them.  If you do not have mychart- we will call you about results within 5 business days of us  receiving them.  *please also note that you will see labs on mychart as soon as they post. I will later go in and write notes on them- will say notes from Dr. Katrinka   No changes today unless labs lead us  to make changes Or if Dr. Dartha wants to consider jardiance for the protein issues in urine  Recommended follow up: Return in about 1 year (around 06/05/2025) for physical or sooner if needed.Schedule b4 you leave.
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
## 2024-06-13 ENCOUNTER — Ambulatory Visit
Admission: RE | Admit: 2024-06-13 | Discharge: 2024-06-13 | Disposition: A | Discharge status: Home / Self Care | Source: Ambulatory Visit | Attending: Family Medicine | Admitting: Family Medicine

## 2024-06-13 DIAGNOSIS — K863 Pseudocyst of pancreas: Secondary | ICD-10-CM

## 2024-07-01 ENCOUNTER — Other Ambulatory Visit: Payer: Self-pay | Admitting: "Endocrinology

## 2024-07-02 NOTE — Telephone Encounter (Signed)
 Requested Prescriptions   Pending Prescriptions Disp Refills   MOUNJARO  2.5 MG/0.5ML Pen [Pharmacy Med Name: MOUNJARO  2.5MG /0.5ML INJ ( 4 PENS)] 2 mL 1    Sig: ADMINISTER 2.5 MG UNDER THE SKIN 1 TIME A WEEK

## 2024-07-18 ENCOUNTER — Ambulatory Visit: Admitting: "Endocrinology

## 2024-07-18 ENCOUNTER — Encounter: Payer: Self-pay | Admitting: "Endocrinology

## 2024-07-18 VITALS — BP 130/80 | HR 65 | Ht 68.0 in | Wt 234.0 lb

## 2024-07-18 DIAGNOSIS — E119 Type 2 diabetes mellitus without complications: Secondary | ICD-10-CM

## 2024-07-18 DIAGNOSIS — Z794 Long term (current) use of insulin: Secondary | ICD-10-CM

## 2024-07-18 DIAGNOSIS — Z7984 Long term (current) use of oral hypoglycemic drugs: Secondary | ICD-10-CM

## 2024-07-18 DIAGNOSIS — E782 Mixed hyperlipidemia: Secondary | ICD-10-CM

## 2024-07-18 DIAGNOSIS — Z7985 Long-term (current) use of injectable non-insulin antidiabetic drugs: Secondary | ICD-10-CM

## 2024-07-18 LAB — POCT GLYCOSYLATED HEMOGLOBIN (HGB A1C): Hemoglobin A1C: 5.9 % — AB (ref 4.0–5.6)

## 2024-07-18 MED ORDER — TIRZEPATIDE 5 MG/0.5ML ~~LOC~~ SOAJ: 5.0000 mg | SUBCUTANEOUS | 0 refills | Status: DC

## 2024-07-18 NOTE — Patient Instructions (Signed)
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
## 2024-07-19 ENCOUNTER — Other Ambulatory Visit: Payer: Self-pay | Admitting: Family Medicine

## 2024-08-09 ENCOUNTER — Ambulatory Visit: Admitting: Physician Assistant

## 2024-08-09 ENCOUNTER — Encounter: Payer: Self-pay | Admitting: Physician Assistant

## 2024-08-09 DIAGNOSIS — F410 Panic disorder [episodic paroxysmal anxiety] without agoraphobia: Secondary | ICD-10-CM

## 2024-08-09 DIAGNOSIS — F411 Generalized anxiety disorder: Secondary | ICD-10-CM

## 2024-08-09 MED ORDER — BUSPIRONE HCL 30 MG PO TABS: 30.0000 mg | ORAL_TABLET | Freq: Two times a day (BID) | ORAL | 1 refills | Status: AC

## 2024-08-09 MED ORDER — ALPRAZOLAM 1 MG PO TABS: 1.0000 mg | ORAL_TABLET | Freq: Two times a day (BID) | ORAL | 2 refills | Status: AC | PRN

## 2024-08-09 NOTE — Progress Notes (Signed)
 [facility]  Patient ID: Glenn [name],  MRN: [id number]  PCP: Katrinka Garnette KIDD, MD  Date of Evaluation: 08/09/2024 Time spent:20 minutes  Chief Complaint:  Chief Complaint   Anxiety; Follow-up    HISTORY/CURRENT STATUS: HPI For routine med check.    The Valium  isn't working like it once did.  He does not take it every day but when he does need it he takes a whole pill and it is not effective.  He gets overwhelmed sometimes and feels like he could get panicky but does not have a full-blown panic attack.  Other than that he is doing well.  Energy and motivation are good.  Work is going well.   No extreme sadness, tearfulness, or feelings of hopelessness.  Sleeps ok.  ADLs and personal hygiene are normal.   Denies any changes in concentration, making decisions, or remembering things.  Appetite has not changed.  Weight is stable.   No mania, delirium, AH/VH.  No SI/HI.  Individual Medical History/ Review of Systems: Changes? :No     Past medications for mental health diagnoses include: Effexor  caused tremor, Nortriptylline, Xanax    Allergies: Chantix  [varenicline ], Gabapentin , and Morphine  and codeine  Current Medications:  Current Outpatient Medications:    ALPRAZolam  (XANAX ) 1 MG tablet, Take 1 tablet (1 mg total) by mouth 2 (two) times daily as needed for anxiety., Disp: 60 tablet, Rfl: 2   Blood Glucose Monitoring Suppl (FREESTYLE LITE) w/Device KIT, Use to check blood sugars daily. Dx: E11.9, Disp: 1 kit, Rfl: KIT   Buprenorphine HCl-Naloxone  HCl 8-2 MG FILM, SMARTSIG:2.5 Strip(s) Sublingual Daily, Disp: , Rfl:    Glucagon  (GVOKE HYPOPEN  1-PACK) 1 MG/0.2ML SOAJ, Inject 1 mg into the skin as needed (low blood sugar with impaired consciousness)., Disp: 0.4 mL, Rfl: 2   glucose blood (FREESTYLE LITE) test strip, TEST BLOOD SUGAR DAILY, Disp: 100 strip, Rfl: 3   Insulin  Degludec (TRESIBA ) 100 UNIT/ML SOLN, Inject 40 Units into the skin daily. Through endocrine Dr.  Dartha, Disp: , Rfl:    Insulin  Pen Needle (B-D ULTRAFINE III SHORT PEN) 31G X 8 MM MISC, USE TO INJECT DAILY AS DIRECTED, Disp: 100 each, Rfl: 3   losartan -hydrochlorothiazide (HYZAAR) 100-25 MG tablet, TAKE 1 TABLET BY MOUTH DAILY, Disp: 90 tablet, Rfl: 3   metFORMIN  (GLUCOPHAGE -XR) 500 MG 24 hr tablet, TAKE 2 TABLETS(1000 MG) BY MOUTH TWICE DAILY WITH A MEAL, Disp: 180 tablet, Rfl: 3   omeprazole  (PRILOSEC) 40 MG capsule, TAKE 1 CAPSULE(40 MG) BY MOUTH DAILY, Disp: 90 capsule, Rfl: 3   pioglitazone  (ACTOS ) 30 MG tablet, Take 1 tablet (30 mg total) by mouth daily., Disp: 90 tablet, Rfl: 1   rosuvastatin  (CRESTOR ) 10 MG tablet, TAKE 1 TABLET(10 MG) BY MOUTH DAILY, Disp: 30 tablet, Rfl: 5   tirzepatide  (MOUNJARO ) 5 MG/0.5ML Pen, Inject 5 mg into the skin once a week., Disp: 2 mL, Rfl: 0   busPIRone  (BUSPAR ) 30 MG tablet, Take 1 tablet (30 mg total) by mouth 2 (two) times daily., Disp: 180 tablet, Rfl: 1   naloxone  (NARCAN ) nasal spray 4 mg/0.1 mL, Use as directed for overdose., Disp: 1 each, Rfl: 1 Medication Side Effects: none  Family Medical/ Social History: Changes?  No  MENTAL HEALTH EXAM:  There were no vitals taken for this visit.There is no height or weight on file to calculate BMI.  General Appearance: Casual, Neat and Well Groomed  Eye Contact:  Good  Speech:  Clear and Coherent and Normal Rate  Volume:  Normal  Mood:  Euthymic  Affect:  Congruent  Thought Process:  Goal Directed and Descriptions of Associations: Circumstantial  Orientation:  Full (Time, Place, and Person)  Thought Content: Logical   Suicidal Thoughts:  No  Homicidal Thoughts:  No  Memory:  WNL  Judgement:  Good  Insight:  Good  Psychomotor Activity:  Normal  Concentration:  Concentration: Good and Attention Span: Good  Recall:  Good  Fund of Knowledge: Good  Language: Good  Assets:  Communication Skills Desire for Improvement Financial  Resources/Insurance Housing Resilience Transportation Vocational/Educational  ADL's:  Intact  Cognition: WNL  Prognosis:  Good   DIAGNOSES:    ICD-10-CM   1. GAD (generalized anxiety disorder)  F41.1     2. Panic disorder  F41.0      Receiving Psychotherapy: No   RECOMMENDATIONS:  PDMP was reviewed.  Valium  filled 07/21/2024.  Suboxone filled 07/24/2024. I provided approximately 20 minutes of face to face time during this encounter, including time spent before and after the visit in records review, medical decision making, counseling pertinent to today's visit, and charting.   We discussed the Valium .  I think he has built up a tolerance to it.  We could either change to Xanax  or Klonopin.  Pros and cons were discussed.  I recommend Xanax  specifically because it has a quicker onset of action.  If it is not effective he can call and we will try Klonopin.    Discontinue Valium . Start Xanax  1 mg, 1 p.o. twice daily as needed. Continue BuSpar  30 mg bid. Continue Suboxone per another provider. Return in 6 months.  Verneita Cooks, PA-C

## 2024-08-28 ENCOUNTER — Other Ambulatory Visit: Payer: Self-pay | Admitting: "Endocrinology

## 2024-09-06 ENCOUNTER — Encounter: Payer: Self-pay | Admitting: "Endocrinology

## 2024-09-06 ENCOUNTER — Telehealth: Admitting: "Endocrinology

## 2024-09-06 VITALS — Ht 69.6 in | Wt 226.0 lb

## 2024-09-06 DIAGNOSIS — Z7985 Long-term (current) use of injectable non-insulin antidiabetic drugs: Secondary | ICD-10-CM

## 2024-09-06 DIAGNOSIS — E119 Type 2 diabetes mellitus without complications: Secondary | ICD-10-CM

## 2024-09-06 DIAGNOSIS — E782 Mixed hyperlipidemia: Secondary | ICD-10-CM

## 2024-09-06 DIAGNOSIS — Z794 Long term (current) use of insulin: Secondary | ICD-10-CM | POA: Diagnosis not present

## 2024-09-06 DIAGNOSIS — Z7984 Long term (current) use of oral hypoglycemic drugs: Secondary | ICD-10-CM | POA: Diagnosis not present

## 2024-09-06 NOTE — Patient Instructions (Signed)
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
## 2024-09-27 ENCOUNTER — Other Ambulatory Visit: Payer: Self-pay | Admitting: "Endocrinology

## 2024-09-27 NOTE — Telephone Encounter (Signed)
 Requested Prescriptions   Pending Prescriptions Disp Refills   MOUNJARO  5 MG/0.5ML Pen [Pharmacy Med Name: MOUNJARO  5MG /0.5ML INJ (4 PENS)] 2 mL 0    Sig: ADMINISTER 5 MG UNDER THE SKIN 1 TIME A WEEK

## 2025-01-07 ENCOUNTER — Ambulatory Visit: Admitting: "Endocrinology

## 2025-02-07 ENCOUNTER — Ambulatory Visit: Admitting: Physician Assistant

## 2025-06-10 ENCOUNTER — Encounter: Admitting: Family Medicine
